# Patient Record
Sex: Female | Born: 1935 | Race: White | Hispanic: No | Marital: Married
Health system: Southern US, Community
[De-identification: ages and names within clinical notes are randomized; demographics above are authoritative.]

## PROBLEM LIST (undated history)

## (undated) DIAGNOSIS — N200 Calculus of kidney: Secondary | ICD-10-CM

## (undated) DIAGNOSIS — I5032 Chronic diastolic (congestive) heart failure: Secondary | ICD-10-CM

## (undated) DIAGNOSIS — T4145XA Adverse effect of unspecified anesthetic, initial encounter: Secondary | ICD-10-CM

## (undated) DIAGNOSIS — I712 Thoracic aortic aneurysm, without rupture, unspecified: Secondary | ICD-10-CM

## (undated) DIAGNOSIS — N390 Urinary tract infection, site not specified: Secondary | ICD-10-CM

## (undated) DIAGNOSIS — I739 Peripheral vascular disease, unspecified: Secondary | ICD-10-CM

## (undated) DIAGNOSIS — F32A Depression, unspecified: Secondary | ICD-10-CM

## (undated) DIAGNOSIS — T8859XA Other complications of anesthesia, initial encounter: Secondary | ICD-10-CM

## (undated) DIAGNOSIS — D649 Anemia, unspecified: Secondary | ICD-10-CM

## (undated) DIAGNOSIS — R112 Nausea with vomiting, unspecified: Secondary | ICD-10-CM

## (undated) DIAGNOSIS — E662 Morbid (severe) obesity with alveolar hypoventilation: Secondary | ICD-10-CM

## (undated) DIAGNOSIS — N1330 Unspecified hydronephrosis: Secondary | ICD-10-CM

## (undated) DIAGNOSIS — I219 Acute myocardial infarction, unspecified: Secondary | ICD-10-CM

## (undated) DIAGNOSIS — E038 Other specified hypothyroidism: Secondary | ICD-10-CM

## (undated) DIAGNOSIS — I10 Essential (primary) hypertension: Secondary | ICD-10-CM

## (undated) DIAGNOSIS — Z9889 Other specified postprocedural states: Secondary | ICD-10-CM

## (undated) DIAGNOSIS — G4730 Sleep apnea, unspecified: Secondary | ICD-10-CM

## (undated) DIAGNOSIS — G9340 Encephalopathy, unspecified: Secondary | ICD-10-CM

## (undated) DIAGNOSIS — E039 Hypothyroidism, unspecified: Secondary | ICD-10-CM

## (undated) DIAGNOSIS — F329 Major depressive disorder, single episode, unspecified: Secondary | ICD-10-CM

## (undated) DIAGNOSIS — J449 Chronic obstructive pulmonary disease, unspecified: Secondary | ICD-10-CM

## (undated) DIAGNOSIS — I2510 Atherosclerotic heart disease of native coronary artery without angina pectoris: Secondary | ICD-10-CM

## (undated) HISTORY — DX: Calculus of kidney: N20.0

## (undated) HISTORY — PX: CORONARY STENT PLACEMENT: SHX1402

## (undated) HISTORY — DX: Other specified hypothyroidism: E03.8

## (undated) HISTORY — DX: Hypothyroidism, unspecified: E03.9

## (undated) HISTORY — PX: TONSILLECTOMY: SUR1361

## (undated) HISTORY — DX: Encephalopathy, unspecified: G93.40

## (undated) HISTORY — DX: Thoracic aortic aneurysm, without rupture, unspecified: I71.20

## (undated) HISTORY — DX: Morbid (severe) obesity with alveolar hypoventilation: E66.2

## (undated) HISTORY — DX: Chronic obstructive pulmonary disease, unspecified: J44.9

## (undated) HISTORY — PX: APPENDECTOMY: SHX54

## (undated) HISTORY — DX: Anemia, unspecified: D64.9

## (undated) HISTORY — PX: REPLACEMENT TOTAL KNEE BILATERAL: SUR1225

## (undated) HISTORY — DX: Thoracic aortic aneurysm, without rupture: I71.2

---

## 1999-03-27 ENCOUNTER — Other Ambulatory Visit: Admission: RE | Admit: 1999-03-27 | Discharge: 1999-03-27 | Payer: Self-pay | Admitting: Cardiology

## 2000-12-25 ENCOUNTER — Encounter: Payer: Self-pay | Admitting: Specialist

## 2000-12-31 ENCOUNTER — Inpatient Hospital Stay (HOSPITAL_COMMUNITY): Admission: RE | Admit: 2000-12-31 | Discharge: 2001-01-08 | Payer: Self-pay | Admitting: Specialist

## 2001-01-03 ENCOUNTER — Encounter: Payer: Self-pay | Admitting: Specialist

## 2001-01-08 ENCOUNTER — Inpatient Hospital Stay (HOSPITAL_COMMUNITY)
Admission: RE | Admit: 2001-01-08 | Discharge: 2001-01-12 | Payer: Self-pay | Admitting: Physical Medicine & Rehabilitation

## 2001-12-04 ENCOUNTER — Inpatient Hospital Stay (HOSPITAL_COMMUNITY): Admission: EM | Admit: 2001-12-04 | Discharge: 2001-12-09 | Payer: Self-pay | Admitting: Emergency Medicine

## 2001-12-29 ENCOUNTER — Encounter (HOSPITAL_COMMUNITY): Admission: RE | Admit: 2001-12-29 | Discharge: 2002-03-29 | Payer: Self-pay | Admitting: Cardiology

## 2002-04-14 ENCOUNTER — Encounter (HOSPITAL_COMMUNITY): Admission: RE | Admit: 2002-04-14 | Discharge: 2002-07-13 | Payer: Self-pay | Admitting: Cardiology

## 2002-04-23 ENCOUNTER — Inpatient Hospital Stay (HOSPITAL_COMMUNITY): Admission: EM | Admit: 2002-04-23 | Discharge: 2002-04-24 | Payer: Self-pay | Admitting: Emergency Medicine

## 2002-04-23 ENCOUNTER — Encounter: Payer: Self-pay | Admitting: Emergency Medicine

## 2002-06-02 ENCOUNTER — Emergency Department (HOSPITAL_COMMUNITY): Admission: EM | Admit: 2002-06-02 | Discharge: 2002-06-02 | Payer: Self-pay | Admitting: Emergency Medicine

## 2002-08-08 ENCOUNTER — Encounter (HOSPITAL_COMMUNITY): Admission: RE | Admit: 2002-08-08 | Discharge: 2002-11-06 | Payer: Self-pay | Admitting: Cardiology

## 2002-11-08 ENCOUNTER — Encounter (HOSPITAL_COMMUNITY): Admission: RE | Admit: 2002-11-08 | Discharge: 2003-02-06 | Payer: Self-pay | Admitting: Cardiology

## 2003-02-08 ENCOUNTER — Encounter (HOSPITAL_COMMUNITY): Admission: RE | Admit: 2003-02-08 | Discharge: 2003-05-09 | Payer: Self-pay | Admitting: Cardiology

## 2003-05-11 ENCOUNTER — Encounter (HOSPITAL_COMMUNITY): Admission: RE | Admit: 2003-05-11 | Discharge: 2003-08-09 | Payer: Self-pay | Admitting: Cardiology

## 2003-08-10 ENCOUNTER — Encounter (HOSPITAL_COMMUNITY): Admission: RE | Admit: 2003-08-10 | Discharge: 2003-11-08 | Payer: Self-pay | Admitting: Cardiology

## 2003-09-10 ENCOUNTER — Inpatient Hospital Stay (HOSPITAL_COMMUNITY): Admission: RE | Admit: 2003-09-10 | Discharge: 2003-09-15 | Payer: Self-pay | Admitting: Specialist

## 2004-03-16 ENCOUNTER — Encounter (HOSPITAL_COMMUNITY): Admission: RE | Admit: 2004-03-16 | Discharge: 2004-06-14 | Payer: Self-pay | Admitting: Cardiology

## 2004-07-11 ENCOUNTER — Encounter (HOSPITAL_COMMUNITY): Admission: RE | Admit: 2004-07-11 | Discharge: 2004-10-09 | Payer: Self-pay | Admitting: Cardiology

## 2004-10-10 ENCOUNTER — Encounter (HOSPITAL_COMMUNITY): Admission: RE | Admit: 2004-10-10 | Discharge: 2005-01-02 | Payer: Self-pay | Admitting: Cardiology

## 2008-01-07 ENCOUNTER — Encounter: Admission: RE | Admit: 2008-01-07 | Discharge: 2008-01-07 | Payer: Self-pay | Admitting: Otolaryngology

## 2008-01-21 ENCOUNTER — Ambulatory Visit (HOSPITAL_COMMUNITY): Admission: RE | Admit: 2008-01-21 | Discharge: 2008-01-22 | Payer: Self-pay | Admitting: Otolaryngology

## 2010-01-10 ENCOUNTER — Encounter (HOSPITAL_BASED_OUTPATIENT_CLINIC_OR_DEPARTMENT_OTHER): Admission: RE | Admit: 2010-01-10 | Discharge: 2010-02-14 | Payer: Self-pay | Admitting: General Surgery

## 2010-04-30 ENCOUNTER — Encounter: Payer: Self-pay | Admitting: Internal Medicine

## 2010-08-22 NOTE — Op Note (Signed)
NAME:  Laura [name], SANCHO NO.:  [id number]   MEDICAL RECORD NO.:  [id number]          PATIENT TYPE:  AMB   LOCATION:  SDS                          FACILITY:  [facility]   PHYSICIAN:  Antony Contras, MD     DATE OF BIRTH:  01-23-1930   DATE OF PROCEDURE:  01/21/2008  DATE OF DISCHARGE:                               OPERATIVE REPORT   PREOPERATIVE DIAGNOSES:  1. Zenker diverticulum.  2. Dysphagia.   POSTOPERATIVE DIAGNOSES:  1. Zenker diverticulum.  2. Dysphagia.   PROCEDURE:  Endoscopic Zenker diverticulotomy.   SURGEON:  Excell Seltzer. Jenne Pane, MD   ANESTHESIA:  General endotracheal anesthesia.   COMPLICATIONS:  None.   INDICATIONS:  The patient is a 77 year old white female who has a  several-month history of a gurgling sound in her throat and dysphagia  with regurgitation.  A barium swallow demonstrated a Zenker diverticulum  and she presents to the operating room for endoscopic management.   FINDINGS:  The diverticulum was well-defined endoscopically with food  debris within it.  The common wall was divided and stapled.   DESCRIPTION OF PROCEDURE:  The patient was identified in the holding  room and informed consent having been obtained including discussion of  risks, benefits, alternatives, the patient brought to the operating  suite, and put on the operative table in supine position.  Anesthesia  was induced, and the patient was intubated by anesthesia team without  difficulty.  The patient was given intravenous antibiotics during the  case.  The eyes were taped closed and the bed was turned 90 degrees from  anesthesia.  A tooth guard was placed.  The Weerda bivalved laryngoscope  was then inserted into the mouth and used to expose the upper esophagus.  Food debris and saliva were encountered and were suctioned.  It was  difficult to identify the natural esophagus, so the Weerda scope  was  removed and exchanged for a cervical esophagus scope.  With this, the  pouch was easily seen, but again the  lumen of the esophagus was  difficult to identify.  Thus, it was exchanged for a 7 esophagoscope,  which easily demonstrated the natural lumen of the esophagus as well as  the pouch.  This helped with localization.  The Weerda laryngoscope was  then reinserted and used to isolate the common wall between the pouch  and the esophagus and the scope was then opened and placed in suspension  on the Mayo stand using a Lewy arm.  In order to aid in retraction, a 2-  0 silk suture was then placed through the common wall on either side  using an endoscopic alligator forceps and was then used as stay sutures  out through the scope.  The endoscopic stapler was then placed in  through the scope and over the common wall and was then discharged  dividing the common wall with line of staples.  An additional staple  cartridge was used to extend the incision inferiorly.  After this, the  region was suctioned, photographs were made before and after  the division.  The stay sutures  were then removed and the Weerda  laryngoscope taken out of suspension and carefully removed from  patient's mouth.  The tooth guard was then removed and the patient was  turned back to anesthesia for wake up.  She was extubated, and moved to  the recovery room in stable condition.      Antony Contras, MD  Electronically Signed     DDB/MEDQ  D:  01/21/2008  T:  01/21/2008  Job:  [id number]

## 2010-08-25 NOTE — H&P (Signed)
NAME:  Laura [name], Laura [name]                    ACCOUNT NO.:  [id number]   MEDICAL RECORD NO.:  [id number]                   PATIENT TYPE:  EMS   LOCATION:  MAJO                                 FACILITY:  [facility]   PHYSICIAN:  Darden Palmer., M.D.         DATE OF BIRTH:  1929/09/11   DATE OF ADMISSION:  04/23/2002  DATE OF DISCHARGE:                                HISTORY & PHYSICAL   REASON FOR ADMISSION:  1. Diaphoresis, chest discomfort.  2. Rule out myocardial infarction.   HISTORY:  The patient is a 77 year old female who has a previous history of  coronary artery disease.  She had an acute inferior myocardial infarction on  December 04, 2001 when she had an episode of prolonged chest discomfort with  radiation to the arm.  After arrival to the ER, she went into ventricular  fibrillation.  She had acute angioplasty of the right coronary artery with a  3.5-mm stent and required placement of a 5.0 stent in the distal vessel for  a thrombus.  She has done well since then and has been in the maintenance  rehab program but has not been able to lose any weight.  Last week, she had  an episode of dizziness when she stood up rapidly where she slumped to the  floor but did not have chest pain and did not have actual syncope.  She had  eaten some cheese this morning and was getting ready to go to the  maintenance rehab program when she had the onset of nausea and some mild  diaphoresis.  She did not have any of the previous chest discomfort or upper  tingling in her arms that she had with the previous MI, but became  frightened, took a nitroglycerin and had some relief of her symptoms.  EMS  was called, and she was noted to be somewhat tachycardic when they arrived.  She had some mild palpitations, was brought to the ER, and currently feels  well.  She had some ST depression noted on EKG and is admitted to rule out  MI.   PAST MEDICAL HISTORY:  Medical:  She has longstanding  hypertension.  She has  a prior history of hyperlipidemia and obesity.  She had an episode of  pneumonia as an outpatient.   Previous surgery:  Appendectomy, tonsillectomy, and left ankle fixation in  1996.  Knee surgery in September of 2002.   ALLERGIES:  ACE INHIBITORS cause cough.  She is intolerant to CODEINE.   CURRENT MEDICATIONS:  1. Aspirin 81 mg daily.  2. Plavix 75 mg daily.  3. Metoprolol 50 mg b.i.d.  4. Diovan 160 mg daily.  5. Lipitor daily.  6. Occasionally takes Zoloft 25 mg daily.   FAMILY HISTORY:  Father died of coronary disease at age 70.  Mother died at  age 2 with diabetes.   SOCIAL HISTORY:  Married with two adult children.  She volunteers at [employer].  Denies alcohol or tobacco abuse and is happily married.   REVIEW OF SYSTEMS:  She has been obese for several years.  She has had some  mild insomnia and some mild depression previously.  She has been unable to  lose any significant amounts of weight since she was previous.  She denies  any normal gastrointestinal problems, no GU problems.  Does have some mild  arthritis.  Other than noted above, the remainder of the review of systems  is unremarkable.   PHYSICAL EXAMINATION:  GENERAL:  She is a pleasant, obese female and is  currently in no acute distress.  VITAL SIGNS:  Her blood pressure is currently 140/80, her pulse is 80.  SKIN:  Her skin is warm and dry without mass or lesions.  ENT:  EOMI, PERRLA, sclerae clear, fundi unremarkable, pharynx negative.  NECK:  No masses, JVD, thyromegaly or bruits.  She is very obese.  LUNGS:  Clear to A&P.  CARDIAC EXAMINATION:  Normal S1, S2, no S3 or S4 or murmur.  ABDOMEN:  Soft, nontender, no masses, hepatosplenomegaly or aneurysm.  EXTREMITIES:  Distal pulses are 2+.  There is no edema.   A 12-lead ECG shows diffuse ST depression.  CBC showed a hemoglobin of 13.5,  hematocrit 40.1, white count of 8,200.  Stat did not show any significant   abnormalities.   IMPRESSION:  1. Diaphoresis and mild nausea, rule out ischemic equivalent, relieved with     nitroglycerin, rule out significant arrhythmias.  2. Coronary artery disease with previous inferior myocardial infarction in     August of 2003.  3. Hypertension.  4. Obesity.  5. Hyperlipidemia.   RECOMMENDATIONS:  1. Begin Lovenox.  2. Increase beta blocker to 100 b.i.d.  3. Rule out MI with serial cardiac enzymes.  4. Check follow-up EKG.                                               Darden Palmer., M.D.    WST/MEDQ  D:  04/23/2002  T:  04/23/2002  Job:  [id number]   cc:   Loraine Leriche A. Waynard Edwards, M.D.  [address]  [place]  [place] [address]  Fax: [phone]

## 2010-08-25 NOTE — Consult Note (Signed)
NAME:  Laura [name], Laura [name]                    ACCOUNT NO.:  [id number]   MEDICAL RECORD NO.:  [id number]                   PATIENT TYPE:  INP   LOCATION:  2001                                 FACILITY:  [facility]   PHYSICIAN:  Serena Colonel, M.D.                   DATE OF BIRTH:  [birth year]   DATE OF CONSULTATION:  12/04/2001  DATE OF DISCHARGE:                                   CONSULTATION   REASON FOR CONSULTATION:  Tongue swelling and hematoma.   HISTORY:  This is a 78 year old who was admitted earlier in the day  following an acute myocardial infarction, who has undergone successful  intervention and is currently on strong intravenous platelet inhibitors.  She was noted sometime after the procedure to have bruising and swelling of  her tongue and some ecchymosis of the submental area.  She does not recall  having bitten or fallen or hurt herself, but she was likely unconscious at  the time.  She is having no difficulty breathing or swallowing.   PHYSICAL EXAMINATION:  GENERAL:  She is a pleasant lady who appears to be in  no distress.  She is awake and alert, able to answer all questions with  clear speech.  She is having no difficulty breathing and describes no  difficulty swallowing.  HEENT:  Examination reveals some ecchymosis of the left submental area  without any swelling or firmness to suggest an expanding hematoma.  In the  oral cavity, there is what appears to be a bite wound of the dorsal aspect  of the tongue, mainly on the right side, with some denuded epithelium.  There is minimal swelling but significant ecchymosis.  The floor of the  mouth has mild ecchymosis but no signs of expanding hematoma such as  firmness or tenderness.  The oropharynx has normal appearance without any  evidence of swelling or obstruction.  The remainder of the examination is  unremarkable.   IMPRESSION:  Tongue wound/hematoma with small hematoma of the submental  skin.  I am not sure if  this is blood that settled from the tongue injury or  if she may have suffered a small trauma to the jaw area.  In either case,  there is no evidence of expanding hematoma and there is no bleeding.  The  denuded epithelium will eventually slough off of the dorsum of the tongue  and will eventually heal.  She is really not suffering any discomfort.   RECOMMENDATION:  I recommend to Dr. Georga Hacking that they go ahead and  resume the platelet-inhibiting medication.  Contact me if there is any  evidence of expansion or any difficulty breathing or swallowing.  Otherwise,  I suspect this will completely resolve on its own.  Serena Colonel, M.D.    Dorene Grebe  D:  12/05/2001  T:  12/08/2001  Job:  [id number]

## 2010-08-25 NOTE — Discharge Summary (Signed)
[facility]  Patient:    Laura [name], Laura [name] Visit Number: [id number] MRN: [id number]          Service Type: SUR Location: 4W 0482 01 Attending Physician:  Erasmo Leventhal Dictated by:   Ralene Bathe, P.A. Admit Date:  12/31/2000 Disc. Date: 01/08/01                             Discharge Summary  ADMISSION DIAGNOSES:  1. End-stage osteoarthritis, left knee.  2. Hyperlipidemia.  3. Obesity.  4. History of pneumonia.  5. Mild elevated blood pressures but no current medications.  DISCHARGE DIAGNOSES:  1. End-stage osteoarthritis, left knee.  2. Hyperlipidemia.  3. Obesity.  4. History of pneumonia.  5. Mild elevated blood pressures but no current medications.  6. Status post left total knee arthroplasty.  7. Postoperative nausea and vomiting.  8. Postoperative Achilles and ankle tendinitis.  OPERATION:  Left total knee arthroplasty -- surgeon is Dr. Benny Lennert and assistant, Dorie Rank, P.A. -- under general anesthesia.  BRIEF HISTORY:  The patient is a 77 year old female well-known to our practice.  She has end-stage left knee osteoarthritis and has failed conservative measures including arthroscopic washout and anti-inflammatories. At this time, she wishes to proceed with left total knee arthroplasty as indicated.  Risks and benefits were discussed in detail and she wishes to proceed.  HOSPITAL COURSE:  Patient was admitted and underwent the above-named procedure and tolerated this well.  All appropriate IV antibiotics and analgesics were utilized.  Postoperatively, she was put on DVT and PE prophylaxis with Coumadin.  Postoperatively, she had a significant amount of nausea and vomiting that lasted 48 to 72 hours and bowel regimen and precautions were utilized.  She progressed slowly and was able to be weaned to p.o.s by postoperative day 3 at full liquids and postoperative day 4 on solids.  She did have some mild confusion  related to narcotics.  She had also some significant bilateral ankle pain where she had previous left ankle fracture as well as some right ankle discomfort that were worked up and found to be probable tendinitis and soft tissue in nature.  She was begun on Bextra with good result and placed in an ASO to the left ankle for support.  All-in-all, she had a low postoperative course due to these complications, however, did extremely well.  It was felt that she would require further inpatient stay to increase her therapies and to stabilize medical conditions prior to discharge to home.  A rehab consult was ordered and she was felt to be an appropriate candidate.  On January 16, 2001, she was afebrile, her vital signs were stable and neurovascularly, she was intact.  Her knee incision was clean and dry without any drainage.  Her GI status had improved and she was tolerating pills, p.o.s and had positive bowel movements without difficulty.  She was voiding without difficulty.  All-in-all, she was stable for transfer to rehab unit to continue therapies.  LABORATORY DATA:  Urine culture:  No growth.  Pro times followed by pharmacy, on Coumadin.  Preoperative hemoglobin 14, postoperatively at 10.2 and 9.2. Chemistries within normal limits postoperatively on three lab draws. Urinalysis on admission with moderate leukocyte esterase, postoperatively trace, and another postoperative was a normal urine with a normal culture.  X-ray shows no acute bony abnormalities in the left ankle, minimal degenerative changes noted, plate and screw fixation also noted.  Preoperative  chest film showed cardiomegaly and tricompartmental arthritis of the operative knee.  EKG:  She had borderline EKG, normal sinus rhythm.  CONDITION ON DISCHARGE:  Stable and improved.  DISCHARGE PLAN:  Patient is being discharged to [facility] where they will continue to monitor her medications and her therapies.  She  is weightbearing as tolerated with left total knee replacement protocol.  CURRENT MEDICATIONS:  1. Colace 100 mg b.i.d.  2. Trinsicon one t.i.d.  3. Atenolol 50 mg q.d.  4. Pepcid 20 mg b.i.d.  5. Laxative and enema of choice.  6. Phenergan and Reglan p.r.n.  7. Coumadin per pharmacy.  8. Robaxin 500 mg q.8h. p.r.n.  9. Bextra 20 mg q.d. 10. Darvocet-N 100 one to two every four to six p.r.n. pain.  FOLLOWUP:  We will continue to follow her there p.r.n.  Please call for any problems. Dictated by:   Ralene Bathe, P.A. Attending Physician:  Erasmo Leventhal DD:  01/08/01 TD:  01/08/01 Job: [id number]

## 2010-08-25 NOTE — H&P (Signed)
NAME:  Laura [name], Laura [name]                    ACCOUNT NO.:  [id number]   MEDICAL RECORD NO.:  [id number]                   PATIENT TYPE:  EMS   LOCATION:  MAJO                                 FACILITY:  [facility]   PHYSICIAN:  W. Ashley Royalty., M.D.         DATE OF BIRTH:  [birth year]   DATE OF ADMISSION:  12/04/2001  DATE OF DISCHARGE:                                HISTORY & PHYSICAL   HISTORY OF PRESENT ILLNESS:  This 77 year old female is being evaluated for  an acute myocardial infarction. She has a prior history of hypertension and  possible hyperlipidemia and obesity.  She has previously been in fair health  except for surgery and hypertension and presented to Dr. Laurey Morale office  this morning with a 45 minute episode of prolonged chest discomfort with  radiation to the arm.  The symptoms were unrelieved in his office and she  was transferred to the [facility] Emergency Room by EMS.  After arrival  here, she went into ventricular fibrillation and required cardioversion.  She is in sinus rhythm now, is able to give her name and is complaining of  generalized pain after the cardioversion.  Most of the history was obtained  from the family except for that above because of the acute nature of the  situation.   PAST MEDICAL HISTORY:  This is remarkable for hypertension, possible  hyperlipidemia, and no known history of ulcers or diabetes.   PAST SURGICAL HISTORY:  She had left knee replacement, bilateral ankle  surgery, and appendectomy.   ALLERGIES:  CODEINE.   CURRENT MEDICATIONS:  These are unknown at this time.  Family does not know  them and we do not have records of current medications.   FAMILY HISTORY:  This is not able to be obtained at this time.   SOCIAL HISTORY:  She is a housewife, a nonsmoker, does not use alcohol to  excess, attends [employer] [address].  She is married and has at least two  daughters.   REVIEW OF SYSTEMS:  This is unobtainable at this  time.   PHYSICAL EXAMINATION:  GENERAL APPEARANCE:  She is an obese woman who is  mildly sedated following cardioversion but can give her name, complaining of  chest pain.  VITAL SIGNS:  Blood pressure is currently 120/80, pulse is currently 70 to  80 and regular.  SKIN:  Warm and dry.  There is a previous skin burn from the cardioversion  pad that the nurses had placed over the abdomen.  HEENT:  This is grossly normal.  Pharynx is negative.  NECK:  Supple without masses.  LUNGS:  The lungs are relatively clear.  CARDIOVASCULAR:  Normal S1 and S2, no S3.  ABDOMEN:  Soft and nontender, no hepatosplenomegaly grossly.  EXTREMITIES:  Peripheral pulses are 2+.   A 12-lead ECG shows an inferolateral ST elevation, ST depression in V2.    IMPRESSION:  1. Acute inferior myocardial infarction, complicated with early ventricular  fibrillation in the emergency room.  2. Hypertension.  3. Hyperlipidemia, possibly under treatment.  4. Obesity.  5. Osteoarthritis.   RECOMMENDATIONS:  The patient will be taken acutely to the cardiac  catheterization laboratory for possible catheterization angioplasty and  stenting.  The procedure was discussed fully with the patient's family  including risks of stroke, death, or recurrent arrhythmia and they are  willing to proceed.                                                Darden Palmer., M.D.    WST/MEDQ  D:  12/04/2001  T:  12/04/2001  Job:  [id number]   cc:   Loraine Leriche A. Perini, M.D.

## 2010-08-25 NOTE — Discharge Summary (Signed)
NAME:  Laura [name], Laura [name]                    ACCOUNT NO.:  [id number]   MEDICAL RECORD NO.:  [id number]                   PATIENT TYPE:  INP   LOCATION:  0467                                 FACILITY:  [facility]   PHYSICIAN:  Erasmo Leventhal, M.D.         DATE OF BIRTH:  1929-06-20   DATE OF ADMISSION:  09/10/2003  DATE OF DISCHARGE:  09/15/2003                                 DISCHARGE SUMMARY   ADMITTING DIAGNOSIS:  End-stage osteoarthritis, right knee.   DISCHARGE DIAGNOSIS:  End-stage osteoarthritis, right knee.   OPERATION:  Total knee arthroplasty, right knee.   BRIEF HISTORY:  This is a 77 year old lady with a history of osteoarthritis,  bilateral knees, with previous total knee arthroplasty of her left knee.  She has done very well from that, and now requests total knee arthroplasty  of the right knee due to failure of conservative measures to alleviate her  discomfort.  The surgery, risks, benefits, and aftercare were discussed in  detail with the patient, questions were invited and answered.  She had  medical clearance by Dr. Donnie Aho, her cardiologist, and Dr. Chilton Si, her  medical doctor, prior to surgery.  She is on Plavix and aspirin, and will  stop that 7 days prior to the surgery by their recommendation.   LABORATORY VALUES:  Admission CBC within normal limits.  Admission PT and  PTT showed the PT slightly low at 11.3, otherwise within normal limits.  Her  PT and INR was 21.2 with an INR of 24 by discharge.  Admission CMET showed  the glucose high at 125; otherwise normal.  She was mildly hyponatremic  during her postoperative period, but was corrected by discharge.  She ran  high sugars through her hospitalization.   COURSE IN THE HOSPITAL:  The patient tolerated the operative procedure well.  On the first postoperative day, vital signs were stable, she was afebrile,  hemoglobin and hematocrit were acceptable.  Dressing was dry.  Hemovac was  removed without  difficulty, and she was started on a CPM machine.  On the  second postoperative day, she continued to do well, vital signs were stable,  she was afebrile.  Hemoglobin and hematocrit were acceptable.  She was  slightly hyponatremic at 133.  Free water was restricted.  She was  discontinued on her PCA.  Foley was discontinued, and she was started on 81  mg aspirin until her Coumadin was therapeutic.  On the third postoperative  day, vital signs were stable.  She had a cough, and did have a history of  asthmatic bronchitis.  Otherwise, her vital signs were stable, she was  afebrile.  Dressing was changed.  The wound looked excellent.  The calves  were negative.  CBG's were coming down, and she was doing well.  Chest x-ray  was obtained due to her cough, and showed no active lung disease.  The  fourth postoperative day, the patient was feeling better but still had a  cough.  Vital signs were stable.  She was afebrile.  O2 saturations were 96.  Lungs had scattered rhonchi.  Bowel sounds were active.  Heart sounds were  normal.  Calves were negative.  Dressing was changed, and the wound was  benign.  Chest x-ray showed no active disease.  We increased her incentive  spirometry, decreased her narcotics, and asked the medical service to follow  her chest.  On the fifth postoperative day, she was feeling better with  still some slight congestion with vital signs stable, afebrile.  PT and INR  in the therapeutic range, and lungs sounding better with the wound benign.  The patient was discharged for follow up in the office.   CONDITION ON DISCHARGE:  Improved.   DISCHARGE MEDICATIONS:  1. Vicodin to use sparingly.  2. Robaxin.  3. Trinsicon.  4. Coumadin per pharmacy protocol.   DISCHARGE INSTRUCTIONS:  She is to do her home CPM, home physical therapy.   FOLLOW UP:  1. She is to see up back in 10 days.  2. Dr. Waynard Edwards in 10 days to 2 weeks.   She is to call if any problems or questions  arise.     Jaquelyn Bitter. Chabon, P.A.                   Erasmo Leventhal, M.D.    SJC/MEDQ  D:  09/22/2003  T:  09/22/2003  Job:  [id number]

## 2010-08-25 NOTE — Consult Note (Signed)
NAME:  Laura [name], Laura [name]                    ACCOUNT NO.:  [id number]   MEDICAL RECORD NO.:  [id number]                   PATIENT TYPE:  EMS   LOCATION:  MAJO                                 FACILITY:  [facility]   PHYSICIAN:  W. Ashley Royalty., M.D.         DATE OF BIRTH:  April 19, 1929   DATE OF CONSULTATION:  06/02/2002  DATE OF DISCHARGE:                                   CONSULTATION   EMERGENCY ROOM VISIT   REASON FOR CONSULTATION:  This 77 year old female is seen in the emergency  room following an episode of atrial fibrillation.  She had an acute inferior  MI December 04, 2001 with prolonged episode of chest discomfort with radiation  to the arm.  She had ventricular fibrillation in the ER and had acute  angioplasty with a 3.5 mm stent and required placement of a 5.0 stent in the  distal vessel for thrombus.  She has done well since that time but developed  nausea and diaphoresis while going to the maintenance rehab program and she  was brought to the emergency room and admitted to rule out an MI.  MI was  ruled out and she did well following that.  She was at church today  preparing a meal and noted that the room was very hot.  It happened to be  after a funeral that all this happened.  She then became nauseous, somewhat  diaphoretic, and took two nitroglycerin.  She felt like she might have to  vomit and EMS was called.  She had no chest pain at this time.  She was  brought to the emergency room and was found to be in rapid atrial  fibrillation and converted spontaneously back to normal sinus rhythm.  She  has a prior history of a recent upper respiratory infection and has been  treated with Advair as well as cough drops over the weekend.  She has not  been using any over-the-counter decongestants.   PAST MEDICAL HISTORY:  1. Longstanding hypertension.  2. Prior history of hyperlipidemia and obesity.  3. History of pneumonia as an outpatient.   PREVIOUS SURGERY:  1.  Appendectomy.  2. Tonsillectomy.  3. Ankle fixation.  4. Knee surgery in September 2002.   ALLERGIES:  ACE INHIBITOR causes cough.   CURRENT MEDICATIONS:  1. Aspirin daily.  2. Plavix 75 daily.  3. Metoprolol 100 in the morning, 50 in the evening.  4. Diovan 150 daily.  5. Lipitor daily.   FAMILY AND SOCIAL HISTORY:  Same as previous records.   PHYSICAL EXAMINATION:  GENERAL:  Pleasant obese female in no acute distress.  VITAL SIGNS:  Blood pressure is currently 130/80, pulse is currently 90.  SKIN:  Warm and dry.  HEENT:  EOMI, PERRLA, C&S clear.  Fundi unremarkable.  Pharynx negative.  NECK:  Supple without masses, JVD, thyromegaly, or bruits.  Significantly  obese.  LUNGS:  Clear to A&P.  CARDIAC:  Normal S1 and S2.  No S3 or murmur.  ABDOMEN:  Soft, nontender.  No masses, hepatosplenomegaly, or aneurysm.  Pulses 2+, no edema.   LABORATORY DATA:  A 12-lead ECG shows nonspecific S and T wave  abnormalities, sinus rhythm.   IMPRESSION:  1. Coronary artery disease with previous inferior myocardial infarction in     August 2003.  2. Atrial fibrillation - resolved.  3. Hypertension.  4. Obesity.   RECOMMENDATIONS:  Increase Lopressor to 100 mg twice daily.  She will likely  need to have an outpatient echocardiogram.  I will check a TSH on her.  She  may need to be anticoagulated with Coumadin.                                               Darden Palmer., M.D.    WST/MEDQ  D:  06/02/2002  T:  06/02/2002  Job:  [id number]   cc:   Loraine Leriche A. Waynard Edwards, M.D.  [address]  [place]  [place] [address]  Fax: [phone]

## 2010-08-25 NOTE — Discharge Summary (Signed)
NAME:  Laura [name], Laura [name]                    ACCOUNT NO.:  [id number]   MEDICAL RECORD NO.:  [id number]                   PATIENT TYPE:  INP   LOCATION:  2035                                 FACILITY:  [facility]   PHYSICIAN:  Darden Palmer., M.D.         DATE OF BIRTH:  06-07-1929   DATE OF ADMISSION:  04/23/2002  DATE OF DISCHARGE:  04/24/2002                                 DISCHARGE SUMMARY   FINAL DIAGNOSES:  1. Diaphoresis and atypical chest pain, myocardial infarction ruled out.  2. Coronary artery disease with previous stenting of the right coronary     artery.  3. History of hypertension.  4. Obesity.   HISTORY OF PRESENT ILLNESS:  This 77 year old female had an inferior  myocardial infarction December 04, 2001.  She had done well since that time  following acute angioplasty.  The week prior to admission she had an episode  of dizziness when she stood up rapidly where she slipped to the floor but  did not have chest pain or actual syncope.  She had eaten cheese the morning  of admission and was getting ready to go to the maintenance rehab program  when she had the onset of nausea and mild diaphoresis.  She did not have any  of her previous chest discomfort but became frightened, took the  nitroglycerin and had relief of her symptoms.  EMS was called and she was  somewhat tachycardic.  She was brought to the emergency room and felt well.  She was admitted to rule out an MI.  Please see the previously dictated  history and physical for the remainder of the details.   HOSPITAL COURSE:  Lab data on admission showed a hemoglobin of 13.5 and  hematocrit 40.1.  PT and PTT were normal.  Glucose 123, BUN 32, creatinine  0.8, calcium 10.6.  Hemoglobin the next morning was 13.1.  Her troponins  were all completely normal.  CPK was 110 with an MB of 6.5 and MB remained  slightly elevated.  Cholesterol panel showed a cholesterol of 200,  triglycerides 140, HDL 45, LDL 127.  She  was placed on Lovenox and had MI  ruled out. I thought her symptoms were somewhat atypical and it was thought  in light of her symptoms that we would discharge her and follow up as an  outpatient.  Her metoprolol was increased to 100 mg daily.   CONDITION ON DISCHARGE:  She was discharged in improved condition.   DISCHARGE MEDICATIONS:  1. Metoprolol 100 mg in the morning and 50 mg at night.  2. Plavix 75 mg daily.  3. Aspirin 81 mg daily.  4. Lipitor 10 mg daily.  5. Diovan 160 mg daily.    DISCHARGE INSTRUCTIONS:  She is to walk and call if there are further  problems.   FOLLOW UP:  We will see her in follow-up in one week.  Darden Palmer., M.D.    WST/MEDQ  D:  05/13/2002  T:  05/13/2002  Job:  [id number]   cc:   Loraine Leriche A. Waynard Edwards, M.D.  [address]  [place]  [place] [address]  Fax: [phone]

## 2010-08-25 NOTE — Cardiovascular Report (Signed)
NAME:  Laura [name], Laura [name]                    ACCOUNT NO.:  [id number]   MEDICAL RECORD NO.:  [id number]                   PATIENT TYPE:  INP   LOCATION:  1826                                 FACILITY:  [facility]   PHYSICIAN:  W. Ashley Royalty., M.D.         DATE OF BIRTH:  [birth year]   DATE OF PROCEDURE:  12/04/2001  DATE OF DISCHARGE:                              CARDIAC CATHETERIZATION   HISTORY OF PRESENT ILLNESS:  This 77 year old female has a history of  hypertension, hyperlipidemia and presented with an acute inferior MI with  the onset of chest pain approximately 10:30-11 a.m. She was seen at Dr.  Laurey Morale office and was given aspirin and taken to the [facility] Emergency Room.  She had a ventricular fibrillatory arrest in the emergency room and was  successfully resuscitated.  She was brought to the catheterization lab and  was stable.   DESCRIPTION OF PROCEDURE:  The patient was prepped and draped in the usual  manner. After Xylocaine anesthesia, a 7 French sheath was placed in the  right femoral artery percutaneously. A 6 French sheath was placed in the  right femoral vein. A 5 French bipolar pacing catheter was then placed in  the right ventricular apex. Angiograms were made using 6 French catheters  and a 30 cc ventriculogram was performed. She had minimal disease involving  the left coronary system and was found to have a total occlusion of the  right coronary artery with collaterals from the left coronary system.  Arrangements were then made to do angioplasty.  She had previously been  given heparin in the emergency room. ACT was 308. Integrilin was begun with  double bolus and infusion technique. Angioplasty equipment used is a 7  [place] JR4 guiding catheter, HTF-J guide wire which crossed the lesion  easily. The lesion was pre-dilated with a 2.5 x 15 mm CrossSail balloon. A  3.5 x 23 mm Zeta stent was then deployed in the mid right coronary artery  and dilated to 16  atmospheres. This resulted in complete expansion of the  lesion and a good angiographic result. Following this, however, was an area  of linear lucency and opacity, which looked like a dissection. I then  performed intravascular ultrasound which showed there to be no intimal  disruption, but rather a linear thrombosis that extended from the inferior  portion of the stent down into the distal vessel. After review of the IVUS  and after consideration, it was elected to place a second stent to try to  trap the thrombosis and reduce the chance of distal embolization. A 5 mm x  20 mm Express II Scimed stent was placed across the lesion and was inflated  to a nominal pressure, which resulted in obliteration of the angiographic  appearance of thrombosis and an excellent angiographic result. She tolerated  the procedure well without complications and the sheath was sutured in place  and she was returned to the coronary care unit  in stable condition.   HEMODYNAMIC DATA:  Aorta post-contrast 158/95, LV post-contrast 158/21.   ANGIOGRAPHIC DATA:   LEFT VENTRICULOGRAM:  Left ventriculogram performed in the 30-degree RAO  projection. Aortic valve was normal. The mitral valve is normal. The left  ventricle appears normal in size.  There was inferior hypokinesis which was  is severe present. Estimated EF was 50%.  Coronary arteries arise and  distribute normally.   The left main coronary artery appears normal.   Left anterior descending contains only scattered irregularities and extends  to the apex. A large diagonal branch is present which is free of disease.   The circumflex marginal branch has two marginal arteries that contain  scattered irregularities but is free of disease.   The right coronary artery is occluded in its proximal portion and fills by  collaterals from the left coronary system. Post-dilatation of the right  coronary artery reveals the vessel to be widely patent with TIMI grade  3  flow. The proximal stent is well deployed and the distal stent is now well  deployed with 0% residual stenosis. There is some narrowing from a smaller  stent into the larger stents as a result of the stent mismatch.   Intravascular ultrasound:  The distal vessel is relatively free of disease  and appears to be a 5.0 x 5.0 vessel. Then 15 mm distal to the previously  placed stent is an area of mobile thrombosis which is linear and originates  from the distal edge of the previous stent.  The previous stent is well  opposed with internal dimensions of approximately 4.0 x 4.0. This stent  appeared to be well opposed throughout its length.   IMPRESSION:  1. Successful percutaneous transluminal coronary angioplasty and stenting of     the right coronary artery with stenosis going from 100% to 0%.  2. Acute inferior infarction with inferior wall hypokinesis.     A. Minimal disease involving the left coronary system.                                                   Darden Palmer., M.D.    WST/MEDQ  D:  12/04/2001  T:  12/06/2001  Job:  [id number]   cc:   Loraine Leriche A. Perini, M.D.

## 2010-08-25 NOTE — Op Note (Signed)
NAME:  Laura [name], Laura [name]                    ACCOUNT NO.:  [id number]   MEDICAL RECORD NO.:  [id number]                   PATIENT TYPE:  INP   LOCATION:  0467                                 FACILITY:  [facility]   PHYSICIAN:  Erasmo Leventhal, M.D.         DATE OF BIRTH:  06-Nov-1929   DATE OF PROCEDURE:  09/10/2003  DATE OF DISCHARGE:                                 OPERATIVE REPORT   PREOPERATIVE DIAGNOSIS:  Right knee end-stage osteoarthritis with  significant valgus malalignment.   POSTOPERATIVE DIAGNOSIS:  Right knee end-stage osteoarthritis with  significant valgus malalignment.   PROCEDURE:  Right total knee arthroplasty.   SURGEON:  Elana Alm. Thomasena Edis, M.D.   ASSISTANT:  Jodene Nam, PA-C   ANESTHESIA:  General with femoral nerve block.   ESTIMATED BLOOD LOSS:  Less than 50 mL.   DRAINS:  Two mini Hemovac.   COMPLICATIONS:  None.   TOURNIQUET TIME:  1 hour and 30 minutes at 350 mmHg.   OPERATIVE IMPLANTS:  Osteonics components, Scorpion all cemented.  Sizes 7  femur, 7 tibia, 12 mm flex tibial insert, and a 26 mm patella.   OPERATIVE DETAILS:  The patient was counseled in the holding area, and  correct site was identified.  IV was started.  Antibiotics were given.  Chart was reviewed and signed appropriately.  Taken to the OR, placed under  general anesthesia.  Foley catheter placed without utilizing sterile  technique by the OR circulating nurse.  All extremities were well-padded and  bumped.  The right lower extremity was examined, 5-degree flexure  contracture.  She could flex to 130 degrees.  She was in marked valgus  malalignment.  She was elevated, prepped with DuraPrep, and all was draped  in a sterile fashion, exsanguinated with an Esmarch.  Tourniquet was  inflated to 350 mmHg.  A straight midline incision made through the skin and  subcutaneous tissue, small veins electrocoagulated.  Medial and lateral soft  tissue flaps were developed at the  appropriate level.  Medial parapatellar  arthrotomy was performed and a minimal of proximal tibial release was done  medially.  The patella was everted.  The knee was flexed.  She had end-stage  osteoarthritic changes, bone against bone and a significant defect in the  lateral tibial plateau.  Cruciate ligaments were resected.  The osteophytes  were removed, a starting hole made in the distal femur; canal was irrigated.  The effluent was clear.  Intramedullary awl was gently placed.  We chose a  10-degree valgus cut due to the fact she was in so much valgus  preoperatively and then took a 10 mm cut off the distal femur, and the  distal femur was found to be a size 7.  Rotational marks were made, and the  distal femur was cut to fit a size #7.  Medial and lateral menisci were  removed under direct visualization.  Geniculate vessels were coagulated.  The posterior neurovascular structures were tied  off and protected  throughout the entire case.  She was found to be a size 7 tibia.  Starting  hole was made.  Step reamer was utilized.  Intramedullary canal was  irrigated until the effluent was clear.  Intramedullary rod was gently  dropped into the canal carefully.  We chose a 0-degree slope.  We took a 2  mm based upon the defect on the lateral side which gave an excellent  resection of the proximal tibia.  Posteromedial and posterolateral  osteophytes were removed under direct visualization.  Femoral trochlea was  prepared in a standard fashion.  At this point in time, with the size 7  femur, size 7 tibia with a 10 mm flex insert with excellent range of motion;  however, she was tight laterally.  This was expected based at this point in  time the IT band was released partially off its insertion to recess the IT  band.  This helped, and then the popliteus was released off the femur and  laterally to the recess.  The collateral ligament was left intact.  At this  point in time, after removing  all the lateral osteophytes and removing some  of the soft tissue, she was well-balanced in flexion and extension and varus  and valgus stress.  Patellofemoral tracking was also the accomplished and  did not require a lateral release.  Rotation marks were made on the tibia,  and the Delta keel was performed in the standard fashion.  The patella was  found to be a size 26, reamed to the appropriate depth.  Locking holes were  made.  At this point in time, utilizing modern cement technique, all  components were cemented in place, size 7 tibia, size 7 femur, 26 patella.  After the cement had cured, we did 10 and 12 mm thick insert with a tibial  insert at this point in time were well-balanced in flexion and extension.  Patellofemoral tracking was anatomic.  Tibial trial was removed; excess  cement was removed, bone wax placed on exposed bony surfaces, and the final  12 mm tibial insert was tapped into position.  Knee was well-balanced, no  complications or problems.   The knee was then irrigated with antibiotic solution on closure.  Two mini  Hemovac drains were placed.  Sequential closure in layers done, arthrotomy  with Vicryl, subcu Vicryl, skin closed with subcu Monocryl suture.  Steri-  Strips were applied.  Drain was then hooked to suction.  Tourniquet was  deflated.  Normal circulation in the foot and ankle at the end of the case.   At this time, the femoral nerve block was then administered.  She will be  awakened from anesthesia.  She was given another gram of Ancef  intravenously, and the tourniquet was deflated.  She is awakened and taken  from the operating room to PACU in stable condition.   In the PACU, she will be carefully checked to make sure there is no  complication in her nerve function or difficulties due to the fact that she  did have the superior valgus deformity.  If there are, these will be addressed and be included by standard fashion.   To decrease surgical time  and help throughout this entire difficult  procedure, Mr. Brett Canales Chabon's assistance was needed.  Erasmo Leventhal, M.D.    RAC/MEDQ  D:  09/10/2003  T:  09/10/2003  Job:  [id number]

## 2010-08-25 NOTE — H&P (Signed)
[facility]  Patient:    Laura [name], Laura [name] Visit Number: [id number] MRN: [id number]          Service Type: Attending:  R. Valma Cava, M.D. Dictated by:   Irena Cords, P.A.-C. Adm. Date:  12/31/00                           History and Physical  DATE OF BIRTH:  06/25/1929. SOCIAL SECURITY NUMBER:  [id number].  CHIEF COMPLAINT:  Left knee pain.  HISTORY OF PRESENT ILLNESS:  Laura [name] is a 77 year old female who presents with complaints of persistent and worsening left knee pain.  She has had daily pain that is now interfering with some of her activities of daily living.  She underwent knee arthroscopy without relief in February.  She has also had a series of Synvisc injections, which have given her no relief as well.  She had tried cortisone injections as well as various anti-inflammatory medications without change.  Because of the persistent and worsening symptoms and failure of conservative measures, she has elected to undergo a total knee replacement.  REVIEW OF SYSTEMS:  She denies diplopia, blurred vision, or headaches.  No rhinorrhea, sore throat, or earache.  No chest pain, shortness of breath, or cough.  No abdominal pain, nausea, vomiting, diarrhea, or constipation.  No melena or bright red blood per rectum.  No dysuria, urinary frequency, or hematuria.  No numbness or tingling in her extremities.  PAST MEDICAL HISTORY:  Significant for elevated blood pressure, hyperlipidemia, obesity, and one episode of pneumonia that was treated as an outpatient in 2000.  Also of note, she becomes easily nauseated after anesthesia.  She denies a history of diabetes, heart, lung, kidney, or ulcer disease.  Denies stroke, seizures, or cancer.  ALLERGIES:  No known drug allergies.  She is intolerant to CODEINE.  MEDICATIONS:  Vicodin, Mobic (which she will be stopping before surgery), and she thinks they started her on HCTZ 12.5 mg a  day.  PAST SURGICAL HISTORY:  Appendectomy, tonsillectomy, and left ankle ORIF in 1996.  FAMILY HISTORY:  Significant in that her father died at age 34 of coronary artery disease and mother died at age 6 of diabetes.  SOCIAL HISTORY:  Patient is married, has two adult children, who are alive and well.  Patient has two steps leading into her home, and she lives on the main floor.  She volunteers at [employer].  She denies tobacco or alcohol use.  PHYSICAL EXAMINATION:  VITAL SIGNS:  Pulse 76, respiratory rate 12, blood pressure 120/80, weight 208 pounds.  GENERAL:  A well-developed, well-nourished female in no acute distress.  She walks with an antalgic gait with the assistance of a cane.  HEENT:  Head is atraumatic, normocephalic.  Pupils are equal, round and reactive to light, and extraocular movements are grossly intact.  Oropharynx is clear without redness, exudate, or lesions.  NECK:  Supple without cervical lymphadenopathy.  No carotid bruits.  CHEST:  Clear to auscultation bilaterally with no wheezes or crackles.  CARDIAC:  Regular rate and rhythm, no murmurs, rubs, or gallops.  ABDOMEN:  Soft, nontender, nondistended, with no masses and no hepatosplenomegaly.  BREASTS, GENITOURINARY:  Not examined, as not pertinent to present illness.  EXTREMITIES:  Left knee demonstrates a varus alignment.  She has a slight flexion contracture.  Flexes the knee to 120 degrees.  Diffusely tender along the medial joint space.  No collateral instability.  Motor and sensory and distal neurovascular exams are grossly intact.  2+ DP and PT pulses.  SKIN:  Intact without rashes or lesions.  LABORATORY DATA:  X-rays demonstrate significant bone-on-bone degenerative changes in the left knee in the medial compartment, most pronounced, as well as tricompartmental changes throughout that left knee.  ASSESSMENT: 1. Left knee osteoarthritis. 2. Elevated blood pressure. 3.  Hyperlipidemia. 4. Obesity.  PLAN:  Plan will be admitted to [facility] to undergo left total knee arthroplasty by Dr. Thomasena Edis on December 31, 2000.  We have received preoperative medical clearance from Dr. Waynard Edwards, patients medical physician, who felt she was medically cleared and stable for the surgery.  He noted that she may need possible control of her blood pressure and therefore has started her on a medication for this.  I have answered all questions for the patient today.  We have obtained new x-rays today for preop planning. Dictated by:   Irena Cords, P.A.-C. Attending:  R. Valma Cava, M.D. DD:  12/12/00 TD:  12/12/00 Job: [id number]

## 2010-08-25 NOTE — H&P (Signed)
NAME:  Laura [name], Laura [name]                    ACCOUNT NO.:  [id number]   MEDICAL RECORD NO.:  [id number]                   PATIENT TYPE:  INP   LOCATION:  NA                                   FACILITY:  [facility]   PHYSICIAN:  Erasmo Leventhal, M.D.         DATE OF BIRTH:  1929/09/29   DATE OF ADMISSION:  DATE OF DISCHARGE:                                HISTORY & PHYSICAL   ANTICIPATED DATE OF ADMISSION:  Preop History and Physical for surgery September 10, 2003.   CHIEF COMPLAINT:  Right knee osteoarthritis.   HISTORY OF PRESENT ILLNESS:  This is a 77 year old lady with a history of  osteoarthritis of the bilateral knees with previous total knee arthroplasty  of her left knee.  She has done very well from that.  Continues to have  significant pain and discomfort in her right knee with a severe valgus  deformity that has not responded to conservative management.  After  discussion of treatment options, risks and benefits of the surgery, she is  now scheduled for total knee replacement on the right knee.  She will have  medical clearance by Dr. Donnie Aho, her cardiologist, and Dr. Waynard Edwards, her  medical doctor prior to surgery.  She is on Plavix and aspirin and will stop  this 7 days prior to the surgery.  Questions are invited and answered.   PAST MEDICAL HISTORY:  Drug ALLERGY to CODEINE which causes nausea.   CURRENT MEDICATIONS:  1. Plavix 75 mg one daily.  2. Crestor 20 mg one daily.  3. Metoprolol 100 mg b.i.d.  4. Diovan 160 mg one daily.  5. Aspirin 81 mg one daily.   PREVIOUS SURGERIES:  1. Total knee arthroplasty, left knee.  2. Appendectomy.  3. Cardiac stent placement.   SERIOUS MEDICAL ILLNESSES:  1. Coronary artery disease.  2. MI.  3. Hypertension.  4. Bronchitis.  5. Asthma.   FAMILY HISTORY:  Negative.   SOCIAL HISTORY:  The patient is married.  She is retired.  She does not  smoke and drinks occasionally.  She lives with her husband.   REVIEW OF SYSTEMS:   CENTRAL NERVOUS SYSTEM:  Negative for a headache,  blurred vision, or dizziness.  PULMONARY:  Positive for history of asthma  and chronic bronchitis.  CARDIOVASCULAR:  Positive for a history of coronary  artery disease with MI.  Negative for angina.  She has had 2 stents placed.  GI:  Negative for ulcers or hepatitis.  GU:  Negative for urinary tract  difficulty.  MUSCULOSKELETAL:  Positive in the HPI.   PHYSICAL EXAMINATION:  GENERAL:  A well-developed, well-nourished obese lady  in no acute distress.  VITAL SIGNS:  BP 110/70, respirations 16, pulse 80 and regular.  HEENT:  Head normocephalic.  Nose patent.  Ears patent.  Pupils equal,  round, reactive to light.  Throat without injection.  NECK:  Supple without adenopathy.  Carotids 2+ without bruit.  CHEST:  Clear  to auscultation.  No rales or rhonchi.  Respirations 16.  HEART:  Regular rate and rhythm at 72 beats per minute without murmur.  ABDOMEN:  Obese and soft.  There are no masses or organomegaly.  Active  bowel sounds are noted.  NEUROLOGICAL:  The patient alert and oriented to time, place, and person.  Cranial nerves II-XII grossly intact.  EXTREMITIES:  Shows the right knee with about a 15 degree valgus deformity.  She has 0 to 120 degree range of motion.  Dorsalis pedis, posterior tibialis  pulses are 2+.   X-rays show end-stage osteoarthritis with a valgus knee on the right knee.   IMPRESSION:  End-stage osteoarthritis right knee.   PLAN:  Total knee arthroplasty of the right knee done under general  anesthesia so that we may evaluate her peroneal nerve postop.     Jaquelyn Bitter. Chabon, P.A.                   Erasmo Leventhal, M.D.    SJC/MEDQ  D:  08/30/2003  T:  08/30/2003  Job:  [id number]

## 2010-08-25 NOTE — Op Note (Signed)
[facility]  Patient:    Laura [name], Laura [name] Visit Number: [id number] MRN: [id number]          Service Type: [facility] Location: [id number] Attending Physician:  Faith Rogue T Dictated by:   R. Valma Cava, M.D. Proc. Date: 12/31/00 Admit Date:  01/08/2001 Discharge Date: 01/12/2001                             Operative Report  PREOPERATIVE DIAGNOSES:  Left knee end-stage osteoarthritis with associated osteonecrosis.  POSTOPERATIVE DIAGNOSES:  Left knee end-stage osteoarthritis with associated osteonecrosis.  OPERATION/PROCEDURE:  Left total knee arthroplasty.  SURGEON:  R. Valma Cava, M.D.  ASSISTANT:  Dorie Rank, P.A.C.  ANESTHESIA:  General, Dr. Shireen Quan.  ESTIMATED BLOOD LOSS:  Less than 50 cc.  DRAINS:  Tube and a Hemovac.  COMPLICATIONS:  None.  TOURNIQUET TIME:  1 hour and 50 minutes at 375 mmHg.  DISPOSITION:  To PACU, stable.  OPERATIVE IMPLANTS:  Osteonics components, all cemented.  Posterior stabilized.  Size 7 femur, size 7 tibia, 26 mm patella, 10 mm polyethylene insert.  OPERATIVE DETAILS:  The patient was evaluated in the holding area.  The chart was reviewed and signed appropriately.  IV had been started and antibiotics were given.  She was taken to the OR, placed in the supine position and general anesthesia.  A Foley catheter was placed utilizing sterile technique by the OR circulating nurse.  The left knee was examined.  She had a 10-degree flexion contracture.  She can flex to 110 degrees.  The leg was prepped and draped in a sterile fashion. Exsanguinated with Esmarch and tourniquet was inflated to 375 mmHg.  A midline incision made through the skin and subcutaneous tissue.  Small veins were electrocoagulated.  Medial and lateral soft tissue flaps were developed at appropriate level.  Medial parapatellar arthrotomy was performed and the medial soft tissue released at the proximal tibia.  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  The cruciate ligaments were resected.  A starting hole was made in the distal femur.  Canal was irrigated, intramedullary rod was gently placed.  We chose a 12 mm valgus cut off the distal femur base due to her flexion contracture. Distal femur was massaged, a size #7, rotation marks were made and distal femur was cut to fit a size #7.  Tibial ends were resected and osteophytes were removed from the proximal tibial.  I felt a border between her natural tibia and the medial osteophytes and made that clear with the rongeur.  The proximal tibia was found to be a size #7.  A starting hole was made, temporary aligned, and the canal was again irrigated.  The intramedullary rod was gently placed to make sure the canal was well decompressed.  A 5-degree posterior slide was chosen, alignment was sent and a 10-mm cut based on the lateral side was cut initially.  This gave a nice cut with good bone on both the medial and lateral sides.  Preoperatively I had available components to do a stemmed tibia and with a wedge on the medial side, if necessary, and it was not necessary.  Posteromedial and  posterolateral femoral osteophytes were under direct visualization, and the femoral trochlea was repaired in the standard fashion.  At this point in time, the trials of the #7 femur, #7 tibia, 10-mm insert we had excellent range of motion,  alignment, and soft tissue balance.  Rotation was made in the proximal tibia and the delta keel was performed in a standard fashion.  Tibia was found to be a size 26.  It was 24 mm in thickness.  Osteophytes were removed and it was reamed for a depth of 10 mm and locking holes were made. At this point in time, utilizing Modern cement technique all components were cemented into place:  Size #7 tibia, size #7 femur with a 26-mm patella. With the 10 mm trial we had excellent range of motion and  soft tissue balance. Patella tracking was lateral.  Lateral edges were formed giving anatomic telephone tracking.  The tibial trial was removed.  Excess cement was removed and then a final component of 10-mm tibial instrument was placed.  During this closure we irrigated antibiotic solution during each layer of the closure.  Two Hemovac drains were placed.  A sequential closure of layers were done.  Synovium Vicryl, arthrotomy Vicryl, subcu Vicryl, skin closed with subcuticular Monopril suture.  Benzoin and Steri-Strips were applied. 20% of 1/2% Marcaine to the joint after surgery.  Sterile compression dressing was applied.  Tourniquet was deflated with normal pulses and circulation of the foot and ankle were in the case.  She was given 1 g of Ancef intravenously Ice pack and knee immobilizer was applied in full extension.  Sponge and needle count were correct.  There were no complications.  She was gently awakened and taken from the operating room to the PACU in stable condition. Dictated by:   R. Valma Cava, M.D. Attending Physician:  Faith Rogue T DD:  12/31/00 TD:  12/31/00 Job: [id number]

## 2010-08-25 NOTE — Discharge Summary (Signed)
[place]  Patient:    Laura [name], POL Visit Number: [id number] MRN: [id number]          Service Type: [facility] Location: [phone] Attending Physician:  Faith Rogue T Dictated by:   Nile Dear, PA Admit Date:  01/08/2001 Discharge Date: 01/12/2001   CC:         Rodrigo Ran, M.D.  Benny Lennert, M.D.   Discharge Summary  DISCHARGE DIAGNOSES:  1. Status post left total knee replacement.  2. Postoperative anemia.  3. Ankle tendonitis.  HISTORY OF PRESENT ILLNESS: [name] is a 77 year old female, with a history of dyslipidemia, OA left knee, with failure of conservative therapy.  She elected to undergo left total knee replacement on January 03, 2001 by Dr. Hayden Rasmussen.  Postoperatively she is weightbearing as tolerated and on Coumadin for DVT prophylaxis.  INR currently is 2.8 today.  She has had problems with nausea and vomiting secondary to ileus as well as confusion post surgery secondary to narcotics.  Also noted to have bilateral ankle pain probably secondary to tendonitis and Bextra was initiated on December 27, 2000.  Currently notes were unavailable at the time of admission; however, by last progress reports the patient had been modest assistance with transfers, minimal assistance with short distances.  PAST MEDICAL HISTORY:  1. Dyslipidemia.  2. Obesity.  3. Appendectomy.  4. Left ankle ORIF.  5. Increased blood pressure.  ALLERGIES: CODEINE.  SOCIAL HISTORY: The patient is married and lives in a two-level home with two steps at entry.  She was independent prior to admission.  Does not use any tobacco or alcohol.  HOSPITAL COURSE: [name] was admitted to rehab on January 08, 2001 for inpatient therapy to consist of PT/OT daily.  Past admission the patient was maintained on Coumadin for DVD prophylaxis.  Her nausea and vomiting had resolved and she was eating solids with a  regular diet without difficulty. She did complain of right ankle pain greater than left ankle and Bextra was used p.r.n. with improvement in her symptomatology.  Admission laboratories showed a hemoglobin of 9.3, hematocrit 27.7, WBC 11.0; platelets 540,000. Sodium 139, potassium 3.5, chloride 99, CO2 21, BUN 11, creatinine 0.8, glucose 108.  She was noted to have positive UA; however, was noted to have no symptomatology.  Urine culture done showed multiple species.  The patient has had no complaints of bowel or bladder during this stay.  Blood pressures have shown reasonable control.  At the time of discharge the patient was modified independent for self-care needs.  She was modified independent for transfers, modified independent for ambulating 150 feet with a rolling walker.  Knee flexion was at 74 degrees.  The patient is to continue with follow-up with PT/OT past discharge.  Home health R.N. was arranged to draw pro time.  The patient is to continue on Coumadin through January 30, 2001.  On January 12, 2001 the patient was discharged home.  DISCHARGE MEDICATIONS:  1. Tenormin 50 mg q.d.  2. Pepcid 20 mg b.i.d.  3. Trinsicon one p.o. b.i.d.  4. Senokot-S two q.h.s.  5. Coumadin 4 mg q.d.  6. Darvocet-N 100 one to two q.4h to q.6h p.r.n. pain.  7. Bextra 20 mg q.d.  DISCHARGE ACTIVITY: Use walker.  WOUND CARE: Keep the area clean and dry, ice to left knee and ankle t.i.d. at 30 minute intervals.  SPECIAL INSTRUCTIONS: She is to continue Coumadin through January 30, 2001 and home  health R.N. to draw pro time check on Monday.  FOLLOW-UP: The patient is to follow up with Dr. Hayden Rasmussen in two to three weeks and follow up with Dr. Riley Kill as needed. Dictated by:   Nile Dear, PA Attending Physician:  Faith Rogue T DD:  02/12/01 TD:  02/14/01 Job: [id number]

## 2010-08-25 NOTE — Discharge Summary (Signed)
NAME:  Laura [name], Laura [name]                    ACCOUNT NO.:  [id number]   MEDICAL RECORD NO.:  [id number]                   PATIENT TYPE:  INP   LOCATION:  2001                                 FACILITY:  [facility]   PHYSICIAN:  Darden Palmer., M.D.         DATE OF BIRTH:  March 12, 1930   DATE OF ADMISSION:  12/04/2001  DATE OF DISCHARGE:  12/09/2001                                 DISCHARGE SUMMARY   FINAL DIAGNOSES:  1. Acute inferior myocardial infarction, initial episode with early     ventricular fibrillation.  2. Hypertension.  3. Dyslipidemia.  4. Obesity.  5. Osteoarthritis.   PROCEDURE:  Cardiac catheterization.   CONSULTATIONS:  Shawnie Dapper, M.D.   HISTORY OF PRESENT ILLNESS:  This 77 year old female has a history of  hypertension and presented with acute chest pain onset at around 10:30 a.m.  She was found to have an acute inferior infarction and was transferred to  [facility] emergency room.  After arrival in the emergency room,  she had a ventricular fibrillatory arrest and was cardioverted.  Then she  was taken to the cardiac catheterization laboratory for intervention.  Please see the previously dictated history and physical for remainder of the  details.   HOSPITAL COURSE:  The patient was admitted directly to the cardiac  catheterization lab.  Acute cardiac catheterization showed an EF of 50% with  inferior wall hypokinesis.  Left main coronary artery was normal, there was  minimal disease in the circumflex and LAD. The right coronary artery was  occluded proximally with collaterals in the left coronary artery. A stent  3.5 x 23 mm stent was deployed at 16 atmospheres in the mid right coronary  artery.  This was a Zeta stent.  Intravascular ultrasound done showed some  thrombus distal to the stent and a 5.0 x 20 mm Express II stent was then  placed around this thrombus with no residual filling defect.  There was good  stent apposition  afterward.  She was transferred back to the coronary unit  and was treated with Integrilin.  She developed a tongue and neck hematoma  where she had bit her tongue following the seizure that she had when she  arrested.  She had a glucose of 186 the next day and a potassium of 3.3.  Chemistry panel showed hemoglobin of 16 and fell to 11.1.  PT and PTT were  normal on admission.  Potassium was 3.3 initially and rose to 4.1.  Two  glucose were 186 and 146, BUN was 28 and fell to 14.  CPK rose to 3028 with  426 MB.  EKG revealed an acute inferior infarction. She was moved to the  floor, had no recurrence of chest pain, and was ambulated.  She was begun on  beta blockers and ACE inhibitors.  She was ready for discharge and was  discharged on December 09, 2001, on aspirin 81 mg daily, Plavix 75 mg daily,  Altace 10 mg daily, Lopressor 50 mg b.i.d., nitroglycerin 1/150 sublingual  as needed, Lipitor 10 mg daily, and Zoloft 25 mg daily. She is to follow up  in one week and is to call if there are problems.                                               Darden Palmer., M.D.   WST/MEDQ  D:  01/27/2002  T:  01/28/2002  Job:  [id number]   cc:   Loraine Leriche A. Perini, M.D.

## 2011-01-09 LAB — BASIC METABOLIC PANEL
Calcium: 8.8
Glucose, Bld: 117 — ABNORMAL HIGH

## 2011-01-09 LAB — CBC
Hemoglobin: 13.2
MCV: 90.7
RBC: 4.43
WBC: 7.1

## 2011-05-19 ENCOUNTER — Encounter (HOSPITAL_BASED_OUTPATIENT_CLINIC_OR_DEPARTMENT_OTHER): Payer: Self-pay | Admitting: *Deleted

## 2011-05-19 ENCOUNTER — Inpatient Hospital Stay (HOSPITAL_BASED_OUTPATIENT_CLINIC_OR_DEPARTMENT_OTHER)
Admission: EM | Admit: 2011-05-19 | Discharge: 2011-05-24 | DRG: 291 | Disposition: A | Discharge status: Home Health | Payer: Medicare Other | Attending: Internal Medicine | Admitting: Internal Medicine

## 2011-05-19 ENCOUNTER — Emergency Department (HOSPITAL_BASED_OUTPATIENT_CLINIC_OR_DEPARTMENT_OTHER): Payer: Medicare Other

## 2011-05-19 ENCOUNTER — Emergency Department (INDEPENDENT_AMBULATORY_CARE_PROVIDER_SITE_OTHER): Payer: Medicare Other

## 2011-05-19 ENCOUNTER — Other Ambulatory Visit: Payer: Self-pay

## 2011-05-19 DIAGNOSIS — E785 Hyperlipidemia, unspecified: Secondary | ICD-10-CM

## 2011-05-19 DIAGNOSIS — J441 Chronic obstructive pulmonary disease with (acute) exacerbation: Secondary | ICD-10-CM

## 2011-05-19 DIAGNOSIS — I5031 Acute diastolic (congestive) heart failure: Principal | ICD-10-CM | POA: Diagnosis present

## 2011-05-19 DIAGNOSIS — R5381 Other malaise: Secondary | ICD-10-CM

## 2011-05-19 DIAGNOSIS — G4733 Obstructive sleep apnea (adult) (pediatric): Secondary | ICD-10-CM | POA: Diagnosis present

## 2011-05-19 DIAGNOSIS — R0902 Hypoxemia: Secondary | ICD-10-CM

## 2011-05-19 DIAGNOSIS — M810 Age-related osteoporosis without current pathological fracture: Secondary | ICD-10-CM | POA: Diagnosis present

## 2011-05-19 DIAGNOSIS — J449 Chronic obstructive pulmonary disease, unspecified: Secondary | ICD-10-CM | POA: Diagnosis present

## 2011-05-19 DIAGNOSIS — I509 Heart failure, unspecified: Secondary | ICD-10-CM

## 2011-05-19 DIAGNOSIS — I119 Hypertensive heart disease without heart failure: Secondary | ICD-10-CM | POA: Diagnosis present

## 2011-05-19 DIAGNOSIS — I2510 Atherosclerotic heart disease of native coronary artery without angina pectoris: Secondary | ICD-10-CM | POA: Diagnosis present

## 2011-05-19 DIAGNOSIS — N390 Urinary tract infection, site not specified: Secondary | ICD-10-CM | POA: Diagnosis present

## 2011-05-19 DIAGNOSIS — I252 Old myocardial infarction: Secondary | ICD-10-CM

## 2011-05-19 DIAGNOSIS — R0602 Shortness of breath: Secondary | ICD-10-CM

## 2011-05-19 DIAGNOSIS — J4489 Other specified chronic obstructive pulmonary disease: Secondary | ICD-10-CM | POA: Diagnosis present

## 2011-05-19 DIAGNOSIS — R0689 Other abnormalities of breathing: Secondary | ICD-10-CM | POA: Diagnosis present

## 2011-05-19 DIAGNOSIS — J9600 Acute respiratory failure, unspecified whether with hypoxia or hypercapnia: Secondary | ICD-10-CM | POA: Diagnosis present

## 2011-05-19 DIAGNOSIS — I10 Essential (primary) hypertension: Secondary | ICD-10-CM | POA: Diagnosis present

## 2011-05-19 DIAGNOSIS — Z9861 Coronary angioplasty status: Secondary | ICD-10-CM

## 2011-05-19 HISTORY — DX: Acute myocardial infarction, unspecified: I21.9

## 2011-05-19 HISTORY — DX: Essential (primary) hypertension: I10

## 2011-05-19 HISTORY — DX: Atherosclerotic heart disease of native coronary artery without angina pectoris: I25.10

## 2011-05-19 LAB — BASIC METABOLIC PANEL
BUN: 26 mg/dL — ABNORMAL HIGH (ref 6–23)
CO2: 28 mEq/L (ref 19–32)
Calcium: 9.4 mg/dL (ref 8.4–10.5)
Chloride: 95 mEq/L — ABNORMAL LOW (ref 96–112)
GFR calc Af Amer: >90 mL/min (ref >90)
GFR calc non Af Amer: 80 mL/min — ABNORMAL LOW (ref >90)

## 2011-05-19 LAB — CBC
MCHC: 32.3 g/dL (ref 30.0–36.0)
MCV: 82.1 fL (ref 78.0–100.0)
Platelets: 160 10*3/uL (ref 150–400)
RBC: 5.43 MIL/uL — ABNORMAL HIGH (ref 3.87–5.11)
WBC: 9 10*3/uL (ref 4.0–10.5)

## 2011-05-19 LAB — PRO B NATRIURETIC PEPTIDE: Pro B Natriuretic peptide (BNP): 13423 pg/mL — ABNORMAL HIGH (ref 0–450)

## 2011-05-19 LAB — CARDIAC PANEL(CRET KIN+CKTOT+MB+TROPI)
CK, MB: 8.8 ng/mL (ref 0.3–4.0)
Relative Index: INVALID (ref 0.0–2.5)
Total CK: 97 U/L (ref 7–177)

## 2011-05-19 MED ORDER — NITROGLYCERIN 0.4 MG SL SUBL
0.4000 mg | SUBLINGUAL_TABLET | Freq: Once | SUBLINGUAL | Status: AC
  Administered 2011-05-19: 0.4 mg via SUBLINGUAL
  Filled 2011-05-19: qty 25

## 2011-05-19 MED ORDER — ALBUTEROL SULFATE (5 MG/ML) 0.5% IN NEBU
5.0000 mg | INHALATION_SOLUTION | Freq: Once | RESPIRATORY_TRACT | Status: AC
  Administered 2011-05-19: 5 mg via RESPIRATORY_TRACT

## 2011-05-19 MED ORDER — POTASSIUM CHLORIDE CRYS ER 20 MEQ PO TBCR
20.0000 meq | EXTENDED_RELEASE_TABLET | Freq: Two times a day (BID) | ORAL | Status: AC
  Administered 2011-05-20 (×2): 20 meq via ORAL
  Filled 2011-05-19 (×2): qty 1

## 2011-05-19 MED ORDER — ACETAMINOPHEN 325 MG PO TABS
650.0000 mg | ORAL_TABLET | ORAL | Status: DC | PRN
  Administered 2011-05-22 – 2011-05-23 (×3): 650 mg via ORAL
  Filled 2011-05-19 (×4): qty 2

## 2011-05-19 MED ORDER — ALBUTEROL SULFATE (5 MG/ML) 0.5% IN NEBU
INHALATION_SOLUTION | RESPIRATORY_TRACT | Status: AC
  Administered 2011-05-19: 5 mg via RESPIRATORY_TRACT
  Filled 2011-05-19: qty 1

## 2011-05-19 MED ORDER — SODIUM CHLORIDE 0.9 % IV SOLN
Freq: Once | INTRAVENOUS | Status: AC
  Administered 2011-05-19: 19:00:00 via INTRAVENOUS

## 2011-05-19 MED ORDER — HYDRALAZINE HCL 20 MG/ML IJ SOLN
10.0000 mg | Freq: Once | INTRAMUSCULAR | Status: AC
  Administered 2011-05-19: 10 mg via INTRAVENOUS
  Filled 2011-05-19: qty 1

## 2011-05-19 MED ORDER — ENOXAPARIN SODIUM 40 MG/0.4ML ~~LOC~~ SOLN
40.0000 mg | SUBCUTANEOUS | Status: DC
  Administered 2011-05-20 – 2011-05-24 (×5): 40 mg via SUBCUTANEOUS
  Filled 2011-05-19 (×6): qty 0.4

## 2011-05-19 MED ORDER — SODIUM CHLORIDE 0.9 % IV SOLN
250.0000 mL | INTRAVENOUS | Status: DC | PRN
  Administered 2011-05-20: 250 mL via INTRAVENOUS

## 2011-05-19 MED ORDER — ONDANSETRON HCL 4 MG/2ML IJ SOLN: 4.0000 mg | Freq: Four times a day (QID) | INTRAMUSCULAR | Status: DC | PRN

## 2011-05-19 MED ORDER — SODIUM CHLORIDE 0.9 % IJ SOLN
3.0000 mL | INTRAMUSCULAR | Status: DC | PRN
  Administered 2011-05-20: 3 mL via INTRAVENOUS

## 2011-05-19 MED ORDER — SODIUM CHLORIDE 0.9 % IJ SOLN
3.0000 mL | Freq: Two times a day (BID) | INTRAMUSCULAR | Status: DC
  Administered 2011-05-20 – 2011-05-23 (×7): 3 mL via INTRAVENOUS

## 2011-05-19 MED ORDER — IPRATROPIUM BROMIDE 0.02 % IN SOLN
0.5000 mg | Freq: Once | RESPIRATORY_TRACT | Status: AC
  Administered 2011-05-19: 0.5 mg via RESPIRATORY_TRACT

## 2011-05-19 MED ORDER — METHYLPREDNISOLONE SODIUM SUCC 125 MG IJ SOLR
125.0000 mg | Freq: Once | INTRAMUSCULAR | Status: AC
  Administered 2011-05-19: 125 mg via INTRAVENOUS
  Filled 2011-05-19: qty 2

## 2011-05-19 MED ORDER — IPRATROPIUM BROMIDE 0.02 % IN SOLN
RESPIRATORY_TRACT | Status: AC
  Administered 2011-05-19: 0.5 mg via RESPIRATORY_TRACT
  Filled 2011-05-19: qty 2.5

## 2011-05-19 MED ORDER — FUROSEMIDE 10 MG/ML IJ SOLN
60.0000 mg | Freq: Once | INTRAMUSCULAR | Status: AC
  Administered 2011-05-19: 60 mg via INTRAVENOUS
  Filled 2011-05-19: qty 6

## 2011-05-19 MED ORDER — FUROSEMIDE 10 MG/ML IJ SOLN
40.0000 mg | Freq: Two times a day (BID) | INTRAMUSCULAR | Status: DC
  Administered 2011-05-20 (×3): 40 mg via INTRAVENOUS
  Filled 2011-05-19 (×6): qty 4

## 2011-05-19 NOTE — ED Notes (Signed)
Xray at bedside for portable chest 

## 2011-05-19 NOTE — ED Notes (Signed)
Pt reports sob since Tuesday- saw pcp on Tuesday was given breathing tx in the office and albuterol inhaler- pt reports she has used up the inhaler and came to ED per family

## 2011-05-19 NOTE — ED Provider Notes (Signed)
History     CSN: [id number]  Arrival date & time 05/19/11  1716   First MD Initiated Contact with Patient 05/19/11 1726      Chief Complaint  Patient presents with  . Shortness of Breath   HPI History provided by the patient and her family.  Laura [name] says she has been having shortness of breat since Tuesday.  She was seen at her PCP's office that day and given a breathing treatment with albuterol and sent home with prednisone and albuterol and hydrocodone.  She has not improved.  Over the past few days, her shortness of breat has gotten worse.  She says she also has a cough, but has not been able to cough anything up.  She denies fevers, chills, chest pain.    Past Medical History  Diagnosis Date  . Coronary artery disease   . Myocardial infarction   . Hypertension   . Arthritis   . Asthma   . Chronic bronchitis     Past Surgical History  Procedure Date  . Coronary stent placement   . Appendectomy   . Tonsillectomy   . Knee surgery     Family History: Non-contributory  History  Substance Use Topics  . Smoking status: Never Smoker   . Smokeless tobacco: Never Used  . Alcohol Use: No    Review of Systems  Constitutional: Negative for fever.  HENT: Negative for rhinorrhea.   Eyes: Negative for visual disturbance.  Respiratory: Positive for cough, shortness of breath and wheezing.   Cardiovascular: Negative for chest pain and palpitations.  Gastrointestinal: Negative for nausea and abdominal pain.  Genitourinary: Negative for dysuria.  Musculoskeletal: Positive for arthralgias.  Skin: Negative for rash.  Neurological: Negative for dizziness.    Allergies  Codeine  Home Medications  No current outpatient prescriptions on file.  BP 205/91  Pulse 64  Temp(Src) 97.8 F (36.6 C) (Oral)  Resp 30  SpO2 98%  Physical Exam  Constitutional: She is oriented to person, place, and time. She appears well-developed and well-nourished. She appears distressed.  HENT:   Head: Normocephalic and atraumatic.  Eyes: EOM are normal. Pupils are equal, round, and reactive to light.  Neck: Normal range of motion. Neck supple. No JVD present.  Cardiovascular: Normal rate, regular rhythm and normal heart sounds.   Pulmonary/Chest: Tachypnea noted. She has wheezes. She has no rhonchi.  Abdominal: Soft. Bowel sounds are normal. There is no tenderness.  Musculoskeletal:       Patient has hyperpigmentation and scaling of lower extremities.  There is trace pitting edema.   Neurological: She is alert and oriented to person, place, and time.    ED Course  Procedures (including critical care time)  Labs Reviewed  CBC - Abnormal; Notable for the following:    RBC 5.43 (*)    All other components within normal limits  PRO B NATRIURETIC PEPTIDE - Abnormal; Notable for the following:    Pro B Natriuretic peptide (BNP) 13423.0 (*)    All other components within normal limits  BASIC METABOLIC PANEL - Abnormal; Notable for the following:    Sodium 134 (*)    Chloride 95 (*)    Glucose, Bld 136 (*)    BUN 26 (*)    GFR calc non Af Amer 80 (*)    All other components within normal limits  CARDIAC PANEL(CRET KIN+CKTOT+MB+TROPI) - Abnormal; Notable for the following:    CK, MB 8.8 (*)    All other components within normal  limits   No results found.   Hypoxia secondary to bronchitis vs. CHF   MDM  Pt with wheezing, some improvement with albuterol and solumedrol Pt is hypertensive, some improvement with Hydralazine.  Will give Nitro and Lasix in setting of elevated BNP.  Will admit to [facility]         Ardyth Gal, MD 05/19/11 2149

## 2011-05-19 NOTE — ED Notes (Signed)
ckmb 8.8 reported to EDP [facility]

## 2011-05-19 NOTE — ED Notes (Signed)
Charge RN made aware of patients condition. RT at bedside to assess pt

## 2011-05-20 ENCOUNTER — Inpatient Hospital Stay (HOSPITAL_COMMUNITY): Payer: Medicare Other

## 2011-05-20 ENCOUNTER — Other Ambulatory Visit: Payer: Self-pay

## 2011-05-20 DIAGNOSIS — G4733 Obstructive sleep apnea (adult) (pediatric): Secondary | ICD-10-CM

## 2011-05-20 DIAGNOSIS — R0689 Other abnormalities of breathing: Secondary | ICD-10-CM | POA: Diagnosis present

## 2011-05-20 DIAGNOSIS — I119 Hypertensive heart disease without heart failure: Secondary | ICD-10-CM | POA: Diagnosis present

## 2011-05-20 DIAGNOSIS — R0602 Shortness of breath: Secondary | ICD-10-CM | POA: Diagnosis present

## 2011-05-20 DIAGNOSIS — I2510 Atherosclerotic heart disease of native coronary artery without angina pectoris: Secondary | ICD-10-CM

## 2011-05-20 DIAGNOSIS — R0902 Hypoxemia: Secondary | ICD-10-CM | POA: Diagnosis present

## 2011-05-20 DIAGNOSIS — E785 Hyperlipidemia, unspecified: Secondary | ICD-10-CM | POA: Clinically undetermined

## 2011-05-20 LAB — CARDIAC PANEL(CRET KIN+CKTOT+MB+TROPI)
CK, MB: 8.2 ng/mL (ref 0.3–4.0)
CK, MB: 5.1 ng/mL — ABNORMAL HIGH (ref 0.3–4.0)
Relative Index: INVALID (ref 0.0–2.5)
Total CK: 80 U/L (ref 7–177)
Total CK: 57 U/L (ref 7–177)
Troponin I: <0.3 ng/mL (ref <0.30)
Troponin I: <0.3 ng/mL (ref <0.30)

## 2011-05-20 LAB — BLOOD GAS, ARTERIAL
Acid-Base Excess: 6.8 mmol/L — ABNORMAL HIGH (ref 0.0–2.0)
Acid-Base Excess: 7.2 mmol/L — ABNORMAL HIGH (ref 0.0–2.0)
Delivery systems: POSITIVE
Drawn by: 28340
O2 Content: 3 L/min
Patient temperature: 98.6
TCO2: 33.8 mmol/L (ref 0–100)
TCO2: 35.1 mmol/L (ref 0–100)
pCO2 arterial: 57.4 mmHg (ref 35.0–45.0)
pCO2 arterial: 65.9 mmHg (ref 35.0–45.0)
pH, Arterial: 7.322 — ABNORMAL LOW (ref 7.350–7.400)

## 2011-05-20 LAB — BASIC METABOLIC PANEL
CO2: 30 mEq/L (ref 19–32)
Chloride: 93 mEq/L — ABNORMAL LOW (ref 96–112)
Sodium: 138 mEq/L (ref 135–145)

## 2011-05-20 LAB — CBC
HCT: 47.5 % — ABNORMAL HIGH (ref 36.0–46.0)
MCH: 26.8 pg (ref 26.0–34.0)
MCHC: 31.8 g/dL (ref 30.0–36.0)
MCV: 84.2 fL (ref 78.0–100.0)
RDW: 14.9 % (ref 11.5–15.5)
WBC: 9.2 10*3/uL (ref 4.0–10.5)

## 2011-05-20 LAB — CREATININE, SERUM: GFR calc Af Amer: >90 mL/min (ref >90)

## 2011-05-20 LAB — MRSA PCR SCREENING: MRSA by PCR: NEGATIVE

## 2011-05-20 MED ORDER — VITAMIN D (ERGOCALCIFEROL) 1.25 MG (50000 UNIT) PO CAPS
50000.0000 [IU] | ORAL_CAPSULE | ORAL | Status: DC
  Administered 2011-05-21: 50000 [IU] via ORAL
  Filled 2011-05-20: qty 1

## 2011-05-20 MED ORDER — NITROGLYCERIN IN D5W 200-5 MCG/ML-% IV SOLN
5.0000 ug/min | INTRAVENOUS | Status: DC
  Administered 2011-05-20: 5 ug/min via INTRAVENOUS
  Filled 2011-05-20 (×2): qty 250

## 2011-05-20 MED ORDER — RALOXIFENE HCL 60 MG PO TABS
60.0000 mg | ORAL_TABLET | Freq: Every day | ORAL | Status: DC
  Administered 2011-05-20 – 2011-05-24 (×5): 60 mg via ORAL
  Filled 2011-05-20 (×5): qty 1

## 2011-05-20 MED ORDER — CEFUROXIME AXETIL 500 MG PO TABS
500.0000 mg | ORAL_TABLET | Freq: Two times a day (BID) | ORAL | Status: DC
  Administered 2011-05-20 (×2): 500 mg via ORAL
  Filled 2011-05-20 (×5): qty 1

## 2011-05-20 MED ORDER — GUAIFENESIN-DM 100-10 MG/5ML PO SYRP
15.0000 mL | ORAL_SOLUTION | ORAL | Status: DC | PRN
  Administered 2011-05-20 – 2011-05-23 (×7): 15 mL via ORAL
  Filled 2011-05-20: qty 15
  Filled 2011-05-20: qty 5
  Filled 2011-05-20 (×6): qty 15

## 2011-05-20 MED ORDER — PREDNISONE 10 MG PO TABS
10.0000 mg | ORAL_TABLET | Freq: Two times a day (BID) | ORAL | Status: DC
  Administered 2011-05-20 (×2): 10 mg via ORAL
  Filled 2011-05-20 (×5): qty 1

## 2011-05-20 MED ORDER — EZETIMIBE 10 MG PO TABS
10.0000 mg | ORAL_TABLET | Freq: Every day | ORAL | Status: DC
  Administered 2011-05-20 – 2011-05-24 (×5): 10 mg via ORAL
  Filled 2011-05-20 (×5): qty 1

## 2011-05-20 MED ORDER — IPRATROPIUM BROMIDE 0.02 % IN SOLN
0.5000 mg | RESPIRATORY_TRACT | Status: DC
  Administered 2011-05-20 – 2011-05-21 (×7): 0.5 mg via RESPIRATORY_TRACT
  Filled 2011-05-20 (×8): qty 2.5

## 2011-05-20 MED ORDER — ASPIRIN EC 81 MG PO TBEC
162.0000 mg | DELAYED_RELEASE_TABLET | Freq: Every day | ORAL | Status: DC
  Administered 2011-05-20 – 2011-05-24 (×5): 162 mg via ORAL
  Filled 2011-05-20 (×5): qty 2

## 2011-05-20 MED ORDER — METOPROLOL TARTRATE 25 MG PO TABS
25.0000 mg | ORAL_TABLET | Freq: Two times a day (BID) | ORAL | Status: DC
  Administered 2011-05-20 – 2011-05-24 (×10): 25 mg via ORAL
  Filled 2011-05-20 (×10): qty 1

## 2011-05-20 MED ORDER — ALBUTEROL SULFATE (5 MG/ML) 0.5% IN NEBU
2.5000 mg | INHALATION_SOLUTION | RESPIRATORY_TRACT | Status: DC
  Administered 2011-05-20 – 2011-05-21 (×7): 2.5 mg via RESPIRATORY_TRACT
  Filled 2011-05-20 (×8): qty 0.5

## 2011-05-20 MED ORDER — NITROGLYCERIN 0.2 MG/HR TD PT24
0.2000 mg | MEDICATED_PATCH | Freq: Every day | TRANSDERMAL | Status: DC
  Administered 2011-05-20 – 2011-05-24 (×6): 0.2 mg via TRANSDERMAL
  Filled 2011-05-20 (×6): qty 1

## 2011-05-20 NOTE — H&P (Addendum)
DATE OF ADMISSION:  05/20/2011  PCP:  Marveen Reeks    Chief Complaint:  Worsening SOB  HPI: Laura [name] is an 78 y.o. female who was taken to the [facility] ED due to worsening SOB and increased lethargy.  Patient has had worsening SOB over the past week along with Dyspnea on exertion.  She was placed on treatments by her PCP of Omnicef anitibiotic therapy and a prednisone taper along with inhalers for asthmatic bronchitis but her symptoms continued to worsen.  She reports not being able to lay flat or sleep because she can not breathe.  She denies having Chest pain.  She does report having a cough, abut she can not cough out any sputum.  She denies having fevers or chills.  She reports having orthopnea for months.  She also reports in the past being sent for a sleep study to evaluate for sleep apnea, but she was unable to sleep so the study was not able to be performed.    In the ED her laboratory studies revealed a Pro-BNP of 13,423, her chest X-rays, however  was inconclusive due to her positioning.  She reports having CAD and had an MI with stent placement 10 years ago, but she denies having a diagnosis of CHF and also denies having Sleep apnea.  Her husband and daughter are at bedside an report that she is hypersomnulent, and falls asleep often in the daytime, and snores.    Past Medical History  Diagnosis Date  . Coronary artery disease   . Myocardial infarction   . Hypertension   . Arthritis   . Asthma   . Chronic bronchitis     Past Surgical History  Procedure Date  . Coronary stent placement   . Appendectomy   . Tonsillectomy   . Knee surgery     Medications:  HOME MEDS: Prior to Admission medications   Medication Sig Start Date End Date Taking? Authorizing Provider  albuterol (PROVENTIL HFA;VENTOLIN HFA) 108 (90 BASE) MCG/ACT inhaler Inhale 2 puffs into the lungs every 4 (four) hours as needed. For cough or wheezing   Yes Historical Provider, MD  aspirin 81 MG  tablet Take 160 mg by mouth daily.   Yes Historical Provider, MD  cefdinir (OMNICEF) 300 MG capsule Take 300 mg by mouth 2 (two) times daily.   Yes Historical Provider, MD  chlorpheniramine-HYDROcodone (TUSSIONEX) 10-8 MG/5ML LQCR Take 5 mLs by mouth every 12 (twelve) hours as needed. For cough   Yes Historical Provider, MD  ezetimibe (ZETIA) 10 MG tablet Take 10 mg by mouth daily.   Yes Historical Provider, MD  METOPROLOL TARTRATE PO Take 1 tablet by mouth 2 (two) times daily.   Yes Historical Provider, MD  predniSONE (DELTASONE) 10 MG tablet Take 10 mg by mouth 2 (two) times daily.   Yes Historical Provider, MD  raloxifene (EVISTA) 60 MG tablet Take 60 mg by mouth daily.   Yes Historical Provider, MD  Simvastatin (ZOCOR PO) Take 1 tablet by mouth daily.   Yes Historical Provider, MD  Vitamin D, Ergocalciferol, (DRISDOL) 50000 UNITS CAPS Take 50,000 Units by mouth every 7 (seven) days.   Yes Historical Provider, MD    Allergies:  Allergies  Allergen Reactions  . Codeine Nausea And Vomiting  . Hydrocodone Other (See Comments)    delusions    Social History:   reports that she has never smoked. She has never used smokeless tobacco. She reports that she does not drink alcohol or  use illicit drugs.  Family History: No family history on file.  Review of Systems:  The patient denies anorexia, fever, weight loss, vision loss, decreased hearing, hoarseness, chest pain, syncope,  peripheral edema, balance deficits, hemoptysis, abdominal pain, melena, hematochezia, severe indigestion/heartburn, hematuria, incontinence, genital sores, muscle weakness, suspicious skin lesions, transient blindness, difficulty walking, depression, unusual weight change, abnormal bleeding, enlarged lymph nodes, angioedema, and breast masses.   Physical Exam:  GEN:  Somnulent tachypneic  Elderly 78 year old Caucasian female examined and in mild distress Filed Vitals:   05/19/11 2135 05/19/11 2218 05/19/11 2312  05/19/11 2350  BP: 138/66 125/89 161/82 133/89  Pulse:  71 74   Temp:   97.6 F (36.4 C)   TempSrc:   Oral   Resp: 30  22   Height:   5\' 5"  (1.651 m)   Weight:   94.6 kg (208 lb 8.9 oz)   SpO2: 92% 92% 94%    Blood pressure 133/89, pulse 74, temperature 97.6 F (36.4 C), temperature source Oral, resp. rate 22, height 5\' 5"  (1.651 m), weight 94.6 kg (208 lb 8.9 oz), SpO2 94.00%. PSYCH: She is somnulent but arousable and mildly confused but oriented x3; does not appear anxious does not appear depressed; affect is normal HEENT: Normocephalic and Atraumatic, Mucous membranes pink; PERRLA; EOM intact; Fundi:  Benign;  No scleral icterus, Nares: Patent, Oropharynx: Clear, Neck:  FROM, no cervical lymphadenopathy nor thyromegaly or carotid bruit; no JVD; Breasts:: Not examined CHEST WALL: No tenderness CHEST: Normal respiration, clear to auscultation bilaterally HEART: Regular rate and rhythm; no murmurs rubs or gallops BACK: No kyphosis or scoliosis; no CVA tenderness ABDOMEN: Positive Bowel Sounds, Obese, soft non-tender; no masses, no organomegaly, no pannus; no intertriginous candida. Rectal Exam: Not done EXTREMITIES: +Hyperpigmentation of BLEs (consistent with Venous Stasis Dermatitis)  Otherwise no cyanosis, clubbing or edema; no ulcerations. Genitalia: not examined PULSES: 2+ and symmetric SKIN: Normal hydration no rash or ulceration CNS: Cranial nerves 2-12 grossly intact no focal neurologic deficit   Labs & Imaging Results for orders placed during the hospital encounter of 05/19/11 (from the past 48 hour(s))  CBC     Status: Abnormal   Collection Time   05/19/11  6:36 PM      Component Value Range Comment   WBC 9.0  4.0 - 10.5 (K/uL)    RBC 5.43 (*) 3.87 - 5.11 (MIL/uL)    Hemoglobin 14.4  12.0 - 15.0 (g/dL)    HCT 16.1  09.6 - 04.5 (%)    MCV 82.1  78.0 - 100.0 (fL)    MCH 26.5  26.0 - 34.0 (pg)    MCHC 32.3  30.0 - 36.0 (g/dL)    RDW 40.9  81.1 - 91.4 (%)    Platelets 160   150 - 400 (K/uL)   PRO B NATRIURETIC PEPTIDE     Status: Abnormal   Collection Time   05/19/11  6:36 PM      Component Value Range Comment   Pro B Natriuretic peptide (BNP) 13423.0 (*) 0 - 450 (pg/mL)   BASIC METABOLIC PANEL     Status: Abnormal   Collection Time   05/19/11  6:36 PM      Component Value Range Comment   Sodium 134 (*) 135 - 145 (mEq/L)    Potassium 4.3  3.5 - 5.1 (mEq/L)    Chloride 95 (*) 96 - 112 (mEq/L)    CO2 28  19 - 32 (mEq/L)    Glucose, Bld  136 (*) 70 - 99 (mg/dL)    BUN 26 (*) 6 - 23 (mg/dL)    Creatinine, Ser 4.09  0.50 - 1.10 (mg/dL)    Calcium 9.4  8.4 - 10.5 (mg/dL)    GFR calc non Af Amer 80 (*) >90 (mL/min)    GFR calc Af Amer >90  >90 (mL/min)   CARDIAC PANEL(CRET KIN+CKTOT+MB+TROPI)     Status: Abnormal   Collection Time   05/19/11  6:36 PM      Component Value Range Comment   Total CK 97  7 - 177 (U/L)    CK, MB 8.8 (*) 0.3 - 4.0 (ng/mL)    Troponin I <0.30  <0.30 (ng/mL)    Relative Index RELATIVE INDEX IS INVALID  0.0 - 2.5    BLOOD GAS, ARTERIAL     Status: Abnormal   Collection Time   05/20/11 12:01 AM      Component Value Range Comment   O2 Content 3.0      Delivery systems NASAL CANNULA      pH, Arterial 7.322 (*) 7.350 - 7.400     pCO2 arterial 65.9 (*) 35.0 - 45.0 (mmHg)    pO2, Arterial 69.2 (*) 80.0 - 100.0 (mmHg)    Bicarbonate 33.1 (*) 20.0 - 24.0 (mEq/L)    TCO2 35.1  0 - 100 (mmol/L)    Acid-Base Excess 7.2 (*) 0.0 - 2.0 (mmol/L)    O2 Saturation 92.0      Patient temperature 98.6      Collection site RIGHT RADIAL      Drawn by [id number]      Sample type ARTERIAL DRAW      Allens test (pass/fail) PASS  PASS     Dg Chest Portable 1 View  05/19/2011  *RADIOLOGY REPORT*  Clinical Data: Short of breath  PORTABLE CHEST - 1 VIEW  Comparison: 01/16/2008  Findings: Suboptimal study due to rotation and underpenetration. The patient is large in size.  Cardiac enlargement without heart failure.  No definite pneumonia.  IMPRESSION: Limited  study.  Followup or repeat study is suggested.  No definite acute abnormality.  Original Report Authenticated By: Camelia Phenes, M.D.   EKG: not done  ABG:  7.32/ 65  /69 /33/ 92 %  Assessment: Present on Admission:  .Congestive heart failure (CHF) .Hypertension .Hypoxemia .SOB (shortness of breath) .CAD (coronary artery disease) .Hypercarbia Hyperlipidemia   Plan:          Admit to Stepdown BED  BiPAP IV Nitroglycerin drip CHF Protocol with IV Lasix BID x 2 days Nebs, O2 Cardiac Enzymes Reconcile Home Meds DVT prophylaxis Consulted PCCM Consider Outpatient evaluation for Obstructive Sleep Apnea EKG ordered and a repeat Chest X-ray in AM Other plans as per orders.    CODE STATUS:      FULL CODE       Critical care time: 60 minutes.     Linnae Rasool C 05/20/2011, 12:29 AM

## 2011-05-20 NOTE — ED Provider Notes (Signed)
I saw and evaluated the patient, reviewed the resident's note and I agree with the findings and plan.   .Face to face Exam:  General:  Awake HEENT:  Atraumatic Resp:  tachypneic Abd:  Nondistended Neuro:No focal weakness Lymph: No adenopathy   CRITICAL CARE Performed by: Nelva Nay L   Total critical care time: 30  Critical care time was exclusive of separately billable procedures and treating other patients.  Critical care was necessary to treat or prevent imminent or life-threatening deterioration.  Critical care was time spent personally by me on the following activities: development of treatment plan with patient and/or surrogate as well as nursing, discussions with consultants, evaluation of patient's response to treatment, examination of patient, obtaining history from patient or surrogate, ordering and performing treatments and interventions, ordering and review of laboratory studies, ordering and review of radiographic studies, pulse oximetry and re-evaluation of patient's condition.   Nelia Shi, MD 05/20/11 1031

## 2011-05-20 NOTE — Progress Notes (Signed)
[facility] [place] TEAM 8  Subjective: 78 y.o. female who was taken to the [facility] ED due to worsening SOB and increased lethargy.  Patient has had worsening SOB over the past week along with dyspnea on exertion. She was placed on Omnicef therapy and a prednisone taper along with inhalers for asthmatic bronchitis but her symptoms continued to worsen.  Objective: Weight change:   Intake/Output Summary (Last 24 hours) at 05/20/11 1113 Last data filed at 05/20/11 0930  Gross per 24 hour  Intake  192.5 ml  Output   2150 ml  Net -1957.5 ml   Blood pressure 140/68, pulse 64, temperature 97.6 F (36.4 C), temperature source Oral, resp. rate 20, height 5\' 5"  (1.651 m), weight 94.8 kg (208 lb 15.9 oz), SpO2 93.00%.  Physical Exam: F/U exam completed  Lab Results:  Basename 05/20/11 0520 05/20/11 0015 05/19/11 1836  NA 138 -- 134*  K 4.1 -- 4.3  CL 93* -- 95*  CO2 30 -- 28  GLUCOSE 142* -- 136*  BUN 27* -- 26*  CREATININE 0.77 0.69 0.70  CALCIUM 9.6 -- 9.4  MG -- -- --  PHOS -- -- --    Basename 05/20/11 0015 05/19/11 1836  WBC 9.2 9.0  NEUTROABS -- --  HGB 15.1* 14.4  HCT 47.5* 44.6  MCV 84.2 82.1  PLT 140* 160    Basename 05/20/11 0840 05/20/11 0013 05/19/11 1836  CKTOTAL 57 80 97  CKMB 5.9* 8.2* 8.8*  CKMBINDEX -- -- --  TROPONINI <0.30 <0.30 <0.30    Micro Results: Recent Results (from the past 240 hour(s))  MRSA PCR SCREENING     Status: Normal   Collection Time   05/19/11 11:19 PM      Component Value Range Status Comment   MRSA by PCR NEGATIVE  NEGATIVE  Final     Studies/Results: All recent x-ray/radiology reports have been reviewed in detail.   Medications: I have reviewed the patient's complete medication list.  Assessment/Plan:  Hypercapnic respiratory failure  Possible newly diagnosed CHF Markedly elevated pro-BNP at presentation  CAD s/p PTCA and stent  RML soft tissue density on CXR  HTN  Lonia Blood,  MD [facility] Office  [phone] Pager [phone]  On-Call/Text Page:      [email or web address]      password [facility]

## 2011-05-20 NOTE — Consult Note (Signed)
Name: Laura [name] MRN: [id number] DOB: 03/14/1930  DOS:  05/20/11     CRITICAL CARE MEDICINE ADMISSION / CONSULTATION NOTE  Referring Physician : Dr Lovell Sheehan  Reason For Consult : Shortness of breath.  History Of Present Illness :Laura [name] is an 78 y.o. female who was [facility] to the [facility] ED due to worsening SOB and increased lethargy. Patient has had worsening SOB over the past week along with Dyspnea on exertion. She was placed on treatments by her PCP of Omnicef anitibiotic therapy and a prednisone taper along with inhalers for asthmatic bronchitis but her symptoms continued to worsen. She reports not being able to lay flat or sleep because she can not breathe. She denies having Chest pain. She does report having a cough, abut she can not cough out any sputum. She denies having fevers or chills. She reports having orthopnea for months. She also reports in the past being sent for a sleep study to evaluate for sleep apnea, but she was unable to sleep so the study was not able to be performed.  In the ED her laboratory studies revealed a Pro-BNP of 13,423, her chest X-rays, however was inconclusive due to her positioning. She reports having CAD and had an MI with stent placement 10 years ago, but she denies having a diagnosis of CHF and also denies having Sleep apnea. Her husband and daughter are at bedside an report that she is hypersomnulent, and falls asleep often in the daytime, and snores.     Past Medical History  Diagnosis Date  . Coronary artery disease   . Myocardial infarction   . Hypertension   . Arthritis   . Asthma   . Chronic bronchitis     Past Surgical History  Procedure Date  . Coronary stent placement   . Appendectomy   . Tonsillectomy   . Knee surgery     Prior to Admission medications   Medication Sig Start Date End Date Taking? Authorizing Provider  albuterol (PROVENTIL HFA;VENTOLIN HFA) 108 (90 BASE) MCG/ACT inhaler Inhale 2 puffs into  the lungs every 4 (four) hours as needed. For cough or wheezing   Yes Historical Provider, MD  aspirin 81 MG tablet Take 160 mg by mouth daily.   Yes Historical Provider, MD  cefdinir (OMNICEF) 300 MG capsule Take 300 mg by mouth 2 (two) times daily.   Yes Historical Provider, MD  chlorpheniramine-HYDROcodone (TUSSIONEX) 10-8 MG/5ML LQCR Take 5 mLs by mouth every 12 (twelve) hours as needed. For cough   Yes Historical Provider, MD  ezetimibe (ZETIA) 10 MG tablet Take 10 mg by mouth daily.   Yes Historical Provider, MD  METOPROLOL TARTRATE PO Take 1 tablet by mouth 2 (two) times daily.   Yes Historical Provider, MD  predniSONE (DELTASONE) 10 MG tablet Take 10 mg by mouth 2 (two) times daily.   Yes Historical Provider, MD  raloxifene (EVISTA) 60 MG tablet Take 60 mg by mouth daily.   Yes Historical Provider, MD  Simvastatin (ZOCOR PO) Take 1 tablet by mouth daily.   Yes Historical Provider, MD  Vitamin D, Ergocalciferol, (DRISDOL) 50000 UNITS CAPS Take 50,000 Units by mouth every 7 (seven) days.   Yes Historical Provider, MD    Allergies  Allergen Reactions  . Codeine Nausea And Vomiting  . Hydrocodone Other (See Comments)    delusions    No family history on file.  Social History  reports that she has never smoked. She has never used smokeless  tobacco. She reports that she does not drink alcohol or use illicit drugs.  Review Of Systems  11 points review of systems is negative with an exception of listed in HPI.  Physical Examination  BP 133/89  Pulse 74  Temp(Src) 97.6 F (36.4 C) (Oral)  Resp 22  Ht 5\' 5"  (1.651 m)  Wt 94.6 kg (208 lb 8.9 oz)  BMI 34.71 kg/m2  SpO2 94%  Neuro:  AAOX3 HEENT: WNL  Heart:  RRR, no M/R/G Lungs:  Bilateral air entry, MILD wheezing + Abdomen:  Soft, non tender, bowel sounds present Extremities:  No cyanosis, clubbing or edema. Labs  Results for orders placed during the hospital encounter of 05/19/11 (from the past 24 hour(s))  CBC     Status:  Abnormal   Collection Time   05/19/11  6:36 PM      Component Value Range   WBC 9.0  4.0 - 10.5 (K/uL)   RBC 5.43 (*) 3.87 - 5.11 (MIL/uL)   Hemoglobin 14.4  12.0 - 15.0 (g/dL)   HCT 16.1  09.6 - 04.5 (%)   MCV 82.1  78.0 - 100.0 (fL)   MCH 26.5  26.0 - 34.0 (pg)   MCHC 32.3  30.0 - 36.0 (g/dL)   RDW 40.9  81.1 - 91.4 (%)   Platelets 160  150 - 400 (K/uL)  PRO B NATRIURETIC PEPTIDE     Status: Abnormal   Collection Time   05/19/11  6:36 PM      Component Value Range   Pro B Natriuretic peptide (BNP) 13423.0 (*) 0 - 450 (pg/mL)  BASIC METABOLIC PANEL     Status: Abnormal   Collection Time   05/19/11  6:36 PM      Component Value Range   Sodium 134 (*) 135 - 145 (mEq/L)   Potassium 4.3  3.5 - 5.1 (mEq/L)   Chloride 95 (*) 96 - 112 (mEq/L)   CO2 28  19 - 32 (mEq/L)   Glucose, Bld 136 (*) 70 - 99 (mg/dL)   BUN 26 (*) 6 - 23 (mg/dL)   Creatinine, Ser 7.82  0.50 - 1.10 (mg/dL)   Calcium 9.4  8.4 - 95.6 (mg/dL)   GFR calc non Af Amer 80 (*) >90 (mL/min)   GFR calc Af Amer >90  >90 (mL/min)  CARDIAC PANEL(CRET KIN+CKTOT+MB+TROPI)     Status: Abnormal   Collection Time   05/19/11  6:36 PM      Component Value Range   Total CK 97  7 - 177 (U/L)   CK, MB 8.8 (*) 0.3 - 4.0 (ng/mL)   Troponin I <0.30  <0.30 (ng/mL)   Relative Index RELATIVE INDEX IS INVALID  0.0 - 2.5   BLOOD GAS, ARTERIAL     Status: Abnormal   Collection Time   05/20/11 12:01 AM      Component Value Range   O2 Content 3.0     Delivery systems NASAL CANNULA     pH, Arterial 7.322 (*) 7.350 - 7.400    pCO2 arterial 65.9 (*) 35.0 - 45.0 (mmHg)   pO2, Arterial 69.2 (*) 80.0 - 100.0 (mmHg)   Bicarbonate 33.1 (*) 20.0 - 24.0 (mEq/L)   TCO2 35.1  0 - 100 (mmol/L)   Acid-Base Excess 7.2 (*) 0.0 - 2.0 (mmol/L)   O2 Saturation 92.0     Patient temperature 98.6     Collection site RIGHT RADIAL     Drawn by [id number]     Sample type ARTERIAL  DRAW     Allens test (pass/fail) PASS  PASS   CARDIAC PANEL(CRET KIN+CKTOT+MB+TROPI)      Status: Abnormal   Collection Time   05/20/11 12:13 AM      Component Value Range   Total CK 80  7 - 177 (U/L)   CK, MB 8.2 (*) 0.3 - 4.0 (ng/mL)   Troponin I <0.30  <0.30 (ng/mL)   Relative Index RELATIVE INDEX IS INVALID  0.0 - 2.5   CBC     Status: Abnormal   Collection Time   05/20/11 12:15 AM      Component Value Range   WBC 9.2  4.0 - 10.5 (K/uL)   RBC 5.64 (*) 3.87 - 5.11 (MIL/uL)   Hemoglobin 15.1 (*) 12.0 - 15.0 (g/dL)   HCT 40.9 (*) 81.1 - 46.0 (%)   MCV 84.2  78.0 - 100.0 (fL)   MCH 26.8  26.0 - 34.0 (pg)   MCHC 31.8  30.0 - 36.0 (g/dL)   RDW 91.4  78.2 - 95.6 (%)   Platelets 140 (*) 150 - 400 (K/uL)  CREATININE, SERUM     Status: Abnormal   Collection Time   05/20/11 12:15 AM      Component Value Range   Creatinine, Ser 0.69  0.50 - 1.10 (mg/dL)   GFR calc non Af Amer 81 (*) >90 (mL/min)   GFR calc Af Amer >90  >90 (mL/min)    Imaging  Dg Chest Portable 1 View  05/19/2011  *RADIOLOGY REPORT*  Clinical Data: Short of breath  PORTABLE CHEST - 1 VIEW  Comparison: 01/16/2008  Findings: Suboptimal study due to rotation and underpenetration. The patient is large in size.  Cardiac enlargement without heart failure.  No definite pneumonia.  IMPRESSION: Limited study.  Followup or repeat study is suggested.  No definite acute abnormality.  Original Report Authenticated By: Camelia Phenes, M.D.    Assessment Hypercapnic respiratory failure; - Pt came with the complaints of SOB over last several weeks which has got worse in the last 1 week. - History of multiple cardiac condition including CHF. - Currently much stable with saturation of 93% on 3 lit/min, RR 22/min - Given CO2 retention would suggest BIPAP use with oxygen. - Check ABG in 1 hour. - High Pro BNP , all these are probably manifestation of CHF. - Would recommend cycling EKG and cardiac enzymes. - Bronchodilators (albuterol and Atrovent) for mild wheezing (which could be due to CHF).   Catha Brow,  MD 05/20/2011, 1:47 AM

## 2011-05-20 NOTE — Progress Notes (Signed)
Inpatient Progress Note Patient ID: GERRICA CYGAN MRN: [id number] DOB/AGE: 04/23/1929 78 y.o.  Subjective: 78 y/o F adm 05/19/11 w/ incr SOB & lethargy.  She was treated for asthmatic bronchitis by her PCP w/ Inhalers, Omnicef, & Pred but continued to worsen.  She had sl cough but denied sput, fever, etc.  She is obese & appears to have OSA but was unable to do a sleep study in the past.  She reported PND, orthopnea, but denies CP.  She has hx CAD w/ prev stents and has seen DrTilley. In the ER her BNP= 13,432, ABGs w/ CO2 retension, and CXR showed obesity, cardiomegaly, CHF... Note: Daughter says she has had severe SOB/ DOE x  Yrs; she sees DrPerrini (Primary) & DrTilley (Cards)- none of their notes are avail for [place] to review at this time...  2/10> placed on BiPAP & given Lasix w/ improvement;  Looks like combination of AB, OSA, CHF...   Objective: Vital signs in last 24 hours: Filed Vitals:   05/20/11 0332 05/20/11 0400 05/20/11 0500 05/20/11 0950  BP: 100/55 99/47 140/68   Pulse: 64     Temp:      TempSrc:      Resp: 20     Height:      Weight:  94.8 kg (208 lb 15.9 oz)    SpO2: 97%  93% 93%    Intake/Output from previous day:  Intake/Output Summary (Last 24 hours) at 05/20/11 1055 Last data filed at 05/20/11 0809  Gross per 24 hour  Intake   72.5 ml  Output   2150 ml  Net -2077.5 ml    Medications: Scheduled Meds:   . sodium chloride   Intravenous Once  . ipratropium  0.5 mg Nebulization Q4H   And  . albuterol  2.5 mg Nebulization Q4H  . ipratropium  0.5 mg Nebulization Once   And  . albuterol  5 mg Nebulization Once  . albuterol  5 mg Nebulization Once  . aspirin EC  162 mg Oral Daily  . cefUROXime  500 mg Oral BID WC  . enoxaparin  40 mg Subcutaneous Q24H  . ezetimibe  10 mg Oral Daily  . furosemide  40 mg Intravenous BID  . furosemide  60 mg Intravenous Once  . hydrALAZINE  10 mg Intravenous Once  . methylPREDNISolone (SOLU-MEDROL) injection  125 mg  Intravenous Once  . metoprolol tartrate  25 mg Oral BID  . nitroGLYCERIN  0.2 mg Transdermal Daily  . nitroGLYCERIN  0.4 mg Sublingual Once  . potassium chloride  20 mEq Oral BID  . predniSONE  10 mg Oral BID WC  . raloxifene  60 mg Oral Daily  . sodium chloride  3 mL Intravenous Q12H  . Vitamin D (Ergocalciferol)  50,000 Units Oral Q7 days   Continuous Infusions:   . DISCONTD: nitroGLYCERIN 5 mcg/min (05/20/11 0300)   PRN Meds:.sodium chloride, acetaminophen, ondansetron (ZOFRAN) IV, sodium chloride  Physical Exam: Neuro: AAOX3  HEENT: thick features, no exudate Heart: RRR, gr1/6 SEM w/o rubs or gallops detected Lungs:  Exp wheezing bilat w/ rhonchi & basialr rales Abdomen: Obese, soft, non tender, bowel sounds present  Extremities: No cyanosis, clubbing or edema.  Lab Results:  Lab 05/20/11 0520 05/20/11 0015 05/19/11 1836  NA 138 -- 134*  K 4.1 -- 4.3  CL 93* -- 95*  CO2 30 -- 28  BUN 27* -- 26*  CREATININE 0.77 0.69 0.70  GLUCOSE 142* -- 136*  ]  Lab 05/20/11 0015  05/19/11 1836  HGB 15.1* 14.4  HCT 47.5* 44.6  WBC 9.2 9.0  PLT 140* 160    Culture Results: Recent Results (from the past 240 hour(s))  MRSA PCR SCREENING     Status: Normal   Collection Time   05/19/11 11:19 PM      Component Value Range Status Comment   MRSA by PCR NEGATIVE  NEGATIVE  Final     Studies/Results: Dg Chest 2 View  05/20/2011  *RADIOLOGY REPORT*  Clinical Data: Shortness of breath.  Hypoxia.  CHEST - 2 VIEW  Comparison: 05/19/2011  Findings: Technically limited study due to patient rotation and low lung volumes.  There appears to be mild cardiac enlargement with normal pulmonary vascularity.  Increased density along the right mediastinal border could represent atelectasis, consolidation, or soft tissue attenuation.  Calcified and tortuous aorta.  Blunting of the costophrenic angles suggest small effusions.  IMPRESSION: Technically limited study.  Cardiac enlargement with suggestion of  bilateral pleural effusions.  A soft tissue density versus atelectasis / consolidation in the right medial lung.  Original Report Authenticated By: Marlon Pel, M.D.   Dg Chest Portable 1 View  05/19/2011  *RADIOLOGY REPORT*  Clinical Data: Short of breath  PORTABLE CHEST - 1 VIEW  Comparison: 01/16/2008  Findings: Suboptimal study due to rotation and underpenetration. The patient is large in size.  Cardiac enlargement without heart failure.  No definite pneumonia.  IMPRESSION: Limited study.  Followup or repeat study is suggested.  No definite acute abnormality.  Original Report Authenticated By: Camelia Phenes, M.D.    Assessment/Plan: Hypercapnic respiratory failure: - Pt came with the complaints of SOB over last several weeks which has got worse in the last 1 week.  - History of multiple cardiac condition including CHF.  - Currently much stable with saturation of 93% on 3 lit/min, RR 22/min  - Given CO2 retention would suggest BIPAP use with oxygen.  - High Pro BNP , all these are probably manifestation of CHF.  - Would recommend cycling EKG and cardiac enzymes.  - Bronchodilators (albuterol and Atrovent) for mild wheezing (which could be due to CHF).   Asthmatic bronchitis> she is a never smoker; she sees DrPerrini & getting his medical hx will be helpful...  OSA> she was retaining CO2 on adm but normal HCO3 speaks against long term retention or OHS...  CAD> hx mult stents but apparently w/o prev CHF; she sees DrTilley & consulting him for background data will be helpful; needs 2DEcho...  Venous Insuffic & stasis changes> she has hx ulcers requiring UNNA boot therapy in the past...  DJD, s/p bilat TKRs...  Osteoporosis, Vit D deficiency...   Michele Mcalpine, MD 05/20/2011, 10:55 AM

## 2011-05-20 NOTE — Progress Notes (Signed)
Nitroglycerin drip stopped per orders - systolic BP < 100, 99/47; will continue to monitor

## 2011-05-20 NOTE — Progress Notes (Signed)
CRITICAL VALUE ALERT  Critical value received: CK-MB 8.3   Date of notification:  05/20/2011  Time of notification:  0125  Critical value read back:yes  Nurse who received alert:  B. Katrinka Blazing  MD notified (1st page):  M. Lynch  Time of first page:  0150  MD notified (2nd page):  Time of second page:  Responding MD:    Time MD responded:

## 2011-05-20 NOTE — Progress Notes (Signed)
eLink Physician-Brief Progress Note Patient Name: Laura [name] DOB: [birth year] MRN: [id number]  Date of Service  05/20/2011   HPI/Events of Note  32 F admitted with findings suggestive of CHF but also with presentation concerning for OSA - has ABG 7.3/60s/60s/33 with labored breathing pattern and AMS (lethargic).  Is HD stable.  Currently receiving rx for CHF.   eICU Interventions  Plan: PCCM consult Initiate BiPAP Follow-up ABG   Intervention Category Intermediate Interventions: Respiratory distress - evaluation and management  DETERDING,ELIZABETH 05/20/2011, 12:13 AM

## 2011-05-21 LAB — BASIC METABOLIC PANEL
BUN: 48 mg/dL — ABNORMAL HIGH (ref 6–23)
Chloride: 96 mEq/L (ref 96–112)
Creatinine, Ser: 1.17 mg/dL — ABNORMAL HIGH (ref 0.50–1.10)
Glucose, Bld: 102 mg/dL — ABNORMAL HIGH (ref 70–99)
Potassium: 4.8 mEq/L (ref 3.5–5.1)

## 2011-05-21 MED ORDER — FUROSEMIDE 20 MG PO TABS
20.0000 mg | ORAL_TABLET | Freq: Every day | ORAL | Status: DC
  Administered 2011-05-21: 20 mg via ORAL
  Filled 2011-05-21: qty 1

## 2011-05-21 MED ORDER — ALBUTEROL SULFATE (5 MG/ML) 0.5% IN NEBU
2.5000 mg | INHALATION_SOLUTION | Freq: Four times a day (QID) | RESPIRATORY_TRACT | Status: DC
  Administered 2011-05-21 – 2011-05-24 (×12): 2.5 mg via RESPIRATORY_TRACT
  Filled 2011-05-21 (×13): qty 0.5

## 2011-05-21 MED ORDER — IPRATROPIUM BROMIDE 0.02 % IN SOLN
0.5000 mg | Freq: Four times a day (QID) | RESPIRATORY_TRACT | Status: DC
  Administered 2011-05-21 – 2011-05-24 (×12): 0.5 mg via RESPIRATORY_TRACT
  Filled 2011-05-21 (×13): qty 2.5

## 2011-05-21 MED ORDER — FUROSEMIDE 20 MG PO TABS
20.0000 mg | ORAL_TABLET | Freq: Every day | ORAL | Status: DC
  Administered 2011-05-23 – 2011-05-24 (×2): 20 mg via ORAL
  Filled 2011-05-21 (×2): qty 1

## 2011-05-21 NOTE — Progress Notes (Signed)
Pt transferred to 4739 via wheelchair from 3312.  Pt oriented to room and unit policies, telemetry applied and pt w/o complaints. Pt has f/c with no order for placement.  Will notify MD to have order placed to keep f/c or to d/c.  Will con't to monitor pt and will notify MD of any changes.

## 2011-05-21 NOTE — Progress Notes (Signed)
Cosign for Emily Strickland RN assessment, med admin, care plan/education, I/O, and notes. 

## 2011-05-21 NOTE — Progress Notes (Signed)
[facility] [place] TEAM 8  Interval history: 78 y.o. female who was taken to the [facility] ED due to worsening SOB and increased lethargy.  Patient has had worsening SOB over the past week along with dyspnea on exertion. She was placed on Omnicef therapy and a prednisone taper along with inhalers for asthmatic bronchitis but her symptoms continued to worsen.  Subjective: Complaining of generalized malaise and cough. Wanted to know when she will be medically stable for discharge. Husband at bedside and interested in findings of 2-D echocardiogram completed today. He endorses fatigue and shortness of breath with ambulation today.  Objective: Weight change: -2.4 kg (-5 lb 4.7 oz)  Intake/Output Summary (Last 24 hours) at 05/21/11 1449 Last data filed at 05/21/11 0357  Gross per 24 hour  Intake    220 ml  Output   1900 ml  Net  -1680 ml   Blood pressure 143/69, pulse 77, temperature 98.7 F (37.1 C), temperature source Oral, resp. rate 23, height 5\' 5"  (1.651 m), weight 92.2 kg (203 lb 4.2 oz), SpO2 93.00%.  Physical Exam: General appearance: alert, cooperative and no distress Resp: clear to auscultation bilaterally, on 2-3 L nasal cannula oxygen with saturations of 93% Cardio: regular rate and rhythm, S1, S2 normal, no murmur, click, rub or gallop GI: Abdomen soft and nontender, bowel sounds normoactive, tolerates diet Extremities: extremities normal, atraumatic, no cyanosis or edema but does have lower extremity hemosiderin changes Neurologic: Grossly normal  Lab Results:  Basename 05/21/11 0510 05/20/11 0520 05/20/11 0015 05/19/11 1836  NA 139 138 -- 134*  K 4.8 4.1 -- 4.3  CL 96 93* -- 95*  CO2 33* 30 -- 28  GLUCOSE 102* 142* -- 136*  BUN 48* 27* -- 26*  CREATININE 1.17* 0.77 0.69 --  CALCIUM 9.3 9.6 -- 9.4  MG -- -- -- --  PHOS -- -- -- --    Basename 05/20/11 0015 05/19/11 1836  WBC 9.2 9.0  NEUTROABS -- --  HGB 15.1* 14.4  HCT 47.5* 44.6  MCV  84.2 82.1  PLT 140* 160    Basename 05/20/11 1601 05/20/11 0840 05/20/11 0013  CKTOTAL 65 57 80  CKMB 5.1* 5.9* 8.2*  CKMBINDEX -- -- --  TROPONINI <0.30 <0.30 <0.30    Micro Results: Recent Results (from the past 240 hour(s))  MRSA PCR SCREENING     Status: Normal   Collection Time   05/19/11 11:19 PM      Component Value Range Status Comment   MRSA by PCR NEGATIVE  NEGATIVE  Final     Medications: I have reviewed the patient's current medications.  Assessment/Plan:  Hypoxemic and Hypercapnic respiratory failure *Off BiPAP is still requiring nasal cannula oxygen, not on oxygen prior to admission *No clear evidence of infiltrate or opacity on x-ray that would be suggestive of an infectious process so we'll DC Ceftin *No evidence of asthmatic type exacerbation so will discontinue prednisone *Continue supportive care with pulmonary toileting, mobilization and nebulizers *Repeat chest x-ray in the morning  Possible newly diagnosed CHF *Markedly elevated pro-BNP at presentation *2-D echocardiogram results pending this admission *Currently negative balance 3390 cc  Acute renal insufficiency *Likely patient now euvolemic-dry from recent aggressive diuresis *We'll decrease Lasix from 40 mg IV every 12 hours to 20 mg by mouth daily starting tomorrow first dose  CAD s/p PTCA and stent *Cardiac panel negative x3 this admission and no evidence of ischemia on EKG *Continue beta blocker, aspirin, and nitroglycerin patch  RML soft tissue density on CXR *Chest x-ray reviewed and current films are of poor quality  *will recheck pa/lat CXR in AM - may need to consider CT chest   HTN *Blood pressure controlled so would continue beta blocker  Disposition Transfer to telemetry  Junious Silk ANP [facility] Office  [phone] Pager [phone]  On-Call/Text Page:      [email or web address]      password [email or web address]  I have personally examined this patient and reviewed the entire  database. I have reviewed the above note, made any necessary editorial changes, and agree with its content.  Lonia Blood, MD [facility]

## 2011-05-21 NOTE — Progress Notes (Signed)
*  PRELIMINARY RESULTS* Echocardiogram 2D Echocardiogram has been performed.  Glean Salen [facility] 05/21/2011, 10:11 AM

## 2011-05-22 ENCOUNTER — Inpatient Hospital Stay (HOSPITAL_COMMUNITY): Payer: Medicare Other

## 2011-05-22 DIAGNOSIS — N390 Urinary tract infection, site not specified: Secondary | ICD-10-CM | POA: Diagnosis present

## 2011-05-22 LAB — BASIC METABOLIC PANEL
BUN: 41 mg/dL — ABNORMAL HIGH (ref 6–23)
CO2: 34 mEq/L — ABNORMAL HIGH (ref 19–32)
Calcium: 8.9 mg/dL (ref 8.4–10.5)
Chloride: 99 mEq/L (ref 96–112)
Creatinine, Ser: 0.95 mg/dL (ref 0.50–1.10)
Glucose, Bld: 98 mg/dL (ref 70–99)

## 2011-05-22 LAB — CBC
HCT: 42.4 % (ref 36.0–46.0)
MCH: 28.8 pg (ref 26.0–34.0)
MCHC: 31.6 g/dL (ref 30.0–36.0)
MCV: 91 fL (ref 78.0–100.0)
RDW: 17.2 % — ABNORMAL HIGH (ref 11.5–15.5)

## 2011-05-22 LAB — URINALYSIS, ROUTINE W REFLEX MICROSCOPIC
Bilirubin Urine: NEGATIVE
Ketones, ur: NEGATIVE mg/dL
Protein, ur: NEGATIVE mg/dL
Specific Gravity, Urine: 1.014 (ref 1.005–1.030)
Urobilinogen, UA: 0.2 mg/dL (ref 0.0–1.0)

## 2011-05-22 LAB — URINE MICROSCOPIC-ADD ON

## 2011-05-22 MED ORDER — CIPROFLOXACIN HCL 250 MG PO TABS
250.0000 mg | ORAL_TABLET | Freq: Two times a day (BID) | ORAL | Status: DC
  Administered 2011-05-22 – 2011-05-24 (×4): 250 mg via ORAL
  Filled 2011-05-22 (×6): qty 1

## 2011-05-22 NOTE — Progress Notes (Signed)
Utilization Review Completed.Laura [name] T2/03/2012   

## 2011-05-22 NOTE — Progress Notes (Signed)
Cosign for Emily Strickland RN assessment, med admin, notes, care plan/education, and I/O  

## 2011-05-22 NOTE — Evaluation (Signed)
Physical Therapy Evaluation Patient Details Name: Laura [name] MRN: [id number] DOB: 1929/07/29 Today's Date: 05/22/2011  Problem List:  Patient Active Problem List  Diagnoses  . Congestive heart failure (CHF)  . Hypertension  . Hypoxemia  . SOB (shortness of breath)  . CAD (coronary artery disease)  . Hyperlipidemia  . Hypercarbia    Past Medical History:  Past Medical History  Diagnosis Date  . Coronary artery disease   . Myocardial infarction   . Hypertension   . Arthritis   . Asthma   . Chronic bronchitis    Past Surgical History:  Past Surgical History  Procedure Date  . Coronary stent placement   . Appendectomy   . Tonsillectomy   . Knee surgery     PT Assessment/Plan/Recommendation PT Assessment Clinical Impression Statement: Pt with CHF. Pt reports only using a cane sometimes at home, when walking today she had a very narrow BOS, abnormal gait pattern and was unsteady with 1 person hand held assist. Suggested pt use RW when first going home for safety and patient agrees. PT recommends HHPT for follow up PT services at D/C, pt requests that Al[facility] Chou be her HHPT.  PT Recommendation/Assessment: Patient will need skilled PT in the acute care venue PT Problem List: Decreased strength;Decreased range of motion;Decreased activity tolerance;Decreased balance;Decreased mobility;Decreased coordination;Decreased safety awareness;Decreased knowledge of use of DME;Decreased knowledge of precautions PT Therapy Diagnosis : Difficulty walking;Abnormality of gait;Generalized weakness PT Plan PT Frequency: Min 3X/week PT Treatment/Interventions: DME instruction;Gait training;Stair training;Functional mobility training;Therapeutic activities;Therapeutic exercise;Balance training;Patient/family education PT Recommendation Follow Up Recommendations: Home health PT Equipment Recommended: None recommended by PT (has RW and cane at home) PT Goals  Acute Rehab PT Goals PT  Goal Formulation: With patient/family Time For Goal Achievement: 7 days Pt will go Sit to Stand: Independently PT Goal: Sit to Stand - Progress: Goal set today Pt will go Stand to Sit: Independently PT Goal: Stand to Sit - Progress: Goal set today Pt will Transfer Bed to Chair/Chair to Bed: Independently PT Transfer Goal: Bed to Chair/Chair to Bed - Progress: Goal set today Pt will Ambulate: >150 feet;with least restrictive assistive device (with better gait pattern) PT Goal: Ambulate - Progress: Goal set today Pt will Go Up / Down Stairs: 3-5 stairs;with supervision;with rail(s) PT Goal: Up/Down Stairs - Progress: Goal set today Pt will Perform Home Exercise Program: Independently PT Goal: Perform Home Exercise Program - Progress: Goal set today  PT Evaluation Precautions/Restrictions  Fall Prior Functioning  Home Living Lives With: Spouse Receives Help From: Family Type of Home: Other (Comment) (townhouse) Home Layout: Two level;Able to live on main level with bedroom/bathroom (dont go upstairs) Alternate Level Stairs-Rails: Can reach both Alternate Level Stairs-Number of Steps: 12 Home Access: Stairs to enter Entrance Stairs-Rails: Can reach both Entrance Stairs-Number of Steps: 2 Bathroom Shower/Tub: Health visitor: Standard Bathroom Accessibility: No Home Adaptive Equipment: Straight cane;Walker - four wheeled;Bedside commode/3-in-1 Prior Function Level of Independence: Independent with basic ADLs;Independent with homemaking with ambulation;Independent with transfers;Independent with gait Driving: Yes Vocation: Retired Producer, television/film/video: Awake/alert Overall Cognitive Status: Appears within functional limits for tasks assessed Sensation/Coordination Sensation Light Touch: Appears Intact Stereognosis: Not tested Hot/Cold: Not tested Proprioception: Not tested Coordination Gross Motor Movements are Fluid and Coordinated: Yes Fine  Motor Movements are Fluid and Coordinated: Yes Extremity Assessment RUE Assessment RUE Assessment: Within Functional Limits LUE Assessment LUE Assessment: Within Functional Limits RLE Assessment RLE Assessment: Within Functional Limits LLE Assessment LLE Assessment: Within Functional  Limits (hip has pain while testing from fall in Dec but still [facility]) Mobility (including Balance) Bed Mobility Bed Mobility: No Transfers Transfers: Yes Sit to Stand: 4: Min assist;From chair/3-in-1;With armrests Sit to Stand Details (indicate cue type and reason): a little unsteady once standing but didnt take long to stabilize  Stand to Sit: 5: Supervision Stand to Sit Details: able to controll decent into chair, needing cueing on hand placements  Ambulation/Gait Ambulation/Gait: Yes Ambulation/Gait Assistance: 4: Min assist Ambulation/Gait Assistance Details (indicate cue type and reason): a few LOB while walking, suggested using a RW when first D/C home for safety Ambulation Distance (Feet): 100 Feet Assistive device: 1 person hand held assist Gait Pattern: Decreased stride length;Decreased step length - right;Decreased step length - left;Decreased hip/knee flexion - left;Right circumduction;Step-to pattern Stairs: No Wheelchair Mobility Wheelchair Mobility: No  Posture/Postural Control Posture/Postural Control: No significant limitations Balance Balance Assessed: No End of Session PT - End of Session Equipment Utilized During Treatment: Gait belt Activity Tolerance: Patient tolerated treatment well Patient left: with call bell in reach;in chair;with family/visitor present Nurse Communication: Mobility status for transfers General Behavior During Session: [facility] for tasks performed Cognition: [facility] for tasks performed  Elvera Bicker 05/22/2011, 12:54 PM

## 2011-05-22 NOTE — Progress Notes (Signed)
Subjective: Patient feeling improved overall.  Still weak but feels better.  Shortness of breath improved.  Objective: Vital signs in last 24 hours: Filed Vitals:   05/22/11 0447 05/22/11 0848 05/22/11 1005 05/22/11 1446  BP: 116/70  115/71 115/62  Pulse: 75  82 73  Temp: 97.9 F (36.6 C)  97.6 F (36.4 C) 98.1 F (36.7 C)  TempSrc:   Oral Oral  Resp: 20  18 20   Height: 5\' 5"  (1.651 m)     Weight:      SpO2: 95% 91% 93% 95%   Weight change: 2.2 kg (4 lb 13.6 oz)  Intake/Output Summary (Last 24 hours) at 05/22/11 1556 Last data filed at 05/22/11 1318  Gross per 24 hour  Intake   1078 ml  Output   2775 ml  Net  -1697 ml    Physical Exam: General: Awake, Oriented, No acute distress. HEENT: EOMI. Neck: Supple CV: S1 and S2 Lungs: Scattered crackles at bases bilaterally. Abdomen: Soft, Nontender, Nondistended, +bowel sounds. Ext: Good pulses. Trace edema.  Lab Results:  [facility] 05/22/11 0535 05/21/11 0510  NA 141 139  K 3.9 4.8  CL 99 96  CO2 34* 33*  GLUCOSE 98 102*  BUN 41* 48*  CREATININE 0.95 1.17*  CALCIUM 8.9 9.3  MG -- --  PHOS -- --   No results found for this basename: AST:2,ALT:2,ALKPHOS:2,BILITOT:2,PROT:2,ALBUMIN:2 in the last 72 hours No results found for this basename: LIPASE:2,AMYLASE:2 in the last 72 hours  Basename 05/22/11 0535 05/20/11 0015  WBC 6.8 9.2  NEUTROABS -- --  HGB 13.4 15.1*  HCT 42.4 47.5*  MCV 91.0 84.2  PLT 157 140*    Basename 05/20/11 1601 05/20/11 0840 05/20/11 0013  CKTOTAL 65 57 80  CKMB 5.1* 5.9* 8.2*  CKMBINDEX -- -- --  TROPONINI <0.30 <0.30 <0.30   No components found with this basename: POCBNP:3 No results found for this basename: DDIMER:2 in the last 72 hours No results found for this basename: HGBA1C:2 in the last 72 hours No results found for this basename: CHOL:2,HDL:2,LDLCALC:2,TRIG:2,CHOLHDL:2,LDLDIRECT:2 in the last 72 hours  Basename 05/20/11 0015  TSH 2.086  T4TOTAL --  T3FREE --  THYROIDAB  --   No results found for this basename: VITAMINB12:2,FOLATE:2,FERRITIN:2,TIBC:2,IRON:2,RETICCTPCT:2 in the last 72 hours  Micro Results: Recent Results (from the past 240 hour(s))  MRSA PCR SCREENING     Status: Normal   Collection Time   05/19/11 11:19 PM      Component Value Range Status Comment   MRSA by PCR NEGATIVE  NEGATIVE  Final     Studies/Results: Dg Chest 2 View  05/22/2011  *RADIOLOGY REPORT*  Clinical Data: Cough, shortness of breath.  CHEST - 2 VIEW  Comparison: 05/20/2011.  Findings: Cardiomegaly.  Left lower lobe opacity, likely compressive atelectasis.  Right lung is clear.  Probable small effusions.  No acute bony abnormality.  IMPRESSION: Cardiolmegaly, left base atelectasis, and small bilateral effusions.  Original Report Authenticated By: Cyndie Chime, M.D.    Medications: I have reviewed the patient's current medications. Scheduled Meds:   . ipratropium  0.5 mg Nebulization QID   And  . albuterol  2.5 mg Nebulization QID  . aspirin EC  162 mg Oral Daily  . enoxaparin  40 mg Subcutaneous Q24H  . ezetimibe  10 mg Oral Daily  . furosemide  20 mg Oral Daily  . metoprolol tartrate  25 mg Oral BID  . nitroGLYCERIN  0.2 mg Transdermal Daily  . raloxifene  60  mg Oral Daily  . sodium chloride  3 mL Intravenous Q12H  . Vitamin D (Ergocalciferol)  50,000 Units Oral Q7 days  . DISCONTD: furosemide  20 mg Oral Daily   Continuous Infusions:  PRN Meds:.sodium chloride, acetaminophen, guaiFENesin-dextromethorphan, ondansetron (ZOFRAN) IV, sodium chloride  Assessment/Plan: Hypoxemic and Hypercapnic respiratory failure  *Off BiPAP is still requiring nasal cannula oxygen, not on oxygen prior to admission, improved. *Patient had a repeat chest x-ray on 05/22/2011 which showed again no clear evidence of infiltrate or opacity on x-ray that would be suggestive of an infectious process. *No evidence of asthmatic type exacerbation so prednisone was discontinued. *Continue  supportive care with pulmonary toileting, mobilization and nebulizers   Possible newly diagnosed CHF  *Patient had a 2-D echocardiogram on 05/21/2011 which showed ejection fraction was 50-55%. *Suspect patient may have diastolic heart failure.  Continue furosemide 20 mg daily.  Was on furosemide 40 mg IV twice daily. *Currently negative balance -5.3L since admission.   Acute renal insufficiency  *Likely patient now euvolemic-dry from recent aggressive diuresis  *Renal function stable.  Urinary tract infection *Foley catheter discontinued. *Start the patient on ciprofloxacin 250 mg by mouth twice daily.  Follow urine culture.  CAD s/p PTCA and stent  *Cardiac panel negative x3 this admission and no evidence of ischemia on EKG  *Continue beta blocker, aspirin, and nitroglycerin patch   RML soft tissue density on CXR  *Chest x-ray reviewed and current films are of poor quality  *Repeat chest x-ray negative, may need to consider CT chest as outpatient.  HTN  *Blood pressure controlled so would continue beta blocker and nitroglycerin patch.  Generalized weakness *Patient evaluated by physical therapy recommending home health physical therapy, however the patient has very limited help at home per daughter Gaylyn Lambert, [phone], so the patient may benefit from going to skilled nursing facility.  Disposition  *Home with home health PT versus skilled nursing facility.  Pending, please contact patient's daughter Gaylyn Lambert, [phone] and updated the daughter.   LOS: 3 days  Armen Waring A, MD 05/22/2011, 3:56 PM

## 2011-05-22 NOTE — Evaluation (Signed)
Laura [name],PT Acute Rehabilitation [phone] (pager)  

## 2011-05-23 LAB — BASIC METABOLIC PANEL
BUN: 28 mg/dL — ABNORMAL HIGH (ref 6–23)
CO2: 36 mEq/L — ABNORMAL HIGH (ref 19–32)
Chloride: 98 mEq/L (ref 96–112)
Creatinine, Ser: 0.89 mg/dL (ref 0.50–1.10)
GFR calc Af Amer: 70 mL/min — ABNORMAL LOW (ref >90)

## 2011-05-23 LAB — URINE CULTURE

## 2011-05-23 MED ORDER — GUAIFENESIN ER 600 MG PO TB12
600.0000 mg | ORAL_TABLET | Freq: Two times a day (BID) | ORAL | Status: DC
  Administered 2011-05-23 – 2011-05-24 (×2): 600 mg via ORAL
  Filled 2011-05-23 (×3): qty 1

## 2011-05-23 NOTE — Progress Notes (Signed)
Have collaborated with this note and agree.  05/23/2011  Glenwood Bing, PT [phone] (pager)

## 2011-05-23 NOTE — Progress Notes (Signed)
Pt O2 sats 98% on 2L at rest. Pt O2 sats 84% on RA while ambulating. Pt placed 2L O2 via [place] and O2 sats 96% while ambulating. Will continue to monitor.

## 2011-05-23 NOTE — Progress Notes (Signed)
Cosign for Laura Strickland RN assessment, med admin, care plan/pathway, notes, and I/O 

## 2011-05-23 NOTE — Progress Notes (Signed)
SPOKE WITH PT AND HER HUSBAND ABOUT DC/ HH NEEDS.  THEY STATED THAT OT HAD JUST LEFT AND RECOMMENDED CARDIAC REHAB, HOWEVER PT RECOMMENDED HH PT.  THE PT STATED THAT SHE HAS HAD HH PT IN THE PAST AND WOULD PREFER [facility] WITH [facility].  PT ON 2 LNC AND MAY NEED HOME 02.  I HAVE ASKED THE AIDE TO INFORM THE NURSE TO DOCUMENT SATS TO DETERMINE NEED FOR HOME 02.  MESSAGE LEFT FOR OT TO CLARIFY DC NEED OF CARDIAC REHAB VS HH PT.  WILL F/U AFTER RETURN OF CALL. Willa Rough 05/23/2011 [phone] OR [phone]

## 2011-05-23 NOTE — Progress Notes (Signed)
Subjective: Patient feeling a lot better and stronger overall.  Shortness of breath improved but with O2 sat desaturation with exertion (down to 86%).  Objective: Vital signs in last 24 hours: Filed Vitals:   05/23/11 1002 05/23/11 1100 05/23/11 1335 05/23/11 1608  BP:  104/62 126/57   Pulse: 69 70 77   Temp:   98.5 F (36.9 C)   TempSrc:   Oral   Resp:  18 18   Height:      Weight:      SpO2: 92%  98% 93%   Weight change: 1 kg (2 lb 3.3 oz)  Intake/Output Summary (Last 24 hours) at 05/23/11 1826 Last data filed at 05/23/11 1817  Gross per 24 hour  Intake    880 ml  Output   1801 ml  Net   -921 ml    Physical Exam: General: Awake, Oriented, No acute distress. HEENT: EOMI. Neck: Supple CV: S1 and S2 Lungs: CTA bilaterally; no rales or crackles Abdomen: Soft, Nontender, Nondistended, +bowel sounds. Ext: Good pulses. Trace edema.  Lab Results:  [facility] 05/23/11 0633 05/22/11 0535  NA 142 141  K 4.5 3.9  CL 98 99  CO2 36* 34*  GLUCOSE 101* 98  BUN 28* 41*  CREATININE 0.89 0.95  CALCIUM 9.5 8.9  MG -- --  PHOS -- --    Basename 05/22/11 0535  WBC 6.8  NEUTROABS --  HGB 13.4  HCT 42.4  MCV 91.0  PLT 157    Micro Results: Recent Results (from the past 240 hour(s))  MRSA PCR SCREENING     Status: Normal   Collection Time   05/19/11 11:19 PM      Component Value Range Status Comment   MRSA by PCR NEGATIVE  NEGATIVE  Final   URINE CULTURE     Status: Normal   Collection Time   05/22/11 12:53 PM      Component Value Range Status Comment   Specimen Description URINE, CLEAN CATCH   Final    Special Requests NONE   Final    Culture  Setup Time [id number]   Final    Colony Count 60,000 COLONIES/ML   Final    Culture     Final    Value: Multiple bacterial morphotypes present, none predominant. Suggest appropriate recollection if clinically indicated.   Report Status 05/23/2011 FINAL   Final     Studies/Results: Dg Chest 2 View  05/22/2011  *RADIOLOGY  REPORT*  Clinical Data: Cough, shortness of breath.  CHEST - 2 VIEW  Comparison: 05/20/2011.  Findings: Cardiomegaly.  Left lower lobe opacity, likely compressive atelectasis.  Right lung is clear.  Probable small effusions.  No acute bony abnormality.  IMPRESSION: Cardiolmegaly, left base atelectasis, and small bilateral effusions.  Original Report Authenticated By: Cyndie Chime, M.D.    Medications: I have reviewed the patient's current medications. Scheduled Meds:    . ipratropium  0.5 mg Nebulization QID   And  . albuterol  2.5 mg Nebulization QID  . aspirin EC  162 mg Oral Daily  . ciprofloxacin  250 mg Oral BID  . enoxaparin  40 mg Subcutaneous Q24H  . ezetimibe  10 mg Oral Daily  . furosemide  20 mg Oral Daily  . guaiFENesin  600 mg Oral BID  . metoprolol tartrate  25 mg Oral BID  . nitroGLYCERIN  0.2 mg Transdermal Daily  . raloxifene  60 mg Oral Daily  . sodium chloride  3 mL Intravenous Q12H  .  Vitamin D (Ergocalciferol)  50,000 Units Oral Q7 days   Continuous Infusions:  PRN Meds:.sodium chloride, acetaminophen, ondansetron (ZOFRAN) IV, sodium chloride, DISCONTD: guaiFENesin-dextromethorphan  Assessment/Plan: Hypoxemic and Hypercapnic respiratory failure  Improved and now on [place] oxygen. Will check patient needs on ambulation and a rest with oxygen and also at RA; if needed will arrange for home oxygen.   Possible newly diagnosed CHF  Continue Laxis 20 mg daily.   Acute renal insufficiency  Renal function stable and pretty much back to baseline.  Urinary tract infection Urine cx pending; continue PO antibiotics; no fever and no dysuria.  CAD s/p PTCA and stent  Continue current home regimen; no signs of ACS.  RML soft tissue density on CXR  Repeat chest x-ray negative, may need to consider CT chest as outpatient if needed.  HTN  Blood pressure controlled so would continue beta blocker and nitroglycerin patch.  Generalized weakness Will arrange for HHPT and  also for cardiac rehab.  Disposition Home tomorrow most likely; with HHPT and HHOT.    LOS: 4 days  Chanon Loney, MD 05/23/2011, 6:26 PM

## 2011-05-23 NOTE — Evaluation (Signed)
Occupational Therapy Evaluation Patient Details Name: Laura [name] MRN: [id number] DOB: [birth year] Today's Date: 05/23/2011  Problem List:  Patient Active Problem List  Diagnoses  . Congestive heart failure (CHF)  . Hypertension  . Hypoxemia  . SOB (shortness of breath)  . CAD (coronary artery disease)  . Hyperlipidemia  . Hypercarbia  . UTI (urinary tract infection)    Past Medical History:  Past Medical History  Diagnosis Date  . Coronary artery disease   . Myocardial infarction   . Hypertension   . Arthritis   . Asthma   . Chronic bronchitis    Past Surgical History:  Past Surgical History  Procedure Date  . Coronary stent placement   . Appendectomy   . Tonsillectomy   . Knee surgery     OT Assessment/Plan/Recommendation OT Assessment Clinical Impression Statement: Pt s/p admission for SOB and hypoxia with deficits in areas of activity tolerance and safety on feet affecting independence with all adls.  Pt would benefit from cont OT to increase I with adls to mod I level so pt can d/c home with husband. OT Recommendation/Assessment: Patient will need skilled OT in the acute care venue OT Problem List: Decreased activity tolerance;Decreased safety awareness Barriers to Discharge: None OT Therapy Diagnosis : Generalized weakness OT Plan OT Frequency: Min 2X/week OT Treatment/Interventions: Self-care/ADL training;Energy conservation;Therapeutic activities OT Recommendation Follow Up Recommendations: No OT follow up;Other (comment) (Pt may benefit from cardiac rehab at d/c) Equipment Recommended: None recommended by OT Individuals Consulted Consulted and Agree with Results and Recommendations: Family member/caregiver Family Member Consulted: husband and daughter OT Goals Acute Rehab OT Goals OT Goal Formulation: With patient/family Time For Goal Achievement: 7 days ADL Goals Pt Will Perform Grooming: with modified independence;Standing at sink ADL Goal:  Grooming - Progress: Goal set today Pt Will Perform Lower Body Dressing: with modified independence;Sit to stand from chair;Other (comment) (gathering own clothes with walker) ADL Goal: Lower Body Dressing - Progress: Goal set today Pt Will Perform Tub/Shower Transfer: with modified independence;Grab bars;Ambulation ADL Goal: Tub/Shower Transfer - Progress: Goal set today Additional ADL Goal #1: Pt will shower standing using grab rails with mod I. ADL Goal: Additional Goal #1 - Progress: Goal set today Additional ADL Goal #2: Pt will complete all aspects of toileting with 3:1 over commode with mod I. ADL Goal: Additional Goal #2 - Progress: Goal set today Miscellaneous OT Goals Miscellaneous OT Goal #1: Pt will state 4 things she can do differently at home to conserve energy after d/c. OT Goal: Miscellaneous Goal #1 - Progress: Goal set today  OT Evaluation Precautions/Restrictions  Precautions Required Braces or Orthoses: No Restrictions Weight Bearing Restrictions: No Prior Functioning Home Living Lives With: Spouse Receives Help From: Family Type of Home: Other (Comment) (Town home) Home Layout: Two level;Able to live on main level with bedroom/bathroom Alternate Level Stairs-Rails: Can reach both Alternate Level Stairs-Number of Steps: 12 Home Access: Stairs to enter Entrance Stairs-Rails: Can reach both Entrance Stairs-Number of Steps: 2 Bathroom Shower/Tub: Health visitor: Standard Home Adaptive Equipment: Straight cane;Walker - four wheeled;Bedside commode/3-in-1 Prior Function Level of Independence: Independent with basic ADLs;Independent with homemaking with ambulation;Independent with transfers;Independent with gait Able to Take Stairs?: Yes Driving: Yes Vocation: Retired Leisure: Hobbies-yes (Comment) Comments: Church ADL ADL Eating/Feeding: Performed;Independent Where Assessed - Eating/Feeding: Chair Grooming: Performed;Teeth care;Brushing  hair;Supervision/safety Grooming Details (indicate cue type and reason): Pt needed to sit in the middle secondary to fatigue. Where Assessed - Grooming: Standing at sink;Sitting at  sink Upper Body Bathing: Simulated;Supervision/safety Where Assessed - Upper Body Bathing: Sitting, chair Lower Body Bathing: Simulated;Supervision/safety Lower Body Bathing Details (indicate cue type and reason): cues to bring legs up toward her instead of leaning foward to help breathing. Where Assessed - Lower Body Bathing: Sit to stand from chair Upper Body Dressing: Simulated;Supervision/safety Where Assessed - Upper Body Dressing: Sitting, chair Lower Body Dressing: Performed;Supervision/safety Lower Body Dressing Details (indicate cue type and reason): cues to cross legs for socks and shoes and S to stand. Where Assessed - Lower Body Dressing: Sit to stand from chair Toilet Transfer: Performed;Supervision/safety Toilet Transfer Details (indicate cue type and reason): cues to use 3:1 over commode at home to conserve energy getting up and down. Toilet Transfer Method: Proofreader: Comfort height toilet;Grab bars Toileting - Clothing Manipulation: Performed;Supervision/safety Where Assessed - Glass blower/designer Manipulation: Standing Toileting - Hygiene: Performed;Modified independent Where Assessed - Toileting Hygiene: Sit on 3-in-1 or toilet Tub/Shower Transfer: Performed;Supervision/safety Tub/Shower Transfer Details (indicate cue type and reason): cues provided to use grab bars.  Spoke to family about installing grab bar in home shower. Tub/Shower Transfer Method: Science writer: Grab bars Equipment Used: Rolling walker Ambulation Related to ADLs: Pt safe with use of rolling walker.  Fatigues easily. ADL Comments: Pt does well with adls.  Pt requires several rest breaks for all adls. Spoke with pt and family at length about energy conservation techniques  she can use at home. Vision/Perception    Cognition Cognition Arousal/Alertness: Awake/alert Overall Cognitive Status: Appears within functional limits for tasks assessed Sensation/Coordination Sensation Light Touch: Appears Intact Coordination Gross Motor Movements are Fluid and Coordinated: Yes Fine Motor Movements are Fluid and Coordinated: Yes Extremity Assessment RUE Assessment RUE Assessment: Within Functional Limits LUE Assessment LUE Assessment: Within Functional Limits Mobility  Transfers Transfers: Yes Sit to Stand: 5: Supervision;Without upper extremity assist;From chair/3-in-1;With armrests Sit to Stand Details (indicate cue type and reason): cues to get balance before walking. Stand to Sit: 5: Supervision;To chair/3-in-1;Without upper extremity assist;With armrests Stand to Sit Details: cues to reach back for chair.  Pt with uncontolled decent.  Daughter states this is a habit for this pt. Exercises   End of Session OT - End of Session Activity Tolerance: Patient limited by fatigue Patient left: in chair;with call bell in reach;with family/visitor present Nurse Communication: Mobility status for transfers General Behavior During Session: [facility] for tasks performed Cognition: [facility] for tasks performed   Hope Budds [phone] 05/23/2011, 10:15 AM

## 2011-05-23 NOTE — Progress Notes (Signed)
Physical Therapy Treatment Patient Details Name: Laura [name] MRN: [id number] DOB: 05-01-1929 Today's Date: 05/23/2011  PT Assessment/Plan  PT - Assessment/Plan Comments on Treatment Session: Pt with history of CHF. Had pt walk with RW today and she was much steadier. Pt walked without supplemental O2 and stats dropped to 86 but were back up in the 90s with one minute of rest. Left pt on nasal cannula once back in room. Pt is now supervision ambulating with RW. Adding to mobility team, please see shadow chart for those notes.  PT Plan: Discharge plan remains appropriate;Frequency remains appropriate PT Frequency: Min 3X/week Follow Up Recommendations: Home health PT Equipment Recommended: None recommended by PT PT Goals  Acute Rehab PT Goals PT Goal: Sit to Stand - Progress: Progressing toward goal PT Goal: Stand to Sit - Progress: Progressing toward goal PT Goal: Ambulate - Progress: Progressing toward goal PT Goal: Perform Home Exercise Program - Progress: Progressing toward goal  PT Treatment Precautions/Restrictions  Precautions Precautions: Fall Required Braces or Orthoses: No Restrictions Weight Bearing Restrictions: No Mobility (including Balance) Bed Mobility Bed Mobility: Yes Supine to Sit: 6: Modified independent (Device/Increase time) Sitting - Scoot to Edge of Bed: 6: Modified independent (Device/Increase time) Sit to Supine: 6: Modified independent (Device/Increase time) Transfers Transfers: Yes Sit to Stand: 5: Supervision;From bed Stand to Sit: 5: Supervision;To bed Stand to Sit Details: cueing for hand placement Ambulation/Gait Ambulation/Gait: Yes Ambulation/Gait Assistance: 5: Supervision Ambulation/Gait Assistance Details (indicate cue type and reason): much steadier gait with RW and able to walk further Ambulation Distance (Feet): 150 Feet Assistive device: Rolling walker Gait Pattern: Decreased stride length;Decreased step length - right;Decreased  step length - left Stairs: No Wheelchair Mobility Wheelchair Mobility: No  Posture/Postural Control Posture/Postural Control: No significant limitations Balance Balance Assessed: No End of Session PT - End of Session Equipment Utilized During Treatment: Gait belt Activity Tolerance: Patient tolerated treatment well Patient left: in bed;with call bell in reach;with family/visitor present General Behavior During Session: [facility] for tasks performed Cognition: [facility] for tasks performed  Elvera Bicker 05/23/2011, 3:37 PM

## 2011-05-24 LAB — BASIC METABOLIC PANEL
CO2: 37 mEq/L — ABNORMAL HIGH (ref 19–32)
Calcium: 9.4 mg/dL (ref 8.4–10.5)
Creatinine, Ser: 0.77 mg/dL (ref 0.50–1.10)
Glucose, Bld: 103 mg/dL — ABNORMAL HIGH (ref 70–99)

## 2011-05-24 MED ORDER — FUROSEMIDE 20 MG PO TABS: 20.0000 mg | ORAL_TABLET | Freq: Every day | ORAL | Status: DC

## 2011-05-24 MED ORDER — GUAIFENESIN ER 600 MG PO TB12: 600.0000 mg | ORAL_TABLET | Freq: Two times a day (BID) | ORAL | Status: AC

## 2011-05-24 MED ORDER — METOPROLOL TARTRATE 25 MG PO TABS: 25.0000 mg | ORAL_TABLET | Freq: Two times a day (BID) | ORAL | Status: DC

## 2011-05-24 MED ORDER — CIPROFLOXACIN HCL 250 MG PO TABS: 250.0000 mg | ORAL_TABLET | Freq: Two times a day (BID) | ORAL | Status: AC

## 2011-05-24 MED ORDER — "INTERDRY AG TEXTILE 10""X12' EX MISC": CUTANEOUS | Status: DC

## 2011-05-24 MED ORDER — ISOSORBIDE MONONITRATE ER 30 MG PO TB24: 30.0000 mg | ORAL_TABLET | Freq: Every day | ORAL | Status: DC

## 2011-05-24 NOTE — Progress Notes (Signed)
Pt d/c per w/c accompanied by nurse tech with all belongings and husband. Pt has home 02 and was instructed by [facility] on proper handling and operation T [facility]

## 2011-05-24 NOTE — Discharge Summary (Addendum)
Physician Discharge Summary  Patient ID: Laura [name] MRN: [id number] DOB/AGE: 08/25/1929 78 y.o.  Admit date: 05/19/2011 Discharge date: 05/24/2011  Primary Care Physician:  No primary provider on file.   Discharge Diagnoses:   .Congestive heart failure (CHF) .Hypertension .Acute resp failure with Hypoxemia and hypercapnia .SOB (shortness of breath) .CAD (coronary artery disease) .Hypercarbia .UTI (urinary tract infection)  Medication List  As of 05/24/2011 10:29 AM   STOP taking these medications         chlorpheniramine-HYDROcodone 10-8 MG/5ML Lqcr      metoprolol 100 MG tablet      predniSONE 10 MG tablet         TAKE these medications         albuterol 108 (90 BASE) MCG/ACT inhaler   Commonly known as: PROVENTIL HFA;VENTOLIN HFA   Inhale 2 puffs into the lungs every 4 (four) hours as needed. For cough or wheezing      aspirin 81 MG tablet   Take 160 mg by mouth daily.      cefdinir 300 MG capsule   Commonly known as: OMNICEF   Take 300 mg by mouth 2 (two) times daily.      ciprofloxacin 250 MG tablet   Commonly known as: CIPRO   Take 1 tablet (250 mg total) by mouth 2 (two) times daily.      ezetimibe 10 MG tablet   Commonly known as: ZETIA   Take 10 mg by mouth daily.      furosemide 20 MG tablet   Commonly known as: LASIX   Take 1 tablet (20 mg total) by mouth daily.      guaiFENesin 600 MG 12 hr tablet   Commonly known as: MUCINEX   Take 1 tablet (600 mg total) by mouth 2 (two) times daily.      [employer] 40"J81' Misc   Apply to skin folds to help with healing; leave in place for 72 hours and then exchange; use until sking is healed. Keep area clean and dry.      isosorbide mononitrate 30 MG 24 hr tablet   Commonly known as: IMDUR   Take 1 tablet (30 mg total) by mouth daily.      metoprolol tartrate 25 MG tablet   Commonly known as: LOPRESSOR   Take 1 tablet (25 mg total) by mouth 2 (two) times daily.      raloxifene 60 MG  tablet   Commonly known as: EVISTA   Take 60 mg by mouth daily.      rosuvastatin 20 MG tablet   Commonly known as: CRESTOR   Take 20 mg by mouth daily.      Vitamin D (Ergocalciferol) 50000 UNITS Caps   Commonly known as: DRISDOL   Take 50,000 Units by mouth every 7 (seven) days.             Disposition and Follow-up:  Patient has been discharged home in stable and improved condition; at this point no complaining of any chest pain and with significant improvement in her breathing especially while wearing oxygen. Due to the saturation at rest and also on exertion on room air patient has been discharge with DME oxygen to be used at all time 2 L. She will follow with her PCP in 2 weeks and also with Dr. Tresa Endo in one week for further evaluation and treatment of her congestive heart failure he and to start cardiac rehabilitation. Patient has been instructed to follow a low-sodium diet (  less than 2 g on daily basis). Patient will benefit of weight loss and this was discussed with the patient to follow a low calorie diet. He will be also important for the patient to have a sleep study done as an outpatient  Consults:  None   Significant Diagnostic Studies:  Dg Chest 2 View  05/20/2011  *RADIOLOGY REPORT*  Clinical Data: Shortness of breath.  Hypoxia.  CHEST - 2 VIEW  Comparison: 05/19/2011  Findings: Technically limited study due to patient rotation and low lung volumes.  There appears to be mild cardiac enlargement with normal pulmonary vascularity.  Increased density along the right mediastinal border could represent atelectasis, consolidation, or soft tissue attenuation.  Calcified and tortuous aorta.  Blunting of the costophrenic angles suggest small effusions.  IMPRESSION: Technically limited study.  Cardiac enlargement with suggestion of bilateral pleural effusions.  A soft tissue density versus atelectasis / consolidation in the right medial lung.  Original Report Authenticated By:  Marlon Pel, M.D.   Dg Chest Portable 1 View  05/19/2011  *RADIOLOGY REPORT*  Clinical Data: Short of breath  PORTABLE CHEST - 1 VIEW  Comparison: 01/16/2008  Findings: Suboptimal study due to rotation and underpenetration. The patient is large in size.  Cardiac enlargement without heart failure.  No definite pneumonia.  IMPRESSION: Limited study.  Followup or repeat study is suggested.  No definite acute abnormality.  Original Report Authenticated By: Camelia Phenes, M.D.    Brief H and P: 78 y.o. female who was [facility] to [facility] ED due to worsening SOB and increased lethargy. Patient has had worsening SOB over the past week along with Dyspnea on exertion. She was placed on treatments by her PCP of Omnicef anitibiotic therapy and a prednisone taper along with inhalers for asthmatic bronchitis but her symptoms continued to worsen. She reports not being able to lay flat or sleep because she can not breathe. She denies having Chest pain. She does report having a cough, abut she can not cough out any sputum. She denies having fevers or chills. She reports having orthopnea for months. She also reports in the past being sent for a sleep study to evaluate for sleep apnea, but she was unable to sleep so the study was not able to be performed.  In the ED her laboratory studies revealed a Pro-BNP of 13,423, her chest X-rays, however was inconclusive due to her positioning. She reports having CAD and had an MI with stent placement 10 years ago, but she denies having a diagnosis of CHF and also denies having Sleep apnea. Patient endorses also increase leg swelling lately.   Hospital Course:  1-Congestive heart failure (CHF): Diastolic in origin according to 2-D echo study performed at this admission. At this point patient has been instructed to follow a low-sodium/heart healthy diet. She is going to start using Lasix 20 mg by mouth daily and will follow with Dr. telemetry for further  evaluation, treatment and cardiac rehabilitation. Due to oxygen desaturation she will continue using oxygen through nasal cannula 2 L at home at rest and also on exertion. This is something that can be reevaluated after the next 2-3 weeks as an outpatient and discontinue if she doesn't require anymore. Patient was also started on imdur for better control of her blood pressure and to help with her CHF.  2-Hypertension: Stable and better control; will continue treatment with Lasix Lopressor and Imdur. Patient has been advised to follow a low-sodium diet.  3-acute  resp failure with Hypoxemia and hypercapnia: Acute component and distress resolved by the time of discharge and stable with [place] oxygen. She demonstrated desaturation on RA at rest and exertion; she will discharge with oxygen 2L at all time 24/7  4-CAD (coronary artery disease): continue current therapy and heart healthy diet; no elevation of her cardiac enzymes or telemetry abnormalities seen.  5-Hyperlipidemia: continue statins  6-UTI (urinary tract infection): acute tx to be completed with 2 more days of ciprofloxacin and then she will be back on cfdenir for prophylaxis.  7-Deconditioning: HHPT and HHOT has been arranged; she will also follow with cardiology for cardiac rehab.   Time spent on Discharge: 40 minutes  Signed: Emiel Kielty 05/24/2011, 10:29 AM

## 2011-05-24 NOTE — Progress Notes (Signed)
PT DC'D TO HOME WITH O2 FROM [facility]. Laura [name] [phone] OR [phone] 05/24/2011

## 2011-05-24 NOTE — Clinical Documentation Improvement (Signed)
GENERIC DOCUMENTATION CLARIFICATION QUERY  THIS DOCUMENT IS NOT A PERMANENT PART OF THE MEDICAL RECORD  TO RESPOND TO THE THIS QUERY, FOLLOW THE INSTRUCTIONS BELOW:  1. If needed, update documentation for the patient's encounter via the notes activity.  2. Access this query again and click edit on the In [employer].  3. After updating, or not, click F2 to complete all highlighted (required) fields concerning your review. Select "additional documentation in the medical record" OR "no additional documentation provided".  4. Click Sign note button.  5. The deficiency will fall out of your In Basket *Please let us know if you are not able to complete this workflow by phone or e-mail (listed below).  Please update your documentation within the medical record to reflect your response to this query.                                                                                        05/24/11   Dear Dr. Lestine Box [facility] Associates,  In a better effort to capture your patient's severity of illness, reflect appropriate length of stay and utilization of resources, a review of the patient medical record has revealed the following indicators.    Based on your clinical judgment, please clarify and document in a progress note and/or discharge summary the clinical condition associated with the following supporting information:  In responding to this query please exercise your independent judgment.  The fact that a query is asked, does not imply that any particular answer is desired or expected.  On 2/10 and 2/11 covering md documented "Hypercapnic resp failure"  was placed on bipap in ed ..but there is no mention of it in the discharge summary.  Please clarify if this diagnosis was appropriate or resolved during course of hospital stay. Thank you   Possible Clinical Conditions?  Hypercapnic Resp failure resolved  Hypoxic and Hypercapnic Resp failure resolved  Acute respiratory failure resolved    _______Other Condition__________________  _______Cannot Clinically Determine   Supporting Information:  Risk Factors: h/o MI, bronchitis, new dx of CHF  Signs & Symptoms: presented with worsening SOB, increased lethargy  Diagnostics: ABG ABG: 7.32/ 65 /69 /33/ 92 % ( 2/10)   Treatment: bipap then nasal cannula, nebs  You may use possible, probable, or suspect with inpatient documentation. possible, probable, suspected diagnoses MUST be documented at the time of discharge  Reviewed: Acute resp failure with hypoxemia and hypercapnia is appropriate for this patient and has been included to discharge summary.   Thank You,  Leonette Most Addison  Clinical Documentation Specialist RN, BSN:  Pager [phone] Health Information Management [place]

## 2011-05-24 NOTE — Discharge Instructions (Signed)
Heart Failure Heart failure (HF) is a condition in which the heart has trouble pumping blood. This means your heart does not pump blood efficiently for your body to work well. In some cases of HF, fluid may back up into your lungs or you may have swelling (edema) in your lower legs. HF is a long-term (chronic) condition. It is important for you to take good care of yourself and follow your caregiver's treatment plan. CAUSES   Health conditions:   High blood pressure (hypertension) causes the heart muscle to work harder than normal. When pressure in the blood vessels is high, the heart needs to pump (contract) with more force in order to circulate blood throughout the body. High blood pressure eventually causes the heart to become stiff and weak.   Coronary artery disease (CAD) is the buildup of cholesterol and fat (plaques) in the arteries of the heart. The blockage in the arteries deprives the heart muscle of oxygen and blood. This can cause chest pain and may lead to a heart attack. High blood pressure can also contribute to CAD.   Heart attack (myocardial infarction) occurs when 1 or more arteries in the heart become blocked. The loss of oxygen damages the muscle tissue of the heart. When this happens, part of the heart muscle dies. The injured tissue does not contract as well and weakens the heart's ability to pump blood.   Abnormal heart valves can cause HF when the heart valves do not open and close properly. This makes the heart muscle pump harder to keep the blood flowing.   Heart muscle disease (cardiomyopathy or myocarditis) is damage to the heart muscle from a variety of causes. These can include drug or alcohol abuse, infections, or unknown reasons. These can increase the risk of HF.   Lung disease makes the heart work harder because the lungs do not work properly. This can cause a strain on the heart leading it to fail.   Diabetes increases the risk of HF. High blood sugar contributes  to high fat (lipid) levels in the blood. Diabetes can also cause slow damage to tiny blood vessels that carry important nutrients to the heart muscle. When the heart does not get enough oxygen and food, it can cause the heart to become weak and stiff. This leads to a heart that does not contract efficiently.   Other diseases can contribute to HF. These include abnormal heart rhythms, thyroid problems, and low blood counts (anemia).   Unhealthy lifestyle habits:   Obesity.   Smoking.   Eating foods high in fat and cholesterol.   Eating or drinking beverages high in salt.   Drug or alcohol abuse.   Lack of exercise.  SYMPTOMS  HF symptoms may vary and can be hard to detect. Symptoms may include:  Shortness of breath with activity, such as climbing stairs.   Persistent cough.   Swelling of the feet, ankles, legs, or abdomen.   Unexplained weight gain.   Difficulty breathing when lying flat.   Waking from sleep because of the need to sit up and get more air.   Rapid heartbeat.   Fatigue and loss of energy.   Feeling lightheaded or close to fainting.  DIAGNOSIS  A diagnosis of HF is based on your history, symptoms, physical examination, and diagnostic tests. Diagnostic tests for HF may include:  EKG.   Chest X-ray.   Blood tests.   Exercise stress test.   Blood oxygen test (arterial blood gas).   Evaluation   by a heart doctor (cardiologist).   Ultrasound evaluation of the heart (echocardiogram).   Heart artery test to look for blockages (angiogram).   Radioactive imaging to look at the heart (radionuclide test).  TREATMENT  Treatment is aimed at managing the symptoms of HF. Medicines, lifestyle changes, or surgical intervention may be necessary to treat HF.  Medicines to help treat HF may include:   Angiotensin-converting enzyme (ACE) inhibitors. These block the effects of a blood protein called angiotensin-converting enzyme. ACE inhibitors relax (dilate) the  blood vessels and help lower blood pressure. This decreases the workload of the heart, slows the progression of HF, and improves symptoms.   Angiotensin receptor blockers (ARBs). These medications work similar to ACE inhibitors. ARBs may be an alternative for people who cannot tolerate an ACE inhibitor.   Aldosterone antagonists. This medication helps get rid of extra fluid from your body. This lowers the volume of blood the heart has to pump.   Water pills (diuretics). Diuretics cause the kidneys to remove salt and water from the blood. The extra fluid is removed by urination. By removing extra fluid from the body, diuretics help lower the workload of the heart and help prevent fluid buildup in the lungs so breathing is easier.   Beta blockers. These prevent the heart from beating too fast and improve heart muscle strength. Beta blockers help maintain a normal heart rate, control blood pressure, and improve HF symptoms.   Digitalis. This increases the force of the heartbeat and may be helpful to people with HF or heart rhythm problems.   Healthy lifestyle changes include:   Stopping smoking.   Eating a healthy diet. Avoid foods high in fat. Avoid foods fried in oil or made with fat. A dietician can help with healthy food choices.   Limiting how much salt you eat.   Limiting alcohol intake to no more than 1 drink per day for women and 2 drinks per day for men. Drinking more than that is harmful to your heart. If your heart has already been damaged by alcohol or you have severe HF, drinking alcohol should be stopped completely.   Exercising as directed by your caregiver.   Surgical treatment for HF may include:   Procedures to open blocked arteries, repair damaged heart valves, or remove damaged heart muscle tissue.   A pacemaker to help heart muscle function and to control certain abnormal heart rhythms.   A defibrillator to possibly prevent sudden cardiac death.  HOME CARE  INSTRUCTIONS   Activity level. Your caregiver can help you determine what type of exercise program may be helpful. It is important to maintain your strength. Pace your physical activity to avoid shortness of breath or chest pain. Rest for 1 hour before and after meals. A cardiac rehabilitation program may be helpful to some people with HF.   Diet. Eat a heart healthy diet. Food choices should be low in saturated fat and cholesterol. Talk to a dietician to learn about heart healthy foods.   Salt intake. When you have HF, you need to limit the amount of salt you eat. Eat less than 1500 milligrams (mg) of salt per day or as recommended by your caregiver.   Weight monitoring. Weigh yourself every day. You should weigh yourself in the morning after you urinate and before you eat breakfast. Wear the same amount of clothing each time you weigh yourself. Record your weight daily. Bring your recorded weights to your clinic visits. Tell your caregiver right away if   you have gained 3 lb/1.4 kg in 1 day, or 5 lb/2.3 kg in a week or whatever amount you were told to report.   Blood pressure monitoring. This should be done as directed by your caregiver. A home blood pressure cuff can be purchased at a drugstore. Record your blood pressure numbers and bring them to your clinic visits. Tell your caregiver if you become dizzy or lightheaded upon standing up.   Smoking. If you are currently a smoker, it is time to quit. Nicotine makes your heart work harder by causing your blood vessels to constrict. Do not use nicotine gum or patches before talking to your caregiver.   Follow up. Be sure to schedule a follow-up visit with your caregiver. Keep all your appointments.  SEEK MEDICAL CARE IF:   Your weight increases by 3 lb/1.4 kg in 1 day or 5 lb/2.3 kg in a week.   You notice increasing shortness of breath that is unusual for you. This may happen during rest, sleep, or with activity.   You cough more than normal,  especially with physical activity.   You notice more swelling in your hands, feet, ankles, or belly (abdomen).   You are unable to sleep because it is hard to breathe.   You cough up bloody mucus (sputum).   You begin to feel "jumping" or "fluttering" sensations (palpitations) in your chest.  SEEK IMMEDIATE MEDICAL CARE IF:   You have severe chest pain or pressure which may include symptoms such as:   Pain or pressure in the arms, neck, jaw, or back.   Feeling sweaty.   Feeling sick to your stomach (nauseous).   Feeling short of breath while at rest.   Having a fast or irregular heartbeat.   You experience stroke symptoms. These symptoms include:   Facial weakness or numbness.   Weakness or numbness in an arm, leg, or on one side of your body.   Blurred vision.   Difficulty talking or thinking.   Dizziness or fainting.   Severe headache.  THESE ARE MEDICAL EMERGENCIES. Do not wait to see if the symptoms go away. Call your local emergency services (911 in [place] DO NOT drive yourself to the hospital. IMPORTANT  Make a list of every medicine, vitamin, or herbal supplement you are taking. Keep the list with you at all times. Show it to your caregiver at every visit. Keep the list up-to-date.   Ask your caregiver or pharmacist to write an explanation of each medicine you are taking. This should include:   Why you are taking it.   The possible side effects.   The best time of day to take it.   Foods to take with it or what foods to avoid.   When to stop taking it.  MAKE SURE YOU:   Understand these instructions.   Will watch your condition.   Will get help right away if you are not doing well or get worse.  Document Released: 03/26/2005 Document Revised: 12/06/2010 Document Reviewed: 07/08/2009 [employer] Patient Information 2012 [employer].Heart Failure Heart failure (HF) is a condition in which the heart has trouble pumping blood. This means your heart  does not pump blood efficiently for your body to work well. In some cases of HF, fluid may back up into your lungs or you may have swelling (edema) in your lower legs. HF is a long-term (chronic) condition. It is important for you to take good care of yourself and follow your caregiver's treatment plan. CAUSES     Health conditions:   High blood pressure (hypertension) causes the heart muscle to work harder than normal. When pressure in the blood vessels is high, the heart needs to pump (contract) with more force in order to circulate blood throughout the body. High blood pressure eventually causes the heart to become stiff and weak.   Coronary artery disease (CAD) is the buildup of cholesterol and fat (plaques) in the arteries of the heart. The blockage in the arteries deprives the heart muscle of oxygen and blood. This can cause chest pain and may lead to a heart attack. High blood pressure can also contribute to CAD.   Heart attack (myocardial infarction) occurs when 1 or more arteries in the heart become blocked. The loss of oxygen damages the muscle tissue of the heart. When this happens, part of the heart muscle dies. The injured tissue does not contract as well and weakens the heart's ability to pump blood.   Abnormal heart valves can cause HF when the heart valves do not open and close properly. This makes the heart muscle pump harder to keep the blood flowing.   Heart muscle disease (cardiomyopathy or myocarditis) is damage to the heart muscle from a variety of causes. These can include drug or alcohol abuse, infections, or unknown reasons. These can increase the risk of HF.   Lung disease makes the heart work harder because the lungs do not work properly. This can cause a strain on the heart leading it to fail.   Diabetes increases the risk of HF. High blood sugar contributes to high fat (lipid) levels in the blood. Diabetes can also cause slow damage to tiny blood vessels that carry  important nutrients to the heart muscle. When the heart does not get enough oxygen and food, it can cause the heart to become weak and stiff. This leads to a heart that does not contract efficiently.   Other diseases can contribute to HF. These include abnormal heart rhythms, thyroid problems, and low blood counts (anemia).   Unhealthy lifestyle habits:   Obesity.   Smoking.   Eating foods high in fat and cholesterol.   Eating or drinking beverages high in salt.   Drug or alcohol abuse.   Lack of exercise.  SYMPTOMS  HF symptoms may vary and can be hard to detect. Symptoms may include:  Shortness of breath with activity, such as climbing stairs.   Persistent cough.   Swelling of the feet, ankles, legs, or abdomen.   Unexplained weight gain.   Difficulty breathing when lying flat.   Waking from sleep because of the need to sit up and get more air.   Rapid heartbeat.   Fatigue and loss of energy.   Feeling lightheaded or close to fainting.  DIAGNOSIS  A diagnosis of HF is based on your history, symptoms, physical examination, and diagnostic tests. Diagnostic tests for HF may include:  EKG.   Chest X-ray.   Blood tests.   Exercise stress test.   Blood oxygen test (arterial blood gas).   Evaluation by a heart doctor (cardiologist).   Ultrasound evaluation of the heart (echocardiogram).   Heart artery test to look for blockages (angiogram).   Radioactive imaging to look at the heart (radionuclide test).  TREATMENT  Treatment is aimed at managing the symptoms of HF. Medicines, lifestyle changes, or surgical intervention may be necessary to treat HF.  Medicines to help treat HF may include:   Angiotensin-converting enzyme (ACE) inhibitors. These block the effects of a blood   protein called angiotensin-converting enzyme. ACE inhibitors relax (dilate) the blood vessels and help lower blood pressure. This decreases the workload of the heart, slows the progression  of HF, and improves symptoms.   Angiotensin receptor blockers (ARBs). These medications work similar to ACE inhibitors. ARBs may be an alternative for people who cannot tolerate an ACE inhibitor.   Aldosterone antagonists. This medication helps get rid of extra fluid from your body. This lowers the volume of blood the heart has to pump.   Water pills (diuretics). Diuretics cause the kidneys to remove salt and water from the blood. The extra fluid is removed by urination. By removing extra fluid from the body, diuretics help lower the workload of the heart and help prevent fluid buildup in the lungs so breathing is easier.   Beta blockers. These prevent the heart from beating too fast and improve heart muscle strength. Beta blockers help maintain a normal heart rate, control blood pressure, and improve HF symptoms.   Digitalis. This increases the force of the heartbeat and may be helpful to people with HF or heart rhythm problems.   Healthy lifestyle changes include:   Stopping smoking.   Eating a healthy diet. Avoid foods high in fat. Avoid foods fried in oil or made with fat. A dietician can help with healthy food choices.   Limiting how much salt you eat.   Limiting alcohol intake to no more than 1 drink per day for women and 2 drinks per day for men. Drinking more than that is harmful to your heart. If your heart has already been damaged by alcohol or you have severe HF, drinking alcohol should be stopped completely.   Exercising as directed by your caregiver.   Surgical treatment for HF may include:   Procedures to open blocked arteries, repair damaged heart valves, or remove damaged heart muscle tissue.   A pacemaker to help heart muscle function and to control certain abnormal heart rhythms.   A defibrillator to possibly prevent sudden cardiac death.  HOME CARE INSTRUCTIONS   Activity level. Your caregiver can help you determine what type of exercise program may be helpful. It  is important to maintain your strength. Pace your physical activity to avoid shortness of breath or chest pain. Rest for 1 hour before and after meals. A cardiac rehabilitation program may be helpful to some people with HF.   Diet. Eat a heart healthy diet. Food choices should be low in saturated fat and cholesterol. Talk to a dietician to learn about heart healthy foods.   Salt intake. When you have HF, you need to limit the amount of salt you eat. Eat less than 1500 milligrams (mg) of salt per day or as recommended by your caregiver.   Weight monitoring. Weigh yourself every day. You should weigh yourself in the morning after you urinate and before you eat breakfast. Wear the same amount of clothing each time you weigh yourself. Record your weight daily. Bring your recorded weights to your clinic visits. Tell your caregiver right away if you have gained 3 lb/1.4 kg in 1 day, or 5 lb/2.3 kg in a week or whatever amount you were told to report.   Blood pressure monitoring. This should be done as directed by your caregiver. A home blood pressure cuff can be purchased at a drugstore. Record your blood pressure numbers and bring them to your clinic visits. Tell your caregiver if you become dizzy or lightheaded upon standing up.   Smoking. If you are   currently a smoker, it is time to quit. Nicotine makes your heart work harder by causing your blood vessels to constrict. Do not use nicotine gum or patches before talking to your caregiver.   Follow up. Be sure to schedule a follow-up visit with your caregiver. Keep all your appointments.  SEEK MEDICAL CARE IF:   Your weight increases by 3 lb/1.4 kg in 1 day or 5 lb/2.3 kg in a week.   You notice increasing shortness of breath that is unusual for you. This may happen during rest, sleep, or with activity.   You cough more than normal, especially with physical activity.   You notice more swelling in your hands, feet, ankles, or belly (abdomen).   You  are unable to sleep because it is hard to breathe.   You cough up bloody mucus (sputum).   You begin to feel "jumping" or "fluttering" sensations (palpitations) in your chest.  SEEK IMMEDIATE MEDICAL CARE IF:   You have severe chest pain or pressure which may include symptoms such as:   Pain or pressure in the arms, neck, jaw, or back.   Feeling sweaty.   Feeling sick to your stomach (nauseous).   Feeling short of breath while at rest.   Having a fast or irregular heartbeat.   You experience stroke symptoms. These symptoms include:   Facial weakness or numbness.   Weakness or numbness in an arm, leg, or on one side of your body.   Blurred vision.   Difficulty talking or thinking.   Dizziness or fainting.   Severe headache.  THESE ARE MEDICAL EMERGENCIES. Do not wait to see if the symptoms go away. Call your local emergency services (911 in [place] DO NOT drive yourself to the hospital. IMPORTANT  Make a list of every medicine, vitamin, or herbal supplement you are taking. Keep the list with you at all times. Show it to your caregiver at every visit. Keep the list up-to-date.   Ask your caregiver or pharmacist to write an explanation of each medicine you are taking. This should include:   Why you are taking it.   The possible side effects.   The best time of day to take it.   Foods to take with it or what foods to avoid.   When to stop taking it.  MAKE SURE YOU:   Understand these instructions.   Will watch your condition.   Will get help right away if you are not doing well or get worse.  Document Released: 03/26/2005 Document Revised: 12/06/2010 Document Reviewed: 07/08/2009 [employer] Patient Information 2012 [employer]. 

## 2011-05-24 NOTE — Progress Notes (Signed)
Discussed pt's case extensively with daughter, trying to help her understand how pt was doing.  Relating that all we as therapists had witnessed showed that she was in fact ready for D/C.  Facilitated discussion between the RN and daughter, but told daughter it was ultimately up to the MD and if she was medically ready.  Was not able to work directly with the pt today.  05/24/2011  [place] Bing, PT [phone] (pager)

## 2011-05-24 NOTE — Progress Notes (Signed)
Nursing: O2 sats on RA while at rest is 84%. Patient did not need to ambulate hallway.

## 2011-05-24 NOTE — Progress Notes (Signed)
D/C NSL and telemetry. Went over HF book and D/C. Pt aware of follow up appts, meds current and which ones to discontinue. Stressed daily weights and weight chart using teachback method. Instructions given to husband and pt. Pt has all belongings. Staff assisting her with dressing prior to discharge Fresh peripad and mesh panties given per pt request.Prescriptions given. Marisa Cyphers RN

## 2011-05-24 NOTE — Progress Notes (Signed)
Occupational Therapy Treatment Patient Details Name: Laura [name] MRN: [id number] DOB: 02/04/1930 Today's Date: 05/24/2011  OT Assessment/Plan OT Assessment/Plan Comments on Treatment Session: Pt. educated on energy conservation with ADLs and techniques to increase safety at D/C home. Pt. moving well and energy conservation is improving. OT Plan: Discharge plan remains appropriate OT Frequency: Min 2X/week Follow Up Recommendations: No OT follow up;Other (comment) Equipment Recommended: None recommended by OT OT Goals Acute Rehab OT Goals OT Goal Formulation: With patient/family Time For Goal Achievement: 7 days ADL Goals Pt Will Perform Grooming: with modified independence;Standing at sink ADL Goal: Grooming - Progress: Progressing toward goals Pt Will Perform Lower Body Dressing: with modified independence;Sit to stand from chair;Other (comment) ADL Goal: Lower Body Dressing - Progress: Progressing toward goals Pt Will Perform Tub/Shower Transfer: with modified independence;Grab bars;Ambulation ADL Goal: Tub/Shower Transfer - Progress: Progressing toward goals Additional ADL Goal #1: Pt will shower standing using grab rails with mod I. ADL Goal: Additional Goal #1 - Progress: Progressing toward goals Additional ADL Goal #2: Pt will complete all aspects of toileting with 3:1 over commode with mod I. ADL Goal: Additional Goal #2 - Progress: Progressing toward goals Miscellaneous OT Goals Miscellaneous OT Goal #1: Pt will state 4 things she can do differently at home to conserve energy after d/c. OT Goal: Miscellaneous Goal #1 - Progress: Met  OT Treatment Precautions/Restrictions  Precautions Precautions: Fall Restrictions Weight Bearing Restrictions: No   ADL ADL Lower Body Bathing: Simulated;Supervision/safety Lower Body Bathing Details (indicate cue type and reason): Pt. educated on use of long handled sponge to conserve energy Where Assessed - Lower Body Bathing: Sit  to stand from chair Upper Body Dressing: Performed;Set up Upper Body Dressing Details (indicate cue type and reason): With donning gown Where Assessed - Upper Body Dressing: Sitting, chair Lower Body Dressing: Performed;Supervision/safety Lower Body Dressing Details (indicate cue type and reason): Educated pt. on use of reacher for donning undergarments and pants to conserve energy Where Assessed - Lower Body Dressing: Sit to stand from chair ADL Comments: pt. educated on breathing techniques during ADLs to conserve energy due to pt. with SOB during activity. Pt. provided with handout on energy conservation techniques regarding IADLs and ADLs to increase safety with ADLs. Mobility  Transfers Sit to Stand: 5: Supervision     End of Session OT - End of Session Activity Tolerance: Patient tolerated treatment well Patient left: in chair;with call bell in reach;with family/visitor present Nurse Communication: Mobility status for transfers General Behavior During Session: [facility] for tasks performed Cognition: [facility] for tasks performed  Elesha Thedford, OTR/L Pager [phone]  05/24/2011, 2:47 PM

## 2012-02-18 ENCOUNTER — Encounter: Payer: Self-pay | Admitting: Vascular Surgery

## 2012-02-19 ENCOUNTER — Ambulatory Visit (INDEPENDENT_AMBULATORY_CARE_PROVIDER_SITE_OTHER): Payer: Medicare Other | Admitting: Vascular Surgery

## 2012-02-19 ENCOUNTER — Encounter: Payer: Self-pay | Admitting: Vascular Surgery

## 2012-02-19 VITALS — BP 147/79 | HR 65 | Resp 20 | Ht 64.0 in | Wt 205.0 lb

## 2012-02-19 DIAGNOSIS — I714 Abdominal aortic aneurysm, without rupture, unspecified: Secondary | ICD-10-CM | POA: Insufficient documentation

## 2012-02-19 DIAGNOSIS — IMO0001 Reserved for inherently not codable concepts without codable children: Secondary | ICD-10-CM

## 2012-02-19 DIAGNOSIS — I712 Thoracic aortic aneurysm, without rupture: Secondary | ICD-10-CM

## 2012-02-19 NOTE — Progress Notes (Signed)
[facility]   Patient name: Laura [name] MRN: [id number] DOB: 12-Feb-1930 Sex: female   Referred by: Laverle Patter  Reason for referral:  Chief Complaint  Patient presents with  . AAA    NEW EVALUATION    HISTORY OF PRESENT ILLNESS: Patient is a 78 year old female who recently underwent a CT scan of her abdomen and pelvis for evaluation of urge incontinence. She had an incidental finding of a descending thoracic aneurysm was 4.3 cm. She is seeing me for further discussion of this. She had no known history of aneurysm and has no history of abdominal aortic aneurysm and no family history of aneurysm. She has no symptoms related to this. He does have significant prior cardiac history also does have a severe shortness of breath with exertion and does have a prior history of bilateral knee replacements.  Past Medical History  Diagnosis Date  . Coronary artery disease   . Myocardial infarction   . Hypertension   . Arthritis   . Asthma   . Chronic bronchitis     Past Surgical History  Procedure Date  . Coronary stent placement   . Appendectomy   . Tonsillectomy   . Knee surgery     History   Social History  . Marital Status: Married    Spouse Name: N/A    Number of Children: N/A  . Years of Education: N/A   Occupational History  . Not on file.   Social History Main Topics  . Smoking status: Never Smoker   . Smokeless tobacco: Never Used  . Alcohol Use: No  . Drug Use: No  . Sexually Active: Not on file   Other Topics Concern  . Not on file   Social History Narrative  . No narrative on file    Family History  Problem Relation Age of Onset  . Adopted: Yes    Allergies as of 02/19/2012 - Review Complete 02/19/2012  Allergen Reaction Noted  . Codeine Nausea And Vomiting 05/19/2011  . Hydrocodone Other (See Comments) 05/19/2011    Current Outpatient Prescriptions on File Prior to Visit  Medication Sig Dispense Refill  . albuterol  (PROVENTIL HFA;VENTOLIN HFA) 108 (90 BASE) MCG/ACT inhaler Inhale 2 puffs into the lungs every 4 (four) hours as needed. For cough or wheezing      . aspirin 81 MG tablet Take 160 mg by mouth daily.      Marland Kitchen ezetimibe (ZETIA) 10 MG tablet Take 10 mg by mouth daily.      . furosemide (LASIX) 20 MG tablet Take 1 tablet (20 mg total) by mouth daily.  30 tablet  1  . guaiFENesin (MUCINEX) 600 MG 12 hr tablet Take 1 tablet (600 mg total) by mouth 2 (two) times daily.  30 tablet  0  . isosorbide mononitrate (IMDUR) 30 MG 24 hr tablet Take 1 tablet (30 mg total) by mouth daily.  30 tablet  1  . metoprolol tartrate (LOPRESSOR) 25 MG tablet Take 1 tablet (25 mg total) by mouth 2 (two) times daily.  60 tablet  1  . raloxifene (EVISTA) 60 MG tablet Take 60 mg by mouth daily.      . rosuvastatin (CRESTOR) 20 MG tablet Take 20 mg by mouth daily.      . Vitamin D, Ergocalciferol, (DRISDOL) 50000 UNITS CAPS Take 50,000 Units by mouth every 7 (seven) days.      . Wound Dressings (INTERDRY AG TEXTILE 10"X12') MISC Apply to skin folds to  help with healing; leave in place for 72 hours and then exchange; use until sking is healed. Keep area clean and dry.      . cefdinir (OMNICEF) 300 MG capsule Take 300 mg by mouth 2 (two) times daily.         REVIEW OF SYSTEMS:  Positives indicated with an "X"  CARDIOVASCULAR:  [ ]  chest pain   [ ]  chest pressure   [ ]  palpitations   [ ]  orthopnea   [x ] dyspnea on exertion   [ ]  claudication   [ ]  rest pain   [ ]  DVT   [ ]  phlebitis PULMONARY:   [ ]  productive cough   [x ] asthma   [ ]  wheezing NEUROLOGIC:   [ ]  weakness  [ ]  paresthesias  [ ]  aphasia  [ ]  amaurosis  [ ]  dizziness HEMATOLOGIC:   [ ]  bleeding problems   [ ]  clotting disorders MUSCULOSKELETAL:  [ ]  joint pain   [ ]  joint swelling GASTROINTESTINAL: [ ]   blood in stool  [ ]   hematemesis GENITOURINARY:  [ ]   dysuria  [ ]   hematuria PSYCHIATRIC:  [ ]  history of major depression INTEGUMENTARY:  [ ]  rashes  [ ]   ulcers CONSTITUTIONAL:  [ ]  fever   [ ]  chills  PHYSICAL EXAMINATION:  General: The patient is a well-nourished female, in no acute distress. Vital signs are BP 147/79  Pulse 65  Resp 20  Ht 5\' 4"  (1.626 m)  Wt 205 lb (92.987 kg)  BMI 35.19 kg/m2 Pulmonary: There is a good air exchange bilaterally without wheezing or rales. Abdomen: Soft and non-tender with normal pitch bowel sounds. No aneurysm palpable Musculoskeletal: There are no major deformities.  There is no significant extremity pain. Neurologic: No focal weakness or paresthesias are detected, Skin: There are no ulcer or rashes noted. Psychiatric: The patient has normal affect. Cardiovascular: There is a regular rate and rhythm without significant murmur appreciated. Carotid arteries without bruits bilaterally Pulse status: 2+ radial 2+ femoral and 2+ dorsalis pedis pulses bilaterally  CT [facility] at [facility]: 4.3 cm distal thoracic aorta.   Impression and Plan:  Asymptomatic 4.3 cm distal thoracic aneurysm. Long discussion with the patient and her family present explaining the significance of this. I explained we would recommend continued surveillance. I explained that her risk of rupture would be extremely small with a size aneurysm. It does appear that she would be a candidate for stent graft repair if she does have enlargement in size with its current location. I recommend that we see her again in 6 months with a CT scan of her chest for evaluation of her upper thoracic aorta and also for followup of her aneurysm. I did discuss symptoms of aneurysmal expansion rupture and she knows to report immediately to the emergency room should this occur    Elizabeth Haff [facility] [place] Office: [phone]

## 2012-02-20 NOTE — Addendum Note (Signed)
Addended by: Sharee Pimple on: 02/20/2012 08:31 AM   Modules accepted: Orders

## 2012-08-18 ENCOUNTER — Encounter: Payer: Self-pay | Admitting: Vascular Surgery

## 2012-08-19 ENCOUNTER — Ambulatory Visit: Payer: Medicare Other | Admitting: Vascular Surgery

## 2012-08-19 ENCOUNTER — Inpatient Hospital Stay: Admission: RE | Admit: 2012-08-19 | Payer: Medicare Other | Source: Ambulatory Visit

## 2012-09-09 ENCOUNTER — Ambulatory Visit
Admission: RE | Admit: 2012-09-09 | Discharge: 2012-09-09 | Disposition: A | Discharge status: Home / Self Care | Payer: Medicare Other | Source: Ambulatory Visit | Attending: Vascular Surgery | Admitting: Vascular Surgery

## 2012-09-09 DIAGNOSIS — IMO0001 Reserved for inherently not codable concepts without codable children: Secondary | ICD-10-CM

## 2012-09-09 MED ORDER — IOHEXOL 300 MG/ML  SOLN
75.0000 mL | Freq: Once | INTRAMUSCULAR | Status: AC | PRN
  Administered 2012-09-09: 75 mL via INTRAVENOUS

## 2012-09-16 ENCOUNTER — Other Ambulatory Visit: Payer: Medicare Other

## 2012-09-22 ENCOUNTER — Encounter: Payer: Self-pay | Admitting: Vascular Surgery

## 2012-09-23 ENCOUNTER — Ambulatory Visit: Payer: Medicare Other | Admitting: Vascular Surgery

## 2012-09-29 ENCOUNTER — Encounter: Payer: Self-pay | Admitting: Vascular Surgery

## 2012-09-30 ENCOUNTER — Encounter: Payer: Self-pay | Admitting: Vascular Surgery

## 2012-09-30 ENCOUNTER — Ambulatory Visit (INDEPENDENT_AMBULATORY_CARE_PROVIDER_SITE_OTHER): Payer: Medicare Other | Admitting: Vascular Surgery

## 2012-09-30 VITALS — BP 150/83 | HR 65 | Resp 18 | Ht 65.0 in | Wt 195.0 lb

## 2012-09-30 DIAGNOSIS — IMO0001 Reserved for inherently not codable concepts without codable children: Secondary | ICD-10-CM

## 2012-09-30 DIAGNOSIS — I712 Thoracic aortic aneurysm, without rupture: Secondary | ICD-10-CM

## 2012-09-30 NOTE — Progress Notes (Signed)
Patient presents today for followup CT scan of incidental finding of 4.3 cm descending thoracic aortic aneurysm. She reports that she has had occult he with pulmonary infection over the course of the spring and has had some improvement recently regarding this. She has no symptoms referable to her aneurysm.  Past Medical History  Diagnosis Date  . Coronary artery disease   . Myocardial infarction   . Hypertension   . Arthritis   . Asthma   . Chronic bronchitis     History  Substance Use Topics  . Smoking status: Never Smoker   . Smokeless tobacco: Never Used  . Alcohol Use: No    Family History  Problem Relation Age of Onset  . Adopted: Yes    Allergies  Allergen Reactions  . Codeine Nausea And Vomiting  . Hydrocodone Other (See Comments)    delusions    Current outpatient prescriptions:albuterol (PROVENTIL HFA;VENTOLIN HFA) 108 (90 BASE) MCG/ACT inhaler, Inhale 2 puffs into the lungs every 4 (four) hours as needed. For cough or wheezing, Disp: , Rfl: ;  aspirin 81 MG tablet, Take 160 mg by mouth daily., Disp: , Rfl: ;  cefdinir (OMNICEF) 300 MG capsule, Take 300 mg by mouth 2 (two) times daily., Disp: , Rfl: ;  COLCHICINE PO, Take by mouth., Disp: , Rfl:  dextromethorphan-guaiFENesin (MUCINEX DM) 30-600 MG per 12 hr tablet, Take 1 tablet by mouth every 12 (twelve) hours., Disp: , Rfl: ;  ezetimibe (ZETIA) 10 MG tablet, Take 10 mg by mouth daily., Disp: , Rfl: ;  Montelukast Sodium (SINGULAIR PO), Take by mouth daily., Disp: , Rfl: ;  raloxifene (EVISTA) 60 MG tablet, Take 60 mg by mouth daily., Disp: , Rfl: ;  rosuvastatin (CRESTOR) 20 MG tablet, Take 20 mg by mouth daily., Disp: , Rfl:  solifenacin (VESICARE) 10 MG tablet, Take by mouth daily., Disp: , Rfl: ;  Vitamin D, Ergocalciferol, (DRISDOL) 50000 UNITS CAPS, Take 50,000 Units by mouth every 7 (seven) days., Disp: , Rfl: ;  furosemide (LASIX) 20 MG tablet, Take 1 tablet (20 mg total) by mouth daily., Disp: 30 tablet, Rfl: 1;   isosorbide mononitrate (IMDUR) 30 MG 24 hr tablet, Take 1 tablet (30 mg total) by mouth daily., Disp: 30 tablet, Rfl: 1 metoprolol tartrate (LOPRESSOR) 25 MG tablet, Take 1 tablet (25 mg total) by mouth 2 (two) times daily., Disp: 60 tablet, Rfl: 1;  Wound Dressings (INTERDRY AG TEXTILE 10"X12') MISC, Apply to skin folds to help with healing; leave in place for 72 hours and then exchange; use until sking is healed. Keep area clean and dry., Disp: , Rfl:   BP 150/83  Pulse 65  Resp 18  Ht 5\' 5"  (1.651 m)  Wt 195 lb (88.451 kg)  BMI 32.45 kg/m2  Body mass index is 32.45 kg/(m^2).       Physical exam well-developed moderately obese female in no acute distress Heart regular rate and rhythm without murmur Chest with equal breath sounds bilaterally no wheezes Pulse status 2+ radial pulses Neurologically she is grossly intact  CT scan today reveals no evidence of change in the diameter of her descending thoracic aorta. Her heart she has no evidence of aneurysmal change. Maximal diameter is remains 4.3 cm  Impression and plan stable descending thoracic aortic aneurysm. I explained that she now has to images of the 6 months apart which showed no change suggesting no increased growth. I have recommended yearly CT scan for continued evaluation of this. I did review symptoms  of leaking aneurysm she does report immediately to the emergency room should this occur. Otherwise we'll see her in one year with repeat CT scan of her chest

## 2012-09-30 NOTE — Addendum Note (Signed)
Addended by: Adria Dill L on: 09/30/2012 04:55 PM   Modules accepted: Orders

## 2013-04-03 ENCOUNTER — Emergency Department (HOSPITAL_COMMUNITY): Payer: Medicare Other

## 2013-04-03 ENCOUNTER — Encounter (HOSPITAL_COMMUNITY): Payer: Self-pay | Admitting: Emergency Medicine

## 2013-04-03 ENCOUNTER — Inpatient Hospital Stay (HOSPITAL_COMMUNITY)
Admission: EM | Admit: 2013-04-03 | Discharge: 2013-04-07 | DRG: 690 | Disposition: A | Discharge status: Skilled Nursing Facility | Payer: Medicare Other | Attending: Internal Medicine | Admitting: Internal Medicine

## 2013-04-03 DIAGNOSIS — Z885 Allergy status to narcotic agent status: Secondary | ICD-10-CM

## 2013-04-03 DIAGNOSIS — R0689 Other abnormalities of breathing: Secondary | ICD-10-CM

## 2013-04-03 DIAGNOSIS — M129 Arthropathy, unspecified: Secondary | ICD-10-CM | POA: Diagnosis present

## 2013-04-03 DIAGNOSIS — R0602 Shortness of breath: Secondary | ICD-10-CM

## 2013-04-03 DIAGNOSIS — N179 Acute kidney failure, unspecified: Secondary | ICD-10-CM

## 2013-04-03 DIAGNOSIS — I10 Essential (primary) hypertension: Secondary | ICD-10-CM

## 2013-04-03 DIAGNOSIS — N390 Urinary tract infection, site not specified: Secondary | ICD-10-CM

## 2013-04-03 DIAGNOSIS — Z9861 Coronary angioplasty status: Secondary | ICD-10-CM

## 2013-04-03 DIAGNOSIS — R059 Cough, unspecified: Secondary | ICD-10-CM | POA: Diagnosis present

## 2013-04-03 DIAGNOSIS — A498 Other bacterial infections of unspecified site: Secondary | ICD-10-CM | POA: Diagnosis present

## 2013-04-03 DIAGNOSIS — N12 Tubulo-interstitial nephritis, not specified as acute or chronic: Principal | ICD-10-CM

## 2013-04-03 DIAGNOSIS — I509 Heart failure, unspecified: Secondary | ICD-10-CM

## 2013-04-03 DIAGNOSIS — Z23 Encounter for immunization: Secondary | ICD-10-CM

## 2013-04-03 DIAGNOSIS — R05 Cough: Secondary | ICD-10-CM | POA: Diagnosis present

## 2013-04-03 DIAGNOSIS — IMO0001 Reserved for inherently not codable concepts without codable children: Secondary | ICD-10-CM

## 2013-04-03 DIAGNOSIS — E785 Hyperlipidemia, unspecified: Secondary | ICD-10-CM

## 2013-04-03 DIAGNOSIS — J45909 Unspecified asthma, uncomplicated: Secondary | ICD-10-CM | POA: Diagnosis present

## 2013-04-03 DIAGNOSIS — J111 Influenza due to unidentified influenza virus with other respiratory manifestations: Secondary | ICD-10-CM

## 2013-04-03 DIAGNOSIS — J42 Unspecified chronic bronchitis: Secondary | ICD-10-CM | POA: Diagnosis present

## 2013-04-03 DIAGNOSIS — I252 Old myocardial infarction: Secondary | ICD-10-CM

## 2013-04-03 DIAGNOSIS — R509 Fever, unspecified: Secondary | ICD-10-CM | POA: Diagnosis present

## 2013-04-03 DIAGNOSIS — R0902 Hypoxemia: Secondary | ICD-10-CM

## 2013-04-03 DIAGNOSIS — I2510 Atherosclerotic heart disease of native coronary artery without angina pectoris: Secondary | ICD-10-CM

## 2013-04-03 DIAGNOSIS — Z9981 Dependence on supplemental oxygen: Secondary | ICD-10-CM

## 2013-04-03 DIAGNOSIS — N1330 Unspecified hydronephrosis: Secondary | ICD-10-CM | POA: Diagnosis present

## 2013-04-03 DIAGNOSIS — N200 Calculus of kidney: Secondary | ICD-10-CM

## 2013-04-03 DIAGNOSIS — R52 Pain, unspecified: Secondary | ICD-10-CM | POA: Diagnosis present

## 2013-04-03 DIAGNOSIS — Z7982 Long term (current) use of aspirin: Secondary | ICD-10-CM

## 2013-04-03 DIAGNOSIS — Z79899 Other long term (current) drug therapy: Secondary | ICD-10-CM

## 2013-04-03 DIAGNOSIS — N201 Calculus of ureter: Secondary | ICD-10-CM | POA: Diagnosis present

## 2013-04-03 LAB — URINALYSIS, ROUTINE W REFLEX MICROSCOPIC
Ketones, ur: NEGATIVE mg/dL
Nitrite: NEGATIVE
Protein, ur: 30 mg/dL — AB
Urobilinogen, UA: 4 mg/dL — ABNORMAL HIGH (ref 0.0–1.0)
pH: 5 (ref 5.0–8.0)

## 2013-04-03 LAB — POCT I-STAT, CHEM 8
HCT: 39 % (ref 36.0–46.0)
Hemoglobin: 13.3 g/dL (ref 12.0–15.0)
Sodium: 134 mEq/L — ABNORMAL LOW (ref 135–145)
TCO2: 25 mmol/L (ref 0–100)

## 2013-04-03 LAB — CBC WITH DIFFERENTIAL/PLATELET
Basophils Relative: 0 % (ref 0–1)
Eosinophils Absolute: 0 10*3/uL (ref 0.0–0.7)
Hemoglobin: 11.5 g/dL — ABNORMAL LOW (ref 12.0–15.0)
Lymphocytes Relative: 2 % — ABNORMAL LOW (ref 12–46)
MCHC: 31.7 g/dL (ref 30.0–36.0)
Neutrophils Relative %: 92 % — ABNORMAL HIGH (ref 43–77)
RBC: 4.48 MIL/uL (ref 3.87–5.11)
WBC Morphology: INCREASED

## 2013-04-03 LAB — BASIC METABOLIC PANEL
Calcium: 9.3 mg/dL (ref 8.4–10.5)
Chloride: 93 mEq/L — ABNORMAL LOW (ref 96–112)
Creatinine, Ser: 2.41 mg/dL — ABNORMAL HIGH (ref 0.50–1.10)
GFR calc Af Amer: 21 mL/min — ABNORMAL LOW (ref >90)
GFR calc non Af Amer: 18 mL/min — ABNORMAL LOW (ref >90)

## 2013-04-03 LAB — URINE MICROSCOPIC-ADD ON

## 2013-04-03 MED ORDER — DEXTROSE 5 % IV SOLN
1.0000 g | Freq: Once | INTRAVENOUS | Status: AC
  Administered 2013-04-03: 1 g via INTRAVENOUS
  Filled 2013-04-03: qty 10

## 2013-04-03 MED ORDER — IOHEXOL 300 MG/ML  SOLN
50.0000 mL | Freq: Once | INTRAMUSCULAR | Status: AC | PRN
  Administered 2013-04-03: 50 mL via ORAL

## 2013-04-03 MED ORDER — POTASSIUM CHLORIDE CRYS ER 20 MEQ PO TBCR
40.0000 meq | EXTENDED_RELEASE_TABLET | Freq: Once | ORAL | Status: AC
  Administered 2013-04-03: 40 meq via ORAL
  Filled 2013-04-03: qty 2

## 2013-04-03 NOTE — ED Notes (Addendum)
Per EMS pt has had increased weakness and generalized body aches since Monday. Pt reports cough with white sputum.

## 2013-04-03 NOTE — ED Notes (Signed)
Bed: MW10 Expected date:  Expected time:  Means of arrival:  Comments: EMS, weakness

## 2013-04-03 NOTE — ED Provider Notes (Signed)
CSN: [id number]     Arrival date & time 04/03/13  1831 History   First MD Initiated Contact with Patient 04/03/13 2036     Chief Complaint  Patient presents with  . Generalized Body Aches   (Consider location/radiation/quality/duration/timing/severity/associated sxs/prior Treatment) HPI Comments: Patient is an 80 yo F PMHx significant for CAD, MI, HTN, Asthma, Bronchitis presenting to the ED for three days of cough, nausea, mylagias, and generalized fatigue. Patient states she called her PCP and was prescribed a anti-tussive medication which helped alleviate her cough, but caused some nausea and emesis. Tylenol has help alleviate the myalgias. No aggravating factors. Patient reports subjective chills, but no documented fever. Patient does endorse decreased appetite. No sick contacts at home.    Past Medical History  Diagnosis Date  . Coronary artery disease   . Myocardial infarction   . Hypertension   . Arthritis   . Asthma   . Chronic bronchitis    Past Surgical History  Procedure Laterality Date  . Coronary stent placement    . Appendectomy    . Tonsillectomy    . Knee surgery     Family History  Problem Relation Age of Onset  . Adopted: Yes   History  Substance Use Topics  . Smoking status: Never Smoker   . Smokeless tobacco: Never Used  . Alcohol Use: No   OB History   Grav Para Term Preterm Abortions TAB SAB Ect Mult Living                 Review of Systems  Constitutional: Positive for chills.  Respiratory: Positive for cough.   Gastrointestinal: Positive for nausea and vomiting. Negative for abdominal pain.  All other systems reviewed and are negative.    Allergies  Codeine and Hydrocodone  Home Medications   Current Outpatient Rx  Name  Route  Sig  Dispense  Refill  . albuterol (PROVENTIL HFA;VENTOLIN HFA) 108 (90 BASE) MCG/ACT inhaler   Inhalation   Inhale 2 puffs into the lungs every 4 (four) hours as needed. For cough or wheezing         .  aspirin 81 MG tablet   Oral   Take 160 mg by mouth daily.         . metoprolol succinate (TOPROL-XL) 100 MG 24 hr tablet   Oral   Take 100 mg by mouth daily. Take with or immediately following a meal.         . allopurinol (ZYLOPRIM) 300 MG tablet               . azithromycin (ZITHROMAX) 500 MG tablet               . BESIVANCE 0.6 % SUSP               . cefdinir (OMNICEF) 300 MG capsule   Oral   Take 300 mg by mouth 2 (two) times daily.         . COLCHICINE PO   Oral   Take by mouth.         . dextromethorphan-guaiFENesin (MUCINEX DM) 30-600 MG per 12 hr tablet   Oral   Take 1 tablet by mouth every 12 (twelve) hours.         . DUREZOL 0.05 % EMUL               . ezetimibe (ZETIA) 10 MG tablet   Oral   Take 10 mg by mouth daily.         Marland Kitchen  EXPIRED: furosemide (LASIX) 20 MG tablet   Oral   Take 1 tablet (20 mg total) by mouth daily.   30 tablet   1   . EXPIRED: isosorbide mononitrate (IMDUR) 30 MG 24 hr tablet   Oral   Take 1 tablet (30 mg total) by mouth daily.   30 tablet   1   . losartan (COZAAR) 50 MG tablet               . EXPIRED: metoprolol tartrate (LOPRESSOR) 25 MG tablet   Oral   Take 1 tablet (25 mg total) by mouth 2 (two) times daily.   60 tablet   1   . Montelukast Sodium (SINGULAIR PO)   Oral   Take by mouth daily.         Marland Kitchen MYRBETRIQ 25 MG TB24 tablet               . ondansetron (ZOFRAN-ODT) 8 MG disintegrating tablet               . predniSONE (DELTASONE) 5 MG tablet               . PROLENSA 0.07 % SOLN               . raloxifene (EVISTA) 60 MG tablet   Oral   Take 60 mg by mouth daily.         . rosuvastatin (CRESTOR) 20 MG tablet   Oral   Take 20 mg by mouth daily.         . sertraline (ZOLOFT) 50 MG tablet               . solifenacin (VESICARE) 10 MG tablet   Oral   Take by mouth daily.         . Vitamin D, Ergocalciferol, (DRISDOL) 50000 UNITS CAPS   Oral   Take  50,000 Units by mouth every 7 (seven) days.         . Wound Dressings (INTERDRY AG TEXTILE 10"X12') MISC      Apply to skin folds to help with healing; leave in place for 72 hours and then exchange; use until sking is healed. Keep area clean and dry.          BP 121/53  Pulse 90  Temp(Src) 98.7 F (37.1 C) (Oral)  Resp 14  Wt 190 lb (86.183 kg)  SpO2 96% Physical Exam  Constitutional: She is oriented to person, place, and time. She appears well-developed and well-nourished. No distress.  HENT:  Head: Normocephalic and atraumatic.  Right Ear: External ear normal.  Left Ear: External ear normal.  Nose: Nose normal.  Mouth/Throat: Oropharynx is clear and moist.  Eyes: Conjunctivae are normal.  Neck: Normal range of motion. Neck supple.  Cardiovascular: Normal rate, regular rhythm, normal heart sounds and intact distal pulses.   Pulmonary/Chest: Effort normal and breath sounds normal. No respiratory distress. She has no rales. She exhibits no tenderness.  Abdominal: Soft. Bowel sounds are normal. She exhibits no distension. There is no tenderness. There is no rebound and no guarding.  Musculoskeletal: Normal range of motion. She exhibits no edema.  Lymphadenopathy:    She has no cervical adenopathy.  Neurological: She is alert and oriented to person, place, and time.  Skin: Skin is warm and dry. She is not diaphoretic.    ED Course  Procedures (including critical care time) Medications  iohexol (OMNIPAQUE) 300 MG/ML solution 50 mL (50 mLs Oral Contrast Given  04/03/13 2145)  potassium chloride SA (K-DUR,KLOR-CON) CR tablet 40 mEq (40 mEq Oral Given 04/03/13 2308)  cefTRIAXone (ROCEPHIN) 1 g in dextrose 5 % 50 mL IVPB (0 g Intravenous Stopped 04/03/13 2338)    Labs Review Labs Reviewed  CBC WITH DIFFERENTIAL - Abnormal; Notable for the following:    WBC 18.4 (*)    Hemoglobin 11.5 (*)    MCH 25.7 (*)    RDW 16.5 (*)    Platelets 129 (*)    Neutrophils Relative % 92 (*)     Lymphocytes Relative 2 (*)    Neutro Abs 16.9 (*)    Lymphs Abs 0.4 (*)    Monocytes Absolute 1.1 (*)    All other components within normal limits  URINALYSIS, ROUTINE W REFLEX MICROSCOPIC - Abnormal; Notable for the following:    Color, Urine AMBER (*)    APPearance TURBID (*)    Hgb urine dipstick MODERATE (*)    Bilirubin Urine MODERATE (*)    Protein, ur 30 (*)    Urobilinogen, UA 4.0 (*)    Leukocytes, UA LARGE (*)    All other components within normal limits  BASIC METABOLIC PANEL - Abnormal; Notable for the following:    Sodium 131 (*)    Chloride 93 (*)    Glucose, Bld 111 (*)    BUN 58 (*)    Creatinine, Ser 2.41 (*)    GFR calc non Af Amer 18 (*)    GFR calc Af Amer 21 (*)    All other components within normal limits  URINE MICROSCOPIC-ADD ON - Abnormal; Notable for the following:    Squamous Epithelial / LPF FEW (*)    Bacteria, UA MANY (*)    Casts GRANULAR CAST (*)    All other components within normal limits  POCT I-STAT, CHEM 8 - Abnormal; Notable for the following:    Sodium 134 (*)    Potassium 3.0 (*)    BUN 54 (*)    Creatinine, Ser 2.70 (*)    Glucose, Bld 107 (*)    All other components within normal limits  URINE CULTURE   Imaging Review Ct Abdomen Pelvis Wo Contrast  04/04/2013   CLINICAL DATA:  Weakness and body aches  EXAM: CT ABDOMEN AND PELVIS WITHOUT CONTRAST  TECHNIQUE: Multidetector CT imaging of the abdomen and pelvis was performed following the standard protocol without intravenous contrast.  COMPARISON:  02/12/2012  FINDINGS: Atelectasis is noted in both lung bases. The heart size is enlarged. No pleural or pericardial. No focal liver abnormality identified. Sludge and/or stones noted within the dependent portion of gallbladder. No biliary dilatation. The pancreas is unremarkable. Normal appearance of the spleen.  The adrenal glands are both normal. Asymmetric right-sided hydronephrosis noted. Stone at the right UPJ measures 0.5 1.3 cm,  image 46/series 5. The left kidney appears within normal limits. The urinary bladder appears normal. The uterus is unremarkable. Cyst in the left ovary measures 3.5 cm.  There is calcified atherosclerotic disease affecting the abdominal aorta and its branches. No aneurysm. There is no upper abdominal or pelvic adenopathy.  The patient has a small hiatal hernia. The small bowel loops have a normal course and caliber no obstruction. Normal appearance of the proximal colon. Multiple distal colonic diverticula are identified without evidence for obstruction.  Review of the visualized osseous structures is significant for mild lumbar spondylosis.  IMPRESSION: 1. Stone at the right UPJ is identified resulting and asymmetric right-sided hydronephrosis. 2. Atherosclerotic disease. 3.  Cyst in the left ovary. This is almost certainly benign, but follow up ultrasound is recommended in 1 year according to the Society of Radiologists in Ultrasound2010 Consensus Conference Statement (D Lenis Noon et al. Management of Asymptomatic Ovarian and Other Adnexal Cysts Imaged at [place]: Society of Radiologists in Ultrasound Consensus Conference Statement 2010. Radiology 256 (Sept 2010): 943-954.).   Electronically Signed   By: Signa Kell M.D.   On: 04/04/2013 00:00   Dg Chest 2 View  04/03/2013   CLINICAL DATA:  Weakness. Body aches. Cough and some shortness of Breath.  EXAM: CHEST  2 VIEW  COMPARISON:  05/22/2011  FINDINGS: Moderate enlargement of the cardiac silhouette. The aorta is uncoiled and tortuous. No mediastinal or hilar masses.  No lung consolidation or edema. No convincing pleural effusion and no pneumothorax. The bony thorax is demineralized but grossly intact.  IMPRESSION: No acute cardiopulmonary disease.   Electronically Signed   By: Amie Portland M.D.   On: 04/03/2013 21:06    EKG Interpretation   None       MDM   1. Pyelonephritis   2. Nephrolithiasis     Afebrile, NAD, non-toxic appearing, AAOx4. Patient  fatigued appearing, but otherwise no concerning physical exam finding. Leukocytosis with left shift noted on CBC along with hematuria and positive leukocytes on ua. CXR negative for acute cardiopulmonary findings. CT abd/pelvis revealed 1.3cm without evidence of obstruction. Plan to admit patient to hospitalist for hydration, and IV abx. Do not feel urology needs to be emergently involved in patient case at this time. The patient appears reasonably stabilized for admission considering the current resources, flow, and capabilities available in the ED at this time, and I doubt any other [facility] requiring further screening and/or treatment in the ED prior to admission.Patient d/w with Dr. Micheline Maze, agrees with plan.        Jeannetta Ellis, PA-C 04/04/13 0128

## 2013-04-04 ENCOUNTER — Encounter (HOSPITAL_COMMUNITY)
Admission: EM | Disposition: A | Discharge status: Skilled Nursing Facility | Payer: Self-pay | Source: Home / Self Care | Attending: Internal Medicine

## 2013-04-04 ENCOUNTER — Encounter (HOSPITAL_COMMUNITY): Payer: Medicare Other | Admitting: Anesthesiology

## 2013-04-04 ENCOUNTER — Inpatient Hospital Stay (HOSPITAL_COMMUNITY): Payer: Medicare Other | Admitting: Anesthesiology

## 2013-04-04 ENCOUNTER — Encounter (HOSPITAL_COMMUNITY): Payer: Self-pay | Admitting: Anesthesiology

## 2013-04-04 DIAGNOSIS — J111 Influenza due to unidentified influenza virus with other respiratory manifestations: Secondary | ICD-10-CM

## 2013-04-04 DIAGNOSIS — N179 Acute kidney failure, unspecified: Secondary | ICD-10-CM | POA: Diagnosis present

## 2013-04-04 DIAGNOSIS — N200 Calculus of kidney: Secondary | ICD-10-CM | POA: Diagnosis present

## 2013-04-04 DIAGNOSIS — N12 Tubulo-interstitial nephritis, not specified as acute or chronic: Principal | ICD-10-CM

## 2013-04-04 DIAGNOSIS — N390 Urinary tract infection, site not specified: Secondary | ICD-10-CM

## 2013-04-04 HISTORY — PX: CYSTOSCOPY W/ URETERAL STENT PLACEMENT: SHX1429

## 2013-04-04 LAB — CBC
MCHC: 31.3 g/dL (ref 30.0–36.0)
Platelets: 116 10*3/uL — ABNORMAL LOW (ref 150–400)
RDW: 16.5 % — ABNORMAL HIGH (ref 11.5–15.5)
WBC: 14.7 10*3/uL — ABNORMAL HIGH (ref 4.0–10.5)

## 2013-04-04 LAB — BASIC METABOLIC PANEL
CO2: 25 mEq/L (ref 19–32)
Chloride: 95 mEq/L — ABNORMAL LOW (ref 96–112)
GFR calc Af Amer: 24 mL/min — ABNORMAL LOW (ref >90)
GFR calc non Af Amer: 20 mL/min — ABNORMAL LOW (ref >90)
Potassium: 3.9 mEq/L (ref 3.5–5.1)
Sodium: 131 mEq/L — ABNORMAL LOW (ref 135–145)

## 2013-04-04 LAB — INFLUENZA PANEL BY PCR (TYPE A & B)
H1N1 flu by pcr: NOT DETECTED
Influenza A By PCR: NEGATIVE
Influenza B By PCR: NEGATIVE

## 2013-04-04 SURGERY — CYSTOSCOPY, WITH RETROGRADE PYELOGRAM AND URETERAL STENT INSERTION
Anesthesia: General | Site: Ureter | Laterality: Right

## 2013-04-04 MED ORDER — IOHEXOL 300 MG/ML  SOLN
INTRAMUSCULAR | Status: DC | PRN
  Administered 2013-04-04: 16 mL via INTRAVENOUS

## 2013-04-04 MED ORDER — SODIUM CHLORIDE 0.9 % IR SOLN
Status: DC | PRN
  Administered 2013-04-04: 1000 mL

## 2013-04-04 MED ORDER — PROPOFOL 10 MG/ML IV BOLUS
INTRAVENOUS | Status: AC
  Filled 2013-04-04: qty 20

## 2013-04-04 MED ORDER — ONDANSETRON HCL 4 MG/2ML IJ SOLN
INTRAMUSCULAR | Status: AC
  Filled 2013-04-04: qty 2

## 2013-04-04 MED ORDER — BIOTENE DRY MOUTH MT LIQD
15.0000 mL | Freq: Two times a day (BID) | OROMUCOSAL | Status: DC
  Administered 2013-04-05 – 2013-04-07 (×5): 15 mL via OROMUCOSAL

## 2013-04-04 MED ORDER — LIDOCAINE HCL 1 % IJ SOLN
INTRAMUSCULAR | Status: DC | PRN
  Administered 2013-04-04: 60 mg via INTRADERMAL

## 2013-04-04 MED ORDER — FENTANYL CITRATE 0.05 MG/ML IJ SOLN
25.0000 ug | INTRAMUSCULAR | Status: DC | PRN
  Administered 2013-04-04 (×2): 50 ug via INTRAVENOUS

## 2013-04-04 MED ORDER — ACETAMINOPHEN 325 MG PO TABS
650.0000 mg | ORAL_TABLET | Freq: Once | ORAL | Status: AC
  Administered 2013-04-04: 650 mg via ORAL
  Filled 2013-04-04: qty 2

## 2013-04-04 MED ORDER — LIDOCAINE HCL (CARDIAC) 20 MG/ML IV SOLN
INTRAVENOUS | Status: AC
  Filled 2013-04-04: qty 5

## 2013-04-04 MED ORDER — FENTANYL CITRATE 0.05 MG/ML IJ SOLN
INTRAMUSCULAR | Status: AC
  Filled 2013-04-04: qty 2

## 2013-04-04 MED ORDER — FENTANYL CITRATE 0.05 MG/ML IJ SOLN
INTRAMUSCULAR | Status: DC | PRN
  Administered 2013-04-04: 50 ug via INTRAVENOUS

## 2013-04-04 MED ORDER — DEXTROSE 5 % IV SOLN
1.0000 g | INTRAVENOUS | Status: DC
  Administered 2013-04-04 – 2013-04-05 (×2): 1 g via INTRAVENOUS
  Filled 2013-04-04 (×3): qty 10

## 2013-04-04 MED ORDER — LACTATED RINGERS IV SOLN
INTRAVENOUS | Status: DC | PRN
  Administered 2013-04-04: 14:00:00 via INTRAVENOUS

## 2013-04-04 MED ORDER — CEFAZOLIN SODIUM-DEXTROSE 2-3 GM-% IV SOLR
INTRAVENOUS | Status: AC
  Filled 2013-04-04: qty 50

## 2013-04-04 MED ORDER — HEPARIN SODIUM (PORCINE) 5000 UNIT/ML IJ SOLN
5000.0000 [IU] | Freq: Three times a day (TID) | INTRAMUSCULAR | Status: DC
  Administered 2013-04-04 – 2013-04-07 (×9): 5000 [IU] via SUBCUTANEOUS
  Filled 2013-04-04 (×14): qty 1

## 2013-04-04 MED ORDER — CEFAZOLIN SODIUM-DEXTROSE 2-3 GM-% IV SOLR
2.0000 g | Freq: Once | INTRAVENOUS | Status: AC
  Administered 2013-04-04: 2 g via INTRAVENOUS

## 2013-04-04 MED ORDER — PROPOFOL 10 MG/ML IV BOLUS
INTRAVENOUS | Status: DC | PRN
  Administered 2013-04-04: 20 mg via INTRAVENOUS
  Administered 2013-04-04: 100 mg via INTRAVENOUS

## 2013-04-04 MED ORDER — ACETAMINOPHEN 325 MG PO TABS
650.0000 mg | ORAL_TABLET | Freq: Four times a day (QID) | ORAL | Status: DC | PRN
  Administered 2013-04-06 – 2013-04-07 (×2): 650 mg via ORAL
  Filled 2013-04-04 (×2): qty 2

## 2013-04-04 MED ORDER — INFLUENZA VAC SPLIT QUAD 0.5 ML IM SUSP
0.5000 mL | Freq: Once | INTRAMUSCULAR | Status: AC
  Administered 2013-04-05: 0.5 mL via INTRAMUSCULAR
  Filled 2013-04-04 (×2): qty 0.5

## 2013-04-04 MED ORDER — SODIUM CHLORIDE 0.9 % IV SOLN
INTRAVENOUS | Status: DC
  Administered 2013-04-04 – 2013-04-05 (×4): via INTRAVENOUS

## 2013-04-04 MED ORDER — PROMETHAZINE HCL 25 MG/ML IJ SOLN: 6.2500 mg | INTRAMUSCULAR | Status: DC | PRN

## 2013-04-04 MED ORDER — MORPHINE SULFATE 2 MG/ML IJ SOLN
1.0000 mg | INTRAMUSCULAR | Status: AC | PRN
  Administered 2013-04-04 – 2013-04-06 (×6): 1 mg via INTRAVENOUS
  Filled 2013-04-04 (×6): qty 1

## 2013-04-04 SURGICAL SUPPLY — 11 items
BAG URINE DRAINAGE (UROLOGICAL SUPPLIES) ×1 IMPLANT
BAG URO CATCHER STRL LF (DRAPE) ×1 IMPLANT
CATH FOLEY 2WAY SLVR  5CC 16FR (CATHETERS) ×1
CATH FOLEY 2WAY SLVR 5CC 16FR (CATHETERS) IMPLANT
CATH INTERMIT  6FR 70CM (CATHETERS) ×1 IMPLANT
CATH URET 5FR 28IN OPEN ENDED (CATHETERS) ×1 IMPLANT
DRAPE CAMERA CLOSED 9X96 (DRAPES) ×1 IMPLANT
GUIDEWIRE STR DUAL SENSOR (WIRE) ×1 IMPLANT
PACK CYSTO (CUSTOM PROCEDURE TRAY) ×1 IMPLANT
STENT CONTOUR 6FRX24X.038 (STENTS) ×1 IMPLANT
SYRINGE 10CC LL (SYRINGE) ×1 IMPLANT

## 2013-04-04 NOTE — Progress Notes (Signed)
Patient has rings on both hands that will not come off. Taped with plastic tape.

## 2013-04-04 NOTE — Transfer of Care (Signed)
Immediate Anesthesia Transfer of Care Note  Patient: Laura [name]  Procedure(s) Performed: Procedure(s): CYSTOSCOPY WITH RETROGRADE PYELOGRAM/URETERAL STENT PLACEMENT (Right)  Patient Location: PACU  Anesthesia Type:General  Level of Consciousness: awake, alert  and oriented  Airway & Oxygen Therapy: Patient Spontanous Breathing and Patient connected to face mask oxygen  Post-op Assessment: Report given to PACU RN and Post -op Vital signs reviewed and stable  Post vital signs: Reviewed and stable  Complications: No apparent anesthesia complications

## 2013-04-04 NOTE — Preoperative (Signed)
Beta Blockers   Reason not to administer Beta Blockers:Will monitor and give beta blocker as needed.

## 2013-04-04 NOTE — ED Provider Notes (Signed)
Medical screening examination/treatment/procedure(s) were conducted as a shared visit with non-physician practitioner(s) and myself.  I personally evaluated the patient during the encounter. Pt presents w/ 3 days cough, n/v, fatigue, myalgias, subjective fever & chills. On PE, pt is non-toxic appearing, in NAD.  Cardiopulm & abdominal exam benign. Pt found to have pyelonephritis w/ small stone at L UPJ.  IVF, IV abx initiated. Pt admitted to triad.    EKG Interpretation    Date/Time:    Ventricular Rate:    PR Interval:    QRS Duration:   QT Interval:    QTC Calculation:   R Axis:     Text Interpretation:                Shanna Cisco, MD 04/04/13 1147

## 2013-04-04 NOTE — Progress Notes (Signed)
pacu nursing -  Two rings taped  One silvertone necklace in plastic bag from operating room, returned to room with patient

## 2013-04-04 NOTE — Consult Note (Signed)
Urology Consult  Requesting provider:  Dr. Philip Aspen  CC: Right nephrolithiasis  HPI: 79 year old female presented to the ER last night with malaise. Her symptoms were similar to the flu. Urine was noted to be concerning for infection do to increase WBCs and positive leukocyte esterase. Urine culture is pending. CT scan of the abdomen and pelvis without contrast revealed a right renal stone. This is located at the ureteropelvic junction. It was 1.2 x 0.6 cm in size. It was associated with right-sided hydronephrosis. The patient denies flank pain or dysuria. She has not had any gross hematuria. She denies history of kidney stones. Nothing makes it better or worse. I reviewed the images and showed images to the patient. I explained that the stone is obstructing and could be the cause of her symptoms. She may also have the flu, or she could have both. She has had a low-grade fever to 100 at home.  Today we discussed the management of obstructing urinary stones. These options include observation with pain control, percutaneous nephrostomy tube, or cystoscopy with retrograde ureteral stent placement.  We discussed the risks, benefits, alternatives and likelihood of achieving the patient's goals.  We discussed which options are relevant to this particular situation. We discussed the natural history of stones and the as well as the complications of untreated stones and the impact on quality of life without treatment as well as with each of the above listed treatments. We also discussed the efficacy of each treatment. With any of these management options I discussed the signs and symptoms of infection and the need for emergent treatment should these be experienced.   For observation I described the risks which include, but are not limited to, silent renal damage, life-threatening infection, need for emergent surgery, failure to pass stone, and pain. I do not recommend this option as she will not get better  with this option alone.  For stent placement I described the risks which include, but are not limited to, heart attack, stroke, pulmonary embolus, death, bleeding, infection, damage to contiguous structures, positioning injury, ureteral stricture, ureteral avulsion, ureteral injury, stent discomfort and pain.  For percutaneous nephrostomy tube I described the risks which including, but are not limited to, heart attack, stroke, pulmonary embolus, death, arterial venous fistula or malformation, need for embolization of kidney, loss of kidney or renal function.  The patient had the opportunity to ask questions to their stated satisfaction and voiced understanding.   PMH: Past Medical History  Diagnosis Date  . Coronary artery disease   . Myocardial infarction   . Hypertension   . Arthritis   . Asthma   . Chronic bronchitis     PSH: Past Surgical History  Procedure Laterality Date  . Coronary stent placement    . Appendectomy    . Tonsillectomy    . Knee surgery      Allergies: Allergies  Allergen Reactions  . Codeine Nausea And Vomiting  . Hydrocodone Other (See Comments)    delusions    Medications: Prescriptions prior to admission  Medication Sig Dispense Refill  . albuterol (PROVENTIL HFA;VENTOLIN HFA) 108 (90 BASE) MCG/ACT inhaler Inhale 2 puffs into the lungs every 4 (four) hours as needed. For cough or wheezing      . aspirin 81 MG tablet Take 160 mg by mouth daily.      . metoprolol succinate (TOPROL-XL) 100 MG 24 hr tablet Take 100 mg by mouth daily. Take with or immediately following a meal.      .  allopurinol (ZYLOPRIM) 300 MG tablet       . azithromycin (ZITHROMAX) 500 MG tablet       . BESIVANCE 0.6 % SUSP       . cefdinir (OMNICEF) 300 MG capsule Take 300 mg by mouth 2 (two) times daily.      . COLCHICINE PO Take by mouth.      . dextromethorphan-guaiFENesin (MUCINEX DM) 30-600 MG per 12 hr tablet Take 1 tablet by mouth every 12 (twelve) hours.      . DUREZOL  0.05 % EMUL       . ezetimibe (ZETIA) 10 MG tablet Take 10 mg by mouth daily.      . furosemide (LASIX) 20 MG tablet Take 1 tablet (20 mg total) by mouth daily.  30 tablet  1  . isosorbide mononitrate (IMDUR) 30 MG 24 hr tablet Take 1 tablet (30 mg total) by mouth daily.  30 tablet  1  . losartan (COZAAR) 50 MG tablet       . metoprolol tartrate (LOPRESSOR) 25 MG tablet Take 1 tablet (25 mg total) by mouth 2 (two) times daily.  60 tablet  1  . Montelukast Sodium (SINGULAIR PO) Take by mouth daily.      Marland Kitchen MYRBETRIQ 25 MG TB24 tablet       . ondansetron (ZOFRAN-ODT) 8 MG disintegrating tablet       . predniSONE (DELTASONE) 5 MG tablet       . PROLENSA 0.07 % SOLN       . raloxifene (EVISTA) 60 MG tablet Take 60 mg by mouth daily.      . rosuvastatin (CRESTOR) 20 MG tablet Take 20 mg by mouth daily.      . sertraline (ZOLOFT) 50 MG tablet       . solifenacin (VESICARE) 10 MG tablet Take by mouth daily.      . Vitamin D, Ergocalciferol, (DRISDOL) 50000 UNITS CAPS Take 50,000 Units by mouth every 7 (seven) days.      . Wound Dressings (INTERDRY AG TEXTILE 10"X12') MISC Apply to skin folds to help with healing; leave in place for 72 hours and then exchange; use until sking is healed. Keep area clean and dry.         Social History: History   Social History  . Marital Status: Married    Spouse Name: N/A    Number of Children: N/A  . Years of Education: N/A   Occupational History  . Not on file.   Social History Main Topics  . Smoking status: Never Smoker   . Smokeless tobacco: Never Used  . Alcohol Use: No  . Drug Use: No  . Sexual Activity: Not on file   Other Topics Concern  . Not on file   Social History Narrative  . No narrative on file    Family History: Family History  Problem Relation Age of Onset  . Adopted: Yes    Review of Systems: Positive: Low grade fever. Cough.  Negative: Chest pain, SOB, or nausea.  A further 10 point review of systems was negative except  what is listed in the HPI.  Physical Exam: Filed Vitals:   04/04/13 0359  BP: 132/63  Pulse: 79  Temp: 98 F (36.7 C)  Resp: 20    General: No acute distress.  Awake. Head:  Normocephalic.  Atraumatic. ENT:  EOMI.  Mucous membranes moist Neck:  Supple.  No lymphadenopathy. CV:  S1 present. S2 present. Regular rate. Pulmonary: Equal effort  bilaterally.  Clear to auscultation bilaterally. Abdomen: Soft.  Non- tender to palpation. Skin:  Normal turgor.  No visible rash. Extremity: No gross deformity of bilateral upper extremities.  No gross deformity of    bilateral lower extremities. Neurologic: Alert. Appropriate mood.    Studies:  Recent Labs     04/03/13  2031  04/03/13  2041  04/04/13  0514  HGB  11.5*  13.3  11.0*  WBC  18.4*   --   14.7*  PLT  129*   --   116*    Recent Labs     04/03/13  2031  04/03/13  2041  04/04/13  0514  NA  131*  134*  131*  K  3.6  3.0*  3.9  CL  93*  97  95*  CO2  26   --   25  BUN  58*  54*  53*  CREATININE  2.41*  2.70*  2.14*  CALCIUM  9.3   --   8.7  GFRNONAA  18*   --   20*  GFRAA  21*   --   24*     No results found for this basename: PT, INR, APTT,  in the last 72 hours   No components found with this basename: ABG,     Assessment:  Right nephrolithiasis. Likely UTI.  Plan: I recommend proceeding to the operating room for cystoscopy, right retrograde pyelogram, and right ureter stent placement. We have discussed the risk and benefits, alternatives, and likelihood of achieving goals.  I explained to the patient why we would not manipulate the stone today as this would greatly increase her chance of becoming more ill.  Continue the patient n.p.o.  The operating room has been notified.    Pager: [phone]    CC: Dr. Philip Aspen

## 2013-04-04 NOTE — H&P (Signed)
[facility] History and Physical  Laura [name] NWG:[id number] DOB: 1929-12-15 DOA: 04/03/2013  Referring physician: EDP PCP: Ezequiel Kayser, MD   Chief Complaint: Flu like symptoms   HPI: Laura [name] is a 79 y.o. female who presents to the ED with fever, chills, cough, generalized body aches.  Symptoms onset on Monday and have worsened over the week.  Denies back or flank pain or dysuria.  Cough is productive of whitish sputum.  Patient does report one sick contact with similar symptoms previously.  Work up in the ED is positive for UTI with right UPJ stone and R sided hydronephrosis (despite patients lack of symptoms for such).  Review of Systems: Systems reviewed.  As above, otherwise negative  Past Medical History  Diagnosis Date  . Coronary artery disease   . Myocardial infarction   . Hypertension   . Arthritis   . Asthma   . Chronic bronchitis    Past Surgical History  Procedure Laterality Date  . Coronary stent placement    . Appendectomy    . Tonsillectomy    . Knee surgery     Social History:  reports that she has never smoked. She has never used smokeless tobacco. She reports that she does not drink alcohol or use illicit drugs.  Allergies  Allergen Reactions  . Codeine Nausea And Vomiting  . Hydrocodone Other (See Comments)    delusions    Family History  Problem Relation Age of Onset  . Adopted: Yes     Prior to Admission medications   Medication Sig Start Date End Date Taking? Authorizing Provider  albuterol (PROVENTIL HFA;VENTOLIN HFA) 108 (90 BASE) MCG/ACT inhaler Inhale 2 puffs into the lungs every 4 (four) hours as needed. For cough or wheezing   Yes Historical Provider, MD  aspirin 81 MG tablet Take 160 mg by mouth daily.   Yes Historical Provider, MD  metoprolol succinate (TOPROL-XL) 100 MG 24 hr tablet Take 100 mg by mouth daily. Take with or immediately following a meal.   Yes Historical Provider, MD  allopurinol (ZYLOPRIM) 300 MG  tablet  01/21/13   Historical Provider, MD  azithromycin (ZITHROMAX) 500 MG tablet  03/26/13   Historical Provider, MD  BESIVANCE 0.6 % SUSP  02/02/13   Historical Provider, MD  cefdinir (OMNICEF) 300 MG capsule Take 300 mg by mouth 2 (two) times daily.    Historical Provider, MD  COLCHICINE PO Take by mouth.    Historical Provider, MD  dextromethorphan-guaiFENesin (MUCINEX DM) 30-600 MG per 12 hr tablet Take 1 tablet by mouth every 12 (twelve) hours.    Historical Provider, MD  DUREZOL 0.05 % EMUL  02/02/13   Historical Provider, MD  ezetimibe (ZETIA) 10 MG tablet Take 10 mg by mouth daily.    Historical Provider, MD  furosemide (LASIX) 20 MG tablet Take 1 tablet (20 mg total) by mouth daily. 05/24/11 05/23/12  Vassie Loll, MD  isosorbide mononitrate (IMDUR) 30 MG 24 hr tablet Take 1 tablet (30 mg total) by mouth daily. 05/24/11 05/23/12  Vassie Loll, MD  losartan (COZAAR) 50 MG tablet  01/27/13   Historical Provider, MD  metoprolol tartrate (LOPRESSOR) 25 MG tablet Take 1 tablet (25 mg total) by mouth 2 (two) times daily. 05/24/11 05/23/12  Vassie Loll, MD  Montelukast Sodium (SINGULAIR PO) Take by mouth daily.    Historical Provider, MD  MYRBETRIQ 25 MG TB24 tablet  02/26/13   Historical Provider, MD  ondansetron (ZOFRAN-ODT) 8 MG disintegrating tablet  03/31/13  Historical Provider, MD  predniSONE (DELTASONE) 5 MG tablet  01/06/13   Historical Provider, MD  PROLENSA 0.07 % SOLN  02/02/13   Historical Provider, MD  raloxifene (EVISTA) 60 MG tablet Take 60 mg by mouth daily.    Historical Provider, MD  rosuvastatin (CRESTOR) 20 MG tablet Take 20 mg by mouth daily.    Historical Provider, MD  sertraline (ZOLOFT) 50 MG tablet  04/03/13   Historical Provider, MD  solifenacin (VESICARE) 10 MG tablet Take by mouth daily.    Historical Provider, MD  Vitamin D, Ergocalciferol, (DRISDOL) 50000 UNITS CAPS Take 50,000 Units by mouth every 7 (seven) days.    Historical Provider, MD  Wound Dressings  (INTERDRY AG TEXTILE 10"X12') MISC Apply to skin folds to help with healing; leave in place for 72 hours and then exchange; use until sking is healed. Keep area clean and dry. 05/24/11   Vassie Loll, MD   Physical Exam: Filed Vitals:   04/04/13 0055  BP: 121/53  Pulse: 90  Temp:   Resp: 14    BP 121/53  Pulse 90  Temp(Src) 98.7 F (37.1 C) (Oral)  Resp 14  Wt 86.183 kg (190 lb)  SpO2 96%  General Appearance:    Alert, oriented, no distress, appears stated age  Head:    Normocephalic, atraumatic  Eyes:    PERRL, EOMI, sclera non-icteric        Nose:   Nares without drainage or epistaxis. Mucosa, turbinates normal  Throat:   Moist mucous membranes. Oropharynx without erythema or exudate.  Neck:   Supple. No carotid bruits.  No thyromegaly.  No lymphadenopathy.   Back:     No CVA tenderness despite CT findings, no spinal tenderness  Lungs:     Clear to auscultation bilaterally, without wheezes, rhonchi or rales  Chest wall:    No tenderness to palpitation  Heart:    Regular rate and rhythm without murmurs, gallops, rubs  Abdomen:     Soft, non-tender, nondistended, normal bowel sounds, no organomegaly  Genitalia:    deferred  Rectal:    deferred  Extremities:   No clubbing, cyanosis or edema.  Pulses:   2+ and symmetric all extremities  Skin:   Skin color, texture, turgor normal, no rashes or lesions  Lymph nodes:   Cervical, supraclavicular, and axillary nodes normal  Neurologic:   CNII-XII intact. Normal strength, sensation and reflexes      throughout    Labs on Admission:  Basic Metabolic Panel:  Recent Labs Lab 04/03/13 2031 04/03/13 2041  NA 131* 134*  Laura 3.6 3.0*  CL 93* 97  CO2 26  --   GLUCOSE 111* 107*  BUN 58* 54*  CREATININE 2.41* 2.70*  CALCIUM 9.3  --    Liver Function Tests: No results found for this basename: AST, ALT, ALKPHOS, BILITOT, PROT, ALBUMIN,  in the last 168 hours No results found for this basename: LIPASE, AMYLASE,  in the last 168  hours No results found for this basename: AMMONIA,  in the last 168 hours CBC:  Recent Labs Lab 04/03/13 2031 04/03/13 2041  WBC 18.4*  --   NEUTROABS 16.9*  --   HGB 11.5* 13.3  HCT 36.3 39.0  MCV 81.0  --   PLT 129*  --    Cardiac Enzymes: No results found for this basename: CKTOTAL, CKMB, CKMBINDEX, TROPONINI,  in the last 168 hours  BNP (last 3 results) No results found for this basename: PROBNP,  in the  last 8760 hours CBG: No results found for this basename: GLUCAP,  in the last 168 hours  Radiological Exams on Admission: Ct Abdomen Pelvis Wo Contrast  04/04/2013   CLINICAL DATA:  Weakness and body aches  EXAM: CT ABDOMEN AND PELVIS WITHOUT CONTRAST  TECHNIQUE: Multidetector CT imaging of the abdomen and pelvis was performed following the standard protocol without intravenous contrast.  COMPARISON:  02/12/2012  FINDINGS: Atelectasis is noted in both lung bases. The heart size is enlarged. No pleural or pericardial. No focal liver abnormality identified. Sludge and/or stones noted within the dependent portion of gallbladder. No biliary dilatation. The pancreas is unremarkable. Normal appearance of the spleen.  The adrenal glands are both normal. Asymmetric right-sided hydronephrosis noted. Stone at the right UPJ measures 0.5 1.3 cm, image 46/series 5. The left kidney appears within normal limits. The urinary bladder appears normal. The uterus is unremarkable. Cyst in the left ovary measures 3.5 cm.  There is calcified atherosclerotic disease affecting the abdominal aorta and its branches. No aneurysm. There is no upper abdominal or pelvic adenopathy.  The patient has a small hiatal hernia. The small bowel loops have a normal course and caliber no obstruction. Normal appearance of the proximal colon. Multiple distal colonic diverticula are identified without evidence for obstruction.  Review of the visualized osseous structures is significant for mild lumbar spondylosis.  IMPRESSION: 1.  Stone at the right UPJ is identified resulting and asymmetric right-sided hydronephrosis. 2. Atherosclerotic disease. 3. Cyst in the left ovary. This is almost certainly benign, but follow up ultrasound is recommended in 1 year according to the Society of Radiologists in Ultrasound2010 Consensus Conference Statement (D Lenis Noon et al. Management of Asymptomatic Ovarian and Other Adnexal Cysts Imaged at [place]: Society of Radiologists in Ultrasound Consensus Conference Statement 2010. Radiology 256 (Sept 2010): 943-954.).   Electronically Signed   By: Signa Kell M.D.   On: 04/04/2013 00:00   Dg Chest 2 View  04/03/2013   CLINICAL DATA:  Weakness. Body aches. Cough and some shortness of Breath.  EXAM: CHEST  2 VIEW  COMPARISON:  05/22/2011  FINDINGS: Moderate enlargement of the cardiac silhouette. The aorta is uncoiled and tortuous. No mediastinal or hilar masses.  No lung consolidation or edema. No convincing pleural effusion and no pneumothorax. The bony thorax is demineralized but grossly intact.  IMPRESSION: No acute cardiopulmonary disease.   Electronically Signed   By: Amie Portland M.D.   On: 04/03/2013 21:06    EKG: Independently reviewed.  Assessment/Plan Active Problems:   UTI (urinary tract infection)   Influenza-like illness   Nephrolithiasis   Complicated UTI (urinary tract infection)   1. UTI complicated by R UPJ stone and R hydronephrosis - patient on rocephin, not septic at this time, consult urology in AM.  Oddly enough despite the R hydronephrosis, UTI and kidney stone findings, the patient is completely asymptomatic with regards to flank pain, dysuria, etc. 2. AKI - pre-renal vs post renal due to stone, IVF, consult urology in AM. 3. ILI - sounds like influenza, vs atypical symptoms of her UTI, influenza PCR pending, out of window for tamiflu.    Code Status: Full Code  Family Communication: No family in room Disposition Plan: Admit to inpatient   Time spent: 70  min  Laura [name] M. [facility] Pager [phone]  If 7AM-7PM, please contact the day team taking care of the patient [email or web address] Password [email or web address] 04/04/2013, 2:43 AM

## 2013-04-04 NOTE — Op Note (Signed)
Urology Operative Report  Date of Procedure: 04/04/13  Surgeon: Natalia Leatherwood, MD Assistant:  None  Preoperative Diagnosis: Right nephrolithiasis. UTI. Postoperative Diagnosis:  Same  Procedure(s): Cystoscopy Right retrograde pyelogram with interpretation Right ureter stent placement-6 x 24  Estimated blood loss: None  Specimen: None  Drains: 16 French Foley catheter  Complications: None  Findings: Return of purulent material with placement of sensor wire and ureter stent. Hydronephrosis seen on retrograde pyelogram. Stone not readily visible on fluoroscopy.  History of present illness: 79 year old female presented with Mallett's. She was admitted for flulike symptoms and a CT scan revealed an obstructing right renal stone at the ureteral pelvic junction on the right. It appeared that she had a urinary tract infection and I was counseled. I recommended placement of a right ureter stent today in the operating room.   Procedure in detail: After informed consent was obtained, the patient was taken to the operating room. They were placed in the supine position. SCDs were turned on and in place. IV antibiotics were infused, and general anesthesia was induced. A timeout was performed in which the correct patient, surgical site, and procedure were identified and agreed upon by the team.  The patient was placed in a dorsolithotomy position, making sure to pad all pertinent neurovascular pressure points. The genitals were prepped and draped in the usual sterile fashion.  A rigid cystoscope was advanced through the urethra and into the bladder. There was purulence seen around the right ureter orifice. The bladder was drained and then fully distended and evaluated in a systematic fashion to visualize the entire surface the bladder. This was negative for tumors.  I intended to perform a right retrograde pyeloram and attention was turned to the right ureter orifice. This was cannulated with  a 5 [place] ureter catheter and I injected contrast to obtain a retrograde pyelogram. There was tortuosity of the mid ureter and contrast a hard time going up beyond the right proximal ureter. I then advanced the 5 French catheter further into the proximal ureter and injected contrast again and this time I was able to see contrast fill out the renal pelvis. The stone was not readily visible on fluoroscopy but there was a filling defect representing the stone at the right ureteropelvic junction. I used a total of approximately 15 cc of Omnipaque for the injection.   I then withdrew the 5 [place] ureter catheter and placed a sensor tip wire through the cystoscope into the right ureter, and up into the right renal pelvis beyond the area where the stone was located on fluoroscopy. I then loaded a 6 x 24 double-J ureter stent over the wire with ease and into the right renal pelvis beyond the stone. This was deployed with a good curl in the right renal pelvis beyond the stone and a good curl in the bladder. The tethering string was removed. There was return of more purulent material. I withdrew the cystoscope and placed a 16 French Foley catheter inflated the balloon with 10 cc of sterile water.  The patient was placed back in a supine position, anesthesia was reversed, and she was taken to [facility] in stable condition.  I will notify the hospitalist of the patient's current condition and warn them regarding concern over infection.  All counts were correct at the end of the case.

## 2013-04-04 NOTE — Anesthesia Preprocedure Evaluation (Addendum)
Anesthesia Evaluation  Patient identified by MRN, date of birth, ID band Patient awake    Reviewed: Allergy & Precautions, H&P , NPO status , Patient's Chart, lab work & pertinent test results  Airway Mallampati: II TM Distance: <3 FB Neck ROM: Full    Dental no notable dental hx.    Pulmonary shortness of breath, asthma , COPD oxygen dependent,  O2 at night breath sounds clear to auscultation  + decreased breath sounds      Cardiovascular hypertension, + CAD, + Past MI, + Cardiac Stents, + Peripheral Vascular Disease and +CHF Rhythm:Regular Rate:Normal  Left ventricle:  Systolic function was normal. The estimated ejection fraction was in the range of 50% to 55%. Images were inadequate for LV wall motion assessment.  Findings: Fusiform descending thoracic aortic aneurysm measuring 4.4 cm (series 2/image 47), unchanged.  Aneurysm extends to just above the level of the celiac artery origin.  Associated eccentric mural thrombus.  No evidence of aortic dissection.   Additional thoracic aortic measurements are as follows: --Descending thoracic aorta:  3.6 cm (series 4/image 31) --Proximal descending thoracic aorta:  2.5 cm (series 2/image 22) --Aortic hiatus:  2.7 cm (series 2/image 60)     Neuro/Psych negative neurological ROS  negative psych ROS   GI/Hepatic negative GI ROS, Neg liver ROS,   Endo/Other  Morbid obesity  Renal/GU negative Renal ROS  negative genitourinary   Musculoskeletal negative musculoskeletal ROS (+)   Abdominal   Peds negative pediatric ROS (+)  Hematology negative hematology ROS (+)   Anesthesia Other Findings   Reproductive/Obstetrics negative OB ROS                      Anesthesia Physical Anesthesia Plan  ASA: III  Anesthesia Plan: General   Post-op Pain Management:    Induction: Intravenous  Airway Management Planned: LMA  Additional Equipment:   Intra-op  Plan:   Post-operative Plan: Extubation in OR  Informed Consent: I have reviewed the patients History and Physical, chart, labs and discussed the procedure including the risks, benefits and alternatives for the proposed anesthesia with the patient or authorized representative who has indicated his/her understanding and acceptance.   Dental advisory given  Plan Discussed with: CRNA and Surgeon  Anesthesia Plan Comments:         Anesthesia Quick Evaluation

## 2013-04-04 NOTE — Progress Notes (Signed)
Patient seen and examined in PACU. Admitted earlier this morning for flu-like symptoms. Because of UTI and fever, a CT scan of the abdomen was performed that showed a right UPJ stone with right hydronephrosis. GU, Dr. Margarita Grizzle was consulted who took patient to the OR today for stent placement. He called me to alert me of findings of significant purulent material in her GU tract. If patient clinically worsens will need to broaden abx therapy and check blood cx for potential bacteremia. Will continue to follow closely.  Peggye Pitt, MD [facility] Pager: [phone]

## 2013-04-05 DIAGNOSIS — I10 Essential (primary) hypertension: Secondary | ICD-10-CM

## 2013-04-05 DIAGNOSIS — N200 Calculus of kidney: Secondary | ICD-10-CM

## 2013-04-05 LAB — CBC
HCT: 37 % (ref 36.0–46.0)
Hemoglobin: 11.3 g/dL — ABNORMAL LOW (ref 12.0–15.0)
MCH: 25.5 pg — ABNORMAL LOW (ref 26.0–34.0)
MCHC: 30.5 g/dL (ref 30.0–36.0)
MCV: 83.5 fL (ref 78.0–100.0)
Platelets: 131 10*3/uL — ABNORMAL LOW (ref 150–400)
WBC: 11.3 10*3/uL — ABNORMAL HIGH (ref 4.0–10.5)

## 2013-04-05 LAB — BASIC METABOLIC PANEL
BUN: 37 mg/dL — ABNORMAL HIGH (ref 6–23)
Chloride: 102 mEq/L (ref 96–112)
Glucose, Bld: 86 mg/dL (ref 70–99)
Potassium: 4.1 mEq/L (ref 3.5–5.1)

## 2013-04-05 LAB — URINE CULTURE

## 2013-04-05 MED ORDER — METOPROLOL TARTRATE 25 MG PO TABS
25.0000 mg | ORAL_TABLET | Freq: Two times a day (BID) | ORAL | Status: DC
  Administered 2013-04-05 – 2013-04-07 (×4): 25 mg via ORAL
  Filled 2013-04-05 (×6): qty 1

## 2013-04-05 MED ORDER — TAMSULOSIN HCL 0.4 MG PO CAPS
0.4000 mg | ORAL_CAPSULE | Freq: Every day | ORAL | Status: DC
  Administered 2013-04-05 – 2013-04-06 (×2): 0.4 mg via ORAL
  Filled 2013-04-05 (×3): qty 1

## 2013-04-05 MED ORDER — OXYBUTYNIN CHLORIDE 5 MG PO TABS
5.0000 mg | ORAL_TABLET | Freq: Four times a day (QID) | ORAL | Status: DC | PRN
  Filled 2013-04-05: qty 1

## 2013-04-05 MED ORDER — GUAIFENESIN-DM 100-10 MG/5ML PO SYRP: 5.0000 mL | ORAL_SOLUTION | ORAL | Status: DC | PRN

## 2013-04-05 NOTE — Anesthesia Postprocedure Evaluation (Signed)
  Anesthesia Post-op Note  Patient: Laura [name]  Procedure(s) Performed: Procedure(s) (LRB): CYSTOSCOPY WITH RETROGRADE PYELOGRAM/URETERAL STENT PLACEMENT (Right)  Patient Location: PACU  Anesthesia Type: General  Level of Consciousness: awake and alert   Airway and Oxygen Therapy: Patient Spontanous Breathing  Post-op Pain: mild  Post-op Assessment: Post-op Vital signs reviewed, Patient's Cardiovascular Status Stable, Respiratory Function Stable, Patent Airway and No signs of Nausea or vomiting  Last Vitals:  Filed Vitals:   04/05/13 0447  BP: 152/71  Pulse: 84  Temp: 37.2 C  Resp: 16    Post-op Vital Signs: stable   Complications: No apparent anesthesia complications

## 2013-04-05 NOTE — Progress Notes (Signed)
Nutrition Brief Note  Patient identified on the Malnutrition Screening Tool (MST) Report  Wt Readings from Last 15 Encounters:  04/04/13 200 lb (90.719 kg)  04/04/13 200 lb (90.719 kg)  09/30/12 195 lb (88.451 kg)  02/19/12 205 lb (92.987 kg)  05/24/11 208 lb 1.8 oz (94.4 kg)    Body mass index is 33.81 kg/(m^2). Patient meets criteria for obesity grade 1 based on current BMI.   Current diet order is regular, patient is consuming approximately 75% of meals at this time. Labs and medications reviewed.   Patient reports good intake on regular diet prior to admit.  "I don't eat a lot because I don't get hungry" but denies recent weight loss.  No nutrition interventions warranted at this time. If nutrition issues arise, please consult RD.   Oran Rein, RD, LDN Clinical Inpatient Dietitian Pager:  [phone] Weekend and after hours pager:  [phone]

## 2013-04-05 NOTE — Progress Notes (Signed)
[facility] PROGRESS NOTE  Laura [name] KVQ:[id number] DOB: 04-14-1929 DOA: 04/03/2013 PCP: Ezequiel Kayser, MD  Assessment/Plan: Pyelonephritis -Urine culture growing Escherichia coli -Continue IV antibiotics until the time of discharge -Plan to discharge patient on ciprofloxacin 500 mg twice a day Right hydronephrosis -Secondary to right UPJ stone -appreciate Dr. Margarita Grizzle -s/p R-ureteral stent 04/04/13 -definitive stone treatment as outpt after infection is treated AKI -improving with IVF -continue IVF Hypertension -Hold ARB due to renal failure -Restart metoprolol tartrate -Hold furosemide due to renal failure Asthma -Patient is on 2 L nasal cannula at home -Clinically stable Deconditioning -PT evaluation -Husband expressed concern that he may not be able to take care of her  Family Communication:   husband at beside Disposition Plan:   Home when medically stable     Antibiotics:  Ceftriaxone 04/03/2013>>>       Procedures/Studies: Ct Abdomen Pelvis Wo Contrast  04/04/2013   CLINICAL DATA:  Weakness and body aches  EXAM: CT ABDOMEN AND PELVIS WITHOUT CONTRAST  TECHNIQUE: Multidetector CT imaging of the abdomen and pelvis was performed following the standard protocol without intravenous contrast.  COMPARISON:  02/12/2012  FINDINGS: Atelectasis is noted in both lung bases. The heart size is enlarged. No pleural or pericardial. No focal liver abnormality identified. Sludge and/or stones noted within the dependent portion of gallbladder. No biliary dilatation. The pancreas is unremarkable. Normal appearance of the spleen.  The adrenal glands are both normal. Asymmetric right-sided hydronephrosis noted. Stone at the right UPJ measures 0.5 1.3 cm, image 46/series 5. The left kidney appears within normal limits. The urinary bladder appears normal. The uterus is unremarkable. Cyst in the left ovary measures 3.5 cm.  There is calcified atherosclerotic disease  affecting the abdominal aorta and its branches. No aneurysm. There is no upper abdominal or pelvic adenopathy.  The patient has a small hiatal hernia. The small bowel loops have a normal course and caliber no obstruction. Normal appearance of the proximal colon. Multiple distal colonic diverticula are identified without evidence for obstruction.  Review of the visualized osseous structures is significant for mild lumbar spondylosis.  IMPRESSION: 1. Stone at the right UPJ is identified resulting and asymmetric right-sided hydronephrosis. 2. Atherosclerotic disease. 3. Cyst in the left ovary. This is almost certainly benign, but follow up ultrasound is recommended in 1 year according to the Society of Radiologists in Ultrasound2010 Consensus Conference Statement (D Lenis Noon et al. Management of Asymptomatic Ovarian and Other Adnexal Cysts Imaged at [place]: Society of Radiologists in Ultrasound Consensus Conference Statement 2010. Radiology 256 (Sept 2010): 943-954.).   Electronically Signed   By: Signa Kell M.D.   On: 04/04/2013 00:00   Dg Chest 2 View  04/03/2013   CLINICAL DATA:  Weakness. Body aches. Cough and some shortness of Breath.  EXAM: CHEST  2 VIEW  COMPARISON:  05/22/2011  FINDINGS: Moderate enlargement of the cardiac silhouette. The aorta is uncoiled and tortuous. No mediastinal or hilar masses.  No lung consolidation or edema. No convincing pleural effusion and no pneumothorax. The bony thorax is demineralized but grossly intact.  IMPRESSION: No acute cardiopulmonary disease.   Electronically Signed   By: Amie Portland M.D.   On: 04/03/2013 21:06         Subjective: Patient complains of some suprapubic discomfort and right flank pain. Denies any fevers, chills, chest pain, shortness breath, vomiting, diarrhea, abdominal pain. No headaches or dizziness.  Objective: Filed Vitals:   04/04/13 1529 04/04/13 2120 04/05/13 0447 04/05/13 1520  BP:  129/66 129/63 152/71 157/74  Pulse: 81 82 84 76   Temp: 98.2 F (36.8 C) 98.8 F (37.1 C) 98.9 F (37.2 C) 97.4 F (36.3 C)  TempSrc:  Oral Oral Oral  Resp: 20 18 16 16   Height:      Weight:      SpO2: 97% 98% 98% 96%    Intake/Output Summary (Last 24 hours) at 04/05/13 1603 Last data filed at 04/05/13 1500  Gross per 24 hour  Intake 2802.5 ml  Output   1300 ml  Net 1502.5 ml   Weight change:  Exam:   General:  Pt is alert, follows commands appropriately, not in acute distress  HEENT: No icterus, No thrush,[place]/AT  Cardiovascular: RRR, S1/S2, no rubs, no gallops  Respiratory: Bilateral scattered rales. Minimal bibasilar wheeze. Good movement.  Abdomen: Soft/+BS, non tender, non distended, no guarding  Extremities: trace LE edema, No lymphangitis, No petechiae, No rashes, no synovitis  Data Reviewed: Basic Metabolic Panel:  Recent Labs Lab 04/03/13 2031 04/03/13 2041 04/04/13 0514 04/05/13 0455  NA 131* 134* 131* 137  K 3.6 3.0* 3.9 4.1  CL 93* 97 95* 102  CO2 26  --  25 25  GLUCOSE 111* 107* 79 86  BUN 58* 54* 53* 37*  CREATININE 2.41* 2.70* 2.14* 1.44*  CALCIUM 9.3  --  8.7 8.6   Liver Function Tests: No results found for this basename: AST, ALT, ALKPHOS, BILITOT, PROT, ALBUMIN,  in the last 168 hours No results found for this basename: LIPASE, AMYLASE,  in the last 168 hours No results found for this basename: AMMONIA,  in the last 168 hours CBC:  Recent Labs Lab 04/03/13 2031 04/03/13 2041 04/04/13 0514 04/05/13 0455  WBC 18.4*  --  14.7* 11.3*  NEUTROABS 16.9*  --   --   --   HGB 11.5* 13.3 11.0* 11.3*  HCT 36.3 39.0 35.1* 37.0  MCV 81.0  --  81.1 83.5  PLT 129*  --  116* 131*   Cardiac Enzymes: No results found for this basename: CKTOTAL, CKMB, CKMBINDEX, TROPONINI,  in the last 168 hours BNP: No components found with this basename: POCBNP,  CBG: No results found for this basename: GLUCAP,  in the last 168 hours  Recent Results (from the past 240 hour(s))  URINE CULTURE     Status:  None   Collection Time    04/03/13  8:48 PM      Result Value Range Status   Specimen Description URINE, CATHETERIZED   Final   Special Requests NONE   Final   Culture  Setup Time     Final   Value: 04/04/2013 01:54     Performed at [facility] Count     Final   Value: >=100,000 COLONIES/ML     Performed at [facility]   Culture     Final   Value: ESCHERICHIA COLI     Performed at [facility]   Report Status 04/05/2013 FINAL   Final   Organism ID, Bacteria ESCHERICHIA COLI   Final     Scheduled Meds: . antiseptic oral rinse  15 mL Mouth Rinse BID  . cefTRIAXone (ROCEPHIN)  IV  1 g Intravenous Q24H  . heparin  5,000 Units Subcutaneous Q8H  . tamsulosin  0.4 mg Oral QHS   Continuous Infusions: . sodium chloride 125 mL/hr at 04/05/13 0405     Izaiah Tabb, DO  [facility] Pager [phone]  If 7PM-7AM, please  contact night-coverage [email or web address] Password [email or web address] 04/05/2013, 4:03 PM   LOS: 2 days

## 2013-04-05 NOTE — Progress Notes (Signed)
Urology Progress Note  Subjective:     No acute urologic events overnight. Complains of SP abdominal pain due to the stent.   We discussed the course of the surgery and surgical findings.  ROS: Negative: chest pain or SOB.  Objective:  Patient Vitals for the past 24 hrs:  BP Temp Temp src Pulse Resp SpO2  04/05/13 0447 152/71 mmHg 98.9 F (37.2 C) Oral 84 16 98 %  04/04/13 2120 129/63 mmHg 98.8 F (37.1 C) Oral 82 18 98 %  04/04/13 1529 129/66 mmHg 98.2 F (36.8 C) - 81 20 97 %  04/04/13 1515 124/52 mmHg - - 83 26 98 %  04/04/13 1510 - - - 84 23 97 %  04/04/13 1505 - - - 86 19 96 %  04/04/13 1500 150/68 mmHg 98.2 F (36.8 C) - 77 23 98 %  04/04/13 1455 - - - 80 23 97 %  04/04/13 1450 - - - 79 22 100 %  04/04/13 1445 154/66 mmHg - - 80 21 100 %  04/04/13 1440 - - - 78 23 100 %  04/04/13 1435 - - - 79 20 100 %  04/04/13 1430 - 98.1 F (36.7 C) - - 18 -    Physical Exam: General:  No acute distress, awake Cardiovascular:    [x]   S1/S2 present, RRR  []   Irregularly irregular Chest:  CTA-B Abdomen:               []  Soft, appropriately TTP  [x]  Soft, NTTP  []  Soft, appropriately TTP, incision(s) clean/dry/intact  Genitourinary: Foley draining clear yellow urine.     I/O last 3 completed shifts: In: 1066 [P.O.:240; I.V.:826] Out: 450 [Urine:450]  Recent Labs     04/04/13  0514  04/05/13  0455  HGB  11.0*  11.3*  WBC  14.7*  11.3*  PLT  116*  131*    Recent Labs     04/04/13  0514  04/05/13  0455  NA  131*  137  K  3.9  4.1  CL  95*  102  CO2  25  25  BUN  53*  37*  CREATININE  2.14*  1.44*  CALCIUM  8.7  8.6  GFRNONAA  20*  33*  GFRAA  24*  38*     No results found for this basename: PT, INR, APTT,  in the last 72 hours   No components found with this basename: ABG,     Length of stay: 2 days.  Assessment: Right nephrolithiasis. UTI. Right ureter stent placement 04/04/13   Plan: -Urine culture this morning shows >100K; final  pending. -Patient clinically stable.   - We discussed management of her stone. We discussed shockwave lithotripsy, percutaneous nephrostolithotomy, and ureteroscopy. Shockwave lithotripsy is not an option as the stone was not clearly visible on fluoroscopy. We discussed risks and benefits, side effects, likelihood of achieving goals, and alternatives. I recommend cystoscopy, right ureteroscopy, laser lithotripsy, right retrograde pyelogram, and possible right ureter stent exchange. We discussed the risks which include but are not limited to bleeding, infection, allergic reaction, heart attack, stroke, death, need for ureter dilation, need for repeat procedure, ureter injury, ureter stricture, ureteral avulsion, and damage to contiguous structures.  -I will schedule her to follow up with me as an outpatient to ensure that she is cleared for urinary tract infection and also schedule her for outpatient surgery.  -Discontinue foley (ordered).  The following medications for stent discomfort were ordered; please discharge her  with these as well. -Rx for oxybutynin 5 mg PO q6 hours prn. -Rx for tamsulosin 0.4 mg PO QHS.  Please call w/ questions.  Natalia Leatherwood, MD [phone]

## 2013-04-06 ENCOUNTER — Encounter (HOSPITAL_COMMUNITY): Payer: Self-pay | Admitting: Urology

## 2013-04-06 LAB — CBC
HCT: 38.1 % (ref 36.0–46.0)
MCH: 26 pg (ref 26.0–34.0)
MCHC: 30.4 g/dL (ref 30.0–36.0)
MCV: 85.4 fL (ref 78.0–100.0)
Platelets: 119 10*3/uL — ABNORMAL LOW (ref 150–400)
RBC: 4.46 MIL/uL (ref 3.87–5.11)
RDW: 17.1 % — ABNORMAL HIGH (ref 11.5–15.5)
WBC: 8.6 10*3/uL (ref 4.0–10.5)

## 2013-04-06 LAB — BASIC METABOLIC PANEL
BUN: 22 mg/dL (ref 6–23)
Calcium: 8.6 mg/dL (ref 8.4–10.5)
Chloride: 105 mEq/L (ref 96–112)
Creatinine, Ser: 1.04 mg/dL (ref 0.50–1.10)
GFR calc Af Amer: 57 mL/min — ABNORMAL LOW (ref >90)
GFR calc non Af Amer: 49 mL/min — ABNORMAL LOW (ref >90)
Sodium: 138 mEq/L (ref 135–145)

## 2013-04-06 MED ORDER — CIPROFLOXACIN HCL 500 MG PO TABS
500.0000 mg | ORAL_TABLET | Freq: Two times a day (BID) | ORAL | Status: DC
  Administered 2013-04-06 – 2013-04-07 (×2): 500 mg via ORAL
  Filled 2013-04-06 (×4): qty 1

## 2013-04-06 NOTE — Evaluation (Signed)
Physical Therapy Evaluation Patient Details Name: Laura [name] MRN: [id number] DOB: [birth year] Today's Date: 04/06/2013 Time: 4098-1191 PT Time Calculation (min): 42 min  PT Assessment / Plan / Recommendation History of Present Illness  Laura [name] is a 79 y.o. female who presents to the ED with fever, chills, cough, generalized body aches.  Symptoms onset on Monday and have worsened over the week.  Denies back or flank pain or dysuria.  Cough is productive of whitish sputum.  Patient does report one sick contact with similar symptoms previously.. S/P R ureter stent.  Clinical Impression  Pt reports feeling better after mobilizing to recliner. Pt will benedfit from PT to address problems listed. Recommend SNF.    PT Assessment  Patient needs continued PT services    Follow Up Recommendations  LTAC    Does the patient have the potential to tolerate intense rehabilitation      Barriers to Discharge Decreased caregiver support      Equipment Recommendations  None recommended by PT    Recommendations for Other Services     Frequency Min 3X/week    Precautions / Restrictions Precautions Precautions: Fall Precaution Comments: incontinence   Pertinent Vitals/Pain "all over" received medication, pt then possibly had hallucinations.      Mobility  Bed Mobility Bed Mobility: Supine to Sit Supine to Sit: 2: Max assist;HOB elevated;With rails Details for Bed Mobility Assistance: used bed pad to turn pt to edge of bed. and slide to edge Transfers Transfers: Sit to Stand;Stand to Sit;Stand Pivot Transfers Sit to Stand: 1: +2 Total assist;From bed;From chair/3-in-1 Sit to Stand: Patient Percentage: 50% Stand to Sit: To chair/3-in-1;To bed;1: +2 Total assist Stand to Sit: Patient Percentage: 50% Stand Pivot Transfers: 1: +2 Total assist Stand Pivot Transfers: Patient Percentage: 60% Details for Transfer Assistance: multimodal cues to use UE's to push from bed and  recliner. extra time for activity, decreased time to process instruction. Pt transferred from bed to [facility] then amb x 5 ' Ambulation/Gait Ambulation/Gait Assistance: 1: +2 Total assist Ambulation/Gait: Patient Percentage: 20% Ambulation Distance (Feet): 5 Feet Assistive device: Rolling walker Ambulation/Gait Assistance Details: cues for sequence and  for safety. Gait Pattern: Step-to pattern;Decreased step length - right;Decreased step length - left;Trunk flexed    Exercises     PT Diagnosis: Difficulty walking;Generalized weakness;Acute pain  PT Problem List: Decreased strength;Decreased activity tolerance;Decreased balance;Decreased mobility;Decreased knowledge of precautions;Decreased safety awareness;Decreased knowledge of use of DME PT Treatment Interventions: DME instruction;Gait training;Functional mobility training;Therapeutic activities;Therapeutic exercise;Patient/family education     PT Goals(Current goals can be found in the care plan section) Acute Rehab PT Goals Patient Stated Goal: to get up and walk. PT Goal Formulation: With patient Time For Goal Achievement: 04/20/13 Potential to Achieve Goals: Good  Visit Information  Last PT Received On: 04/06/13 Assistance Needed: +2 History of Present Illness: Laura [name] is a 79 y.o. female who presents to the ED with fever, chills, cough, generalized body aches.  Symptoms onset on Monday and have worsened over the week.  Denies back or flank pain or dysuria.  Cough is productive of whitish sputum.  Patient does report one sick contact with similar symptoms previously.. S/P R ureter stent.       Prior Functioning  Home Living Family/patient expects to be discharged to:: Private residence Available Help at Discharge: Skilled Nursing Facility Type of Home: House Home Access: Stairs to enter [employer] of Steps: 2 Entrance Stairs-Rails: Can reach both Home Layout: One level;Able to  live on main level with  bedroom/bathroom Home Equipment: Walker - 4 wheels;Cane - single point;Bedside commode Prior Function Level of Independence: Independent Communication Communication: No difficulties    Cognition  Cognition Arousal/Alertness: Awake/alert Behavior During Therapy: WFL for tasks assessed/performed Overall Cognitive Status: Impaired/Different from baseline (pt was possibly hallucinating seeing bugs on floor after meds given)    Extremity/Trunk Assessment Upper Extremity Assessment Upper Extremity Assessment: Generalized weakness Lower Extremity Assessment Lower Extremity Assessment: Generalized weakness   Balance Balance Balance Assessed: Yes Static Sitting Balance Static Sitting - Balance Support: Feet supported;Bilateral upper extremity supported Static Sitting - Level of Assistance: 5: Stand by assistance Static Standing Balance Static Standing - Balance Support: Bilateral upper extremity supported Static Standing - Level of Assistance: 2: Max assist Static Standing - Comment/# of Minutes: standing at RW to get washed up after incontinence of urine  End of Session PT - End of Session Equipment Utilized During Treatment: Gait belt Activity Tolerance: Patient tolerated treatment well Patient left: in chair;with call bell/phone within reach Nurse Communication: Mobility status  GP     Rada Hay 04/06/2013, 11:07 AM Blanchard Kelch PT [phone]

## 2013-04-06 NOTE — Progress Notes (Signed)
[facility] PROGRESS NOTE  Laura [name] NWG:[id number] DOB: 1929/06/07 DOA: 04/03/2013 PCP: Ezequiel Kayser, MD  Assessment/Plan: Pyelonephritis  -Urine culture growing Escherichia coli  -d/c ceftriaxone -start cipro 500mg  po bid -Plan to discharge patient on ciprofloxacin 500 mg twice a day x 12 more days -Discontinue Foley cath Right hydronephrosis  -Secondary to right UPJ stone  -appreciate Dr. Margarita Grizzle  -s/p R-ureteral stent 04/04/13  -definitive stone treatment as outpt after infection is treated  -continue tamulosin and oxybutinin -Discontinue IV opioids as the patient was drowsy today. AKI  -improved with IVF  -saline lock IVF today  Hypertension  -Hold ARB due to renal failure  -Restart metoprolol tartrate  -Hold furosemide due to renal failure  Asthma  -Patient is on 2 L nasal cannula at home  -Clinically stable  Deconditioning  -PT evaluation-->SNF  -Husband expressed concern that he may not be able to take care of her  Family Communication: husband at beside  Disposition Plan: SNF          Procedures/Studies: Ct Abdomen Pelvis Wo Contrast  04/04/2013   CLINICAL DATA:  Weakness and body aches  EXAM: CT ABDOMEN AND PELVIS WITHOUT CONTRAST  TECHNIQUE: Multidetector CT imaging of the abdomen and pelvis was performed following the standard protocol without intravenous contrast.  COMPARISON:  02/12/2012  FINDINGS: Atelectasis is noted in both lung bases. The heart size is enlarged. No pleural or pericardial. No focal liver abnormality identified. Sludge and/or stones noted within the dependent portion of gallbladder. No biliary dilatation. The pancreas is unremarkable. Normal appearance of the spleen.  The adrenal glands are both normal. Asymmetric right-sided hydronephrosis noted. Stone at the right UPJ measures 0.5 1.3 cm, image 46/series 5. The left kidney appears within normal limits. The urinary bladder appears normal. The uterus is unremarkable. Cyst  in the left ovary measures 3.5 cm.  There is calcified atherosclerotic disease affecting the abdominal aorta and its branches. No aneurysm. There is no upper abdominal or pelvic adenopathy.  The patient has a small hiatal hernia. The small bowel loops have a normal course and caliber no obstruction. Normal appearance of the proximal colon. Multiple distal colonic diverticula are identified without evidence for obstruction.  Review of the visualized osseous structures is significant for mild lumbar spondylosis.  IMPRESSION: 1. Stone at the right UPJ is identified resulting and asymmetric right-sided hydronephrosis. 2. Atherosclerotic disease. 3. Cyst in the left ovary. This is almost certainly benign, but follow up ultrasound is recommended in 1 year according to the Society of Radiologists in Ultrasound2010 Consensus Conference Statement (D Lenis Noon et al. Management of Asymptomatic Ovarian and Other Adnexal Cysts Imaged at [place]: Society of Radiologists in Ultrasound Consensus Conference Statement 2010. Radiology 256 (Sept 2010): 943-954.).   Electronically Signed   By: Signa Kell M.D.   On: 04/04/2013 00:00   Dg Chest 2 View  04/03/2013   CLINICAL DATA:  Weakness. Body aches. Cough and some shortness of Breath.  EXAM: CHEST  2 VIEW  COMPARISON:  05/22/2011  FINDINGS: Moderate enlargement of the cardiac silhouette. The aorta is uncoiled and tortuous. No mediastinal or hilar masses.  No lung consolidation or edema. No convincing pleural effusion and no pneumothorax. The bony thorax is demineralized but grossly intact.  IMPRESSION: No acute cardiopulmonary disease.   Electronically Signed   By: Amie Portland M.D.   On: 04/03/2013 21:06         Subjective: Patient denies fevers, chills, chest discomfort, shortness of breath, nausea, vomiting, diarrhea, abdominal  pain, dysuria, hematuria.  Objective: Filed Vitals:   04/05/13 2019 04/06/13 0548 04/06/13 1032 04/06/13 1418  BP: 168/69 146/75 151/62  110/53  Pulse: 81 66 75 61  Temp: 98.9 F (37.2 C) 98.5 F (36.9 C)  98.7 F (37.1 C)  TempSrc: Oral Oral  Oral  Resp: 18 19  18   Height:      Weight:      SpO2: 100% 93%  98%    Intake/Output Summary (Last 24 hours) at 04/06/13 1840 Last data filed at 04/06/13 1420  Gross per 24 hour  Intake   1380 ml  Output      0 ml  Net   1380 ml   Weight change:  Exam:   General:  Pt is alert, follows commands appropriately, not in acute distress  HEENT: No icterus, No thrush,[place]/AT  Cardiovascular: RRR, S1/S2, no rubs, no gallops  Respiratory: Scattered bibasilar rales. No wheezing. Good air movement.  Abdomen: Soft/+BS, non tender, non distended, no guarding  Extremities: 1+ edema, No lymphangitis, No petechiae, No rashes, no synovitis  Data Reviewed: Basic Metabolic Panel:  Recent Labs Lab 04/03/13 2031 04/03/13 2041 04/04/13 0514 04/05/13 0455 04/06/13 0440  NA 131* 134* 131* 137 138  K 3.6 3.0* 3.9 4.1 4.3  CL 93* 97 95* 102 105  CO2 26  --  25 25 24   GLUCOSE 111* 107* 79 86 122*  BUN 58* 54* 53* 37* 22  CREATININE 2.41* 2.70* 2.14* 1.44* 1.04  CALCIUM 9.3  --  8.7 8.6 8.6   Liver Function Tests: No results found for this basename: AST, ALT, ALKPHOS, BILITOT, PROT, ALBUMIN,  in the last 168 hours No results found for this basename: LIPASE, AMYLASE,  in the last 168 hours No results found for this basename: AMMONIA,  in the last 168 hours CBC:  Recent Labs Lab 04/03/13 2031 04/03/13 2041 04/04/13 0514 04/05/13 0455 04/06/13 0440  WBC 18.4*  --  14.7* 11.3* 8.6  NEUTROABS 16.9*  --   --   --   --   HGB 11.5* 13.3 11.0* 11.3* 11.6*  HCT 36.3 39.0 35.1* 37.0 38.1  MCV 81.0  --  81.1 83.5 85.4  PLT 129*  --  116* 131* 119*   Cardiac Enzymes: No results found for this basename: CKTOTAL, CKMB, CKMBINDEX, TROPONINI,  in the last 168 hours BNP: No components found with this basename: POCBNP,  CBG: No results found for this basename: GLUCAP,  in the last  168 hours  Recent Results (from the past 240 hour(s))  URINE CULTURE     Status: None   Collection Time    04/03/13  8:48 PM      Result Value Range Status   Specimen Description URINE, CATHETERIZED   Final   Special Requests NONE   Final   Culture  Setup Time     Final   Value: 04/04/2013 01:54     Performed at [facility] Count     Final   Value: >=100,000 COLONIES/ML     Performed at [facility]   Culture     Final   Value: ESCHERICHIA COLI     Performed at [facility]   Report Status 04/05/2013 FINAL   Final   Organism ID, Bacteria ESCHERICHIA COLI   Final     Scheduled Meds: . antiseptic oral rinse  15 mL Mouth Rinse BID  . ciprofloxacin  500 mg Oral BID  . heparin  5,000  Units Subcutaneous Q8H  . metoprolol tartrate  25 mg Oral BID  . tamsulosin  0.4 mg Oral QHS   Continuous Infusions:    Maris Abascal, DO  [facility] Pager [phone]  If 7PM-7AM, please contact night-coverage [email or web address] Password [email or web address] 04/06/2013, 6:40 PM   LOS: 3 days

## 2013-04-06 NOTE — Progress Notes (Signed)
Clinical Social Work Department BRIEF PSYCHOSOCIAL ASSESSMENT 04/06/2013  Patient:  Laura [name], Laura [name]     Account Number:  [id number]     Admit date:  04/03/2013  Clinical Social Worker:  Orpah Greek  Date/Time:  04/06/2013 04:36 PM  Referred by:  Physician  Date Referred:  04/06/2013 Referred for  SNF Placement   Other Referral:   Interview type:  Patient Other interview type:   and husband at bedside    PSYCHOSOCIAL DATA Living Status:  HUSBAND Admitted from facility:   Level of care:   Primary support name:  Laura [name] (husband) h#: [phone] c#: [phone] Primary support relationship to patient:  SPOUSE Degree of support available:   good    CURRENT CONCERNS Current Concerns  Post-Acute Placement   Other Concerns:    SOCIAL WORK ASSESSMENT / PLAN CSW received consult for SNF placement.   Assessment/plan status:  Information/Referral to [facility] Other assessment/ plan:   Information/referral to community resources:   CSW completed FL2 and faxed information out to [facility] - provided bed offers to patient's husband who accepted bed offer at [facility]. CSW spoke with Laura [name] @ [facility] to make aware of possible discharge tomorrow. Laura [name] from [place] to contact the husband to complete admission paperwork prior to discharge.    PATIENT'S/FAMILY'S RESPONSE TO PLAN OF CARE: Patient & husband are agreeable with plan for SNF, patient's husband states that she's not had to go to SNF before - she had been to [facility] but never a SNF. He has been to several facilities visiting family and friends and feels comfortable with her going to [facility].       Laura Bailey, LCSW [facility] Clinical Social Worker cell #: [phone]

## 2013-04-06 NOTE — Progress Notes (Addendum)
   Clinical Social Work Department CLINICAL SOCIAL WORK PLACEMENT NOTE 04/06/2013  Patient:  Laura [name], Laura [name]  Account Number:  [id number] Admit date:  04/03/2013  Clinical Social Worker:  Orpah Greek  Date/time:  04/06/2013 04:41 PM  Clinical Social Work is seeking post-discharge placement for this patient at the following level of care:   SKILLED NURSING   (*CSW will update this form in Epic as items are completed)   04/06/2013  Patient/family provided with [facility] Department of Clinical Social Work's list of facilities offering this level of care within the geographic area requested by the patient (or if unable, by the patient's family).  04/06/2013  Patient/family informed of their freedom to choose among providers that offer the needed level of care, that participate in Medicare, Medicaid or managed care program needed by the patient, have an available bed and are willing to accept the patient.  04/06/2013  Patient/family informed of [facility]' ownership interest in [facility], as well as of the fact that they are under no obligation to receive care at this facility.  PASARR submitted to EDS on 04/06/2013 PASARR number received from EDS on 04/06/2013  FL2 transmitted to all facilities in geographic area requested by pt/family on  04/06/2013 FL2 transmitted to all facilities within larger geographic area on   Patient informed that his/her managed care company has contracts with or will negotiate with  certain facilities, including the following:     Patient/family informed of bed offers received:  04/06/2013 Patient chooses bed at [facility] Physician recommends and patient chooses bed at    Patient to be transferred to [facility] on  04/07/2013 Patient to be transferred to facility by ambulance Sharin Mons)  The following physician request were entered in Epic:   Additional Comments:   Unice Bailey, LCSW [facility] Clinical  Social Worker cell #: [phone]  Jacklynn Lewis, MSW, LCSW Clinical Social Work [phone]

## 2013-04-06 NOTE — Care Management Note (Addendum)
Cm spoke with patient and spouse at bedside concerning discharge planning.Per pt and spouse, spouse unable to care for pt independently at home. PT recommendation for SNF vs LTACH. Per MD ABX switching to orals, Pt inappropriate for LTACH once on oral ABX. Pt agreeable to CSW to follow for further disposition to SNF.     Roxy Manns Jamion Carter,MSN,RN [phone]

## 2013-04-07 LAB — BASIC METABOLIC PANEL
CO2: 28 mEq/L (ref 19–32)
Calcium: 8.8 mg/dL (ref 8.4–10.5)
GFR calc Af Amer: 68 mL/min — ABNORMAL LOW (ref >90)
GFR calc non Af Amer: 58 mL/min — ABNORMAL LOW (ref >90)
Potassium: 4.2 mEq/L (ref 3.7–5.3)
Sodium: 139 mEq/L (ref 137–147)

## 2013-04-07 MED ORDER — OXYBUTYNIN CHLORIDE 5 MG PO TABS: 5.0000 mg | ORAL_TABLET | Freq: Four times a day (QID) | ORAL | Status: DC | PRN

## 2013-04-07 MED ORDER — METOPROLOL TARTRATE 50 MG PO TABS: 100.0000 mg | ORAL_TABLET | Freq: Two times a day (BID) | ORAL | Status: DC

## 2013-04-07 MED ORDER — METOPROLOL TARTRATE 50 MG PO TABS
50.0000 mg | ORAL_TABLET | Freq: Two times a day (BID) | ORAL | Status: DC
  Filled 2013-04-07: qty 1

## 2013-04-07 MED ORDER — METOPROLOL TARTRATE 50 MG PO TABS: 50.0000 mg | ORAL_TABLET | Freq: Two times a day (BID) | ORAL | Status: DC

## 2013-04-07 MED ORDER — METOPROLOL TARTRATE 100 MG PO TABS: 100.0000 mg | ORAL_TABLET | Freq: Two times a day (BID) | ORAL | Status: DC

## 2013-04-07 MED ORDER — CIPROFLOXACIN HCL 500 MG PO TABS: 500.0000 mg | ORAL_TABLET | Freq: Two times a day (BID) | ORAL | Status: DC

## 2013-04-07 MED ORDER — TAMSULOSIN HCL 0.4 MG PO CAPS: 0.4000 mg | ORAL_CAPSULE | Freq: Every day | ORAL | Status: DC

## 2013-04-07 NOTE — Progress Notes (Signed)
Pt for discharge to [facility].  CSW facilitated pt discharge needs including contacting facility, faxing pt discharge information via [facility], discussing with pt and pt family at bedside, providing RN phone number to call report, and arranging ambulance transport for pt to [facility] (Service Request ID#: [id number]).  No further social work needs identified at this time.  CSW signing off.   Jacklynn Lewis, MSW, LCSW Clinical Social Work [phone]

## 2013-04-07 NOTE — Progress Notes (Signed)
Pt. Was discharged to [facility]. Report was called to staff.  Discharge packet was sent with transportation at discharge.

## 2013-04-07 NOTE — Discharge Summary (Signed)
Physician Discharge Summary  Laura [name]:[id number] DOB: 12-29-1929 DOA: 04/03/2013  PCP: Ezequiel Kayser, MD  Admit date: 04/03/2013 Discharge date: 04/07/2013  Recommendations for Outpatient Follow-up:  1. Pt will need to follow up with PCP in 2 weeks post discharge 2. Please obtain BMP to evaluate electrolytes and kidney function 3. Please also check CBC to evaluate Hg and Hct levels 4. F/u Dr. Margarita Grizzle in 3 weeks   Discharge Diagnoses:  Active Problems:   UTI (urinary tract infection)   Influenza-like illness   Nephrolithiasis   Complicated UTI (urinary tract infection)   AKI (acute kidney injury) Pyelonephritis  -Urine culture growing Escherichia coli  -d/c ceftriaxone  -start cipro 500mg  po bid  -Plan to discharge patient on ciprofloxacin 500 mg twice a day x 12 more days  -Discontinue Foley cath  Right hydronephrosis  -Secondary to right UPJ stone  -appreciate Dr. Margarita Grizzle  -s/p R-ureteral stent 04/04/13  -definitive stone treatment as outpt after infection is treated  -continue tamulosin and oxybutinin  -Discontinue IV opioids as the patient was very drowsy. -Will use acetaminophen for pain  AKI  -improved with IVF  -saline lock IVF today  -Serum creatinine 0.90 on the day of discharge Hypertension  -Hold ARB due to renal failure  -Patient's metoprolol tartrate was restarted at her usual home dose, 100 mg twice a day -The patient will be restarted on furosemide at her home dose -The patient was instructed to followup with her primary care physician before restarting her ARB Asthma  -Patient is on 2 L nasal cannula at home  -Clinically stable  Deconditioning  -PT evaluation-->SNF  -Husband expressed concern that he may not be able to take care of her  Family Communication: husband at beside  Disposition Plan: SNF   Discharge Condition: Stable  Disposition:  skilled nursing facility  Diet: Heart healthy Wt Readings from Last 3 Encounters:   04/04/13 90.719 kg (200 lb)  04/04/13 90.719 kg (200 lb)  09/30/12 88.451 kg (195 lb)    History of present illness:  79 year old female with a history of coronary artery disease, hypertension, asthma on home oxygen presented to emergency department with fevers, chills, generalized body aches. She also complained of bilateral flank pain. CT of the abdomen and pelvis in the ED showed a right hydronephrosis with a right UPJ stone. Urology was consulted. Dr. Margarita Grizzle saw the patient and the patient to surgery during which time a right ureteral stent was placed on 04/04/2013. Urine cultures grew Escherichia coli. The patient was initially on ceftriaxone. This was changed to oral ciprofloxacin. The patient was noted to be in acute renal failure on the day of admission with a serum creatinine 2.70. She was hydrated with IV fluids. Her serum creatinine improved. The patient's diet was advanced and she tolerated it without difficulty. Physical therapy was consulted. The patient was deconditioned. They recommended skilled nursing facility. With assistance of social work, the patient was transitioned to [facility].    Consultants: Urology--Dr. Margarita Grizzle  Discharge Exam: Filed Vitals:   04/07/13 0623  BP: 161/77  Pulse: 63  Temp: 97.4 F (36.3 C)  Resp: 18   Filed Vitals:   04/06/13 1032 04/06/13 1418 04/06/13 2122 04/07/13 0623  BP: 151/62 110/53 157/82 161/77  Pulse: 75 61 72 63  Temp:  98.7 F (37.1 C) 98.9 F (37.2 C) 97.4 F (36.3 C)  TempSrc:  Oral Oral Oral  Resp:  18 16 18   Height:      Weight:  SpO2:  98% 98% 100%   General: A&O x 3, NAD, pleasant, cooperative Cardiovascular: RRR, no rub, no gallop, no S3 Respiratory: Scattered basilar rales. No wheezing. Good air movement. Abdomen:soft, nontender, nondistended, positive bowel sounds Extremities: 1+ LEedema, No lymphangitis, no petechiae  Discharge Instructions      Discharge Orders   Future Appointments Provider  Department Dept Phone   10/06/2013 9:30 AM [facility] [place] [facility] AT [facility] [phone]   Liquids only 4 hours prior to your exam. Any medications can be taken as usual. Please arrive 15 min prior to your scheduled exam time.   10/06/2013 11:15 AM Larina Earthly, MD [facility] [phone]   Future Orders Complete By Expires   Diet - low sodium heart healthy  As directed    Increase activity slowly  As directed        Medication List    STOP taking these medications       losartan 50 MG tablet  Commonly known as:  COZAAR     valsartan 160 MG tablet  Commonly known as:  DIOVAN      TAKE these medications       acetaminophen 325 MG tablet  Commonly known as:  TYLENOL  Take 650 mg by mouth every 6 (six) hours as needed for mild pain or moderate pain.     albuterol 108 (90 BASE) MCG/ACT inhaler  Commonly known as:  PROVENTIL HFA;VENTOLIN HFA  Inhale 2 puffs into the lungs every 4 (four) hours as needed. For cough or wheezing     aspirin 81 MG tablet  Take 160 mg by mouth daily.     ciprofloxacin 500 MG tablet  Commonly known as:  CIPRO  Take 1 tablet (500 mg total) by mouth 2 (two) times daily.     DUREZOL 0.05 % Emul  Generic drug:  Difluprednate  Place 1 drop into the right eye 2 (two) times daily.     furosemide 40 MG tablet  Commonly known as:  LASIX  Take 40 mg by mouth.     HYDROcodone-homatropine 5-1.5 MG/5ML syrup  Commonly known as:  HYCODAN  Take 5 mLs by mouth every 6 (six) hours as needed for cough.     isosorbide mononitrate 30 MG 24 hr tablet  Commonly known as:  IMDUR  Take 30 mg by mouth daily.     metoprolol 100 MG tablet  Commonly known as:  LOPRESSOR  Take 1 tablet (100 mg total) by mouth 2 (two) times daily.     montelukast 10 MG tablet  Commonly known as:  SINGULAIR  Take 10 mg by mouth at bedtime.     MYRBETRIQ 25 MG Tb24 tablet  Generic drug:  mirabegron ER  Take 25 mg by mouth  daily.     ondansetron 8 MG disintegrating tablet  Commonly known as:  ZOFRAN-ODT  Take 8 mg by mouth every 8 (eight) hours as needed for nausea.     oxybutynin 5 MG tablet  Commonly known as:  DITROPAN  Take 1 tablet (5 mg total) by mouth every 6 (six) hours as needed for bladder spasms.     rosuvastatin 20 MG tablet  Commonly known as:  CRESTOR  Take 20 mg by mouth daily.     sertraline 50 MG tablet  Commonly known as:  ZOLOFT  Take 50 mg by mouth daily.     tamsulosin 0.4 MG Caps capsule  Commonly known as:  FLOMAX  Take 1 capsule (0.4 mg total) by mouth at bedtime.     Vitamin D (Ergocalciferol) 50000 UNITS Caps capsule  Commonly known as:  DRISDOL  Take 50,000 Units by mouth every 7 (seven) days.         The results of significant diagnostics from this hospitalization (including imaging, microbiology, ancillary and laboratory) are listed below for reference.    Significant Diagnostic Studies: Ct Abdomen Pelvis Wo Contrast  04/04/2013   CLINICAL DATA:  Weakness and body aches  EXAM: CT ABDOMEN AND PELVIS WITHOUT CONTRAST  TECHNIQUE: Multidetector CT imaging of the abdomen and pelvis was performed following the standard protocol without intravenous contrast.  COMPARISON:  02/12/2012  FINDINGS: Atelectasis is noted in both lung bases. The heart size is enlarged. No pleural or pericardial. No focal liver abnormality identified. Sludge and/or stones noted within the dependent portion of gallbladder. No biliary dilatation. The pancreas is unremarkable. Normal appearance of the spleen.  The adrenal glands are both normal. Asymmetric right-sided hydronephrosis noted. Stone at the right UPJ measures 0.5 1.3 cm, image 46/series 5. The left kidney appears within normal limits. The urinary bladder appears normal. The uterus is unremarkable. Cyst in the left ovary measures 3.5 cm.  There is calcified atherosclerotic disease affecting the abdominal aorta and its branches. No aneurysm. There  is no upper abdominal or pelvic adenopathy.  The patient has a small hiatal hernia. The small bowel loops have a normal course and caliber no obstruction. Normal appearance of the proximal colon. Multiple distal colonic diverticula are identified without evidence for obstruction.  Review of the visualized osseous structures is significant for mild lumbar spondylosis.  IMPRESSION: 1. Stone at the right UPJ is identified resulting and asymmetric right-sided hydronephrosis. 2. Atherosclerotic disease. 3. Cyst in the left ovary. This is almost certainly benign, but follow up ultrasound is recommended in 1 year according to the Society of Radiologists in Ultrasound2010 Consensus Conference Statement (D Lenis Noon et al. Management of Asymptomatic Ovarian and Other Adnexal Cysts Imaged at [place]: Society of Radiologists in Ultrasound Consensus Conference Statement 2010. Radiology 256 (Sept 2010): 943-954.).   Electronically Signed   By: Signa Kell M.D.   On: 04/04/2013 00:00   Dg Chest 2 View  04/03/2013   CLINICAL DATA:  Weakness. Body aches. Cough and some shortness of Breath.  EXAM: CHEST  2 VIEW  COMPARISON:  05/22/2011  FINDINGS: Moderate enlargement of the cardiac silhouette. The aorta is uncoiled and tortuous. No mediastinal or hilar masses.  No lung consolidation or edema. No convincing pleural effusion and no pneumothorax. The bony thorax is demineralized but grossly intact.  IMPRESSION: No acute cardiopulmonary disease.   Electronically Signed   By: Amie Portland M.D.   On: 04/03/2013 21:06     Microbiology: Recent Results (from the past 240 hour(s))  URINE CULTURE     Status: None   Collection Time    04/03/13  8:48 PM      Result Value Range Status   Specimen Description URINE, CATHETERIZED   Final   Special Requests NONE   Final   Culture  Setup Time     Final   Value: 04/04/2013 01:54     Performed at [facility] Count     Final   Value: >=100,000 COLONIES/ML     Performed  at [facility]   Culture     Final   Value: ESCHERICHIA COLI     Performed at [facility]  Report Status 04/05/2013 FINAL   Final   Organism ID, Bacteria ESCHERICHIA COLI   Final     Labs: Basic Metabolic Panel:  Recent Labs Lab 04/03/13 2031 04/03/13 2041 04/04/13 0514 04/05/13 0455 04/06/13 0440 04/07/13 0432  NA 131* 134* 131* 137 138 139  K 3.6 3.0* 3.9 4.1 4.3 4.2  CL 93* 97 95* 102 105 103  CO2 26  --  25 25 24 28   GLUCOSE 111* 107* 79 86 122* 107*  BUN 58* 54* 53* 37* 22 16  CREATININE 2.41* 2.70* 2.14* 1.44* 1.04 0.90  CALCIUM 9.3  --  8.7 8.6 8.6 8.8   Liver Function Tests: No results found for this basename: AST, ALT, ALKPHOS, BILITOT, PROT, ALBUMIN,  in the last 168 hours No results found for this basename: LIPASE, AMYLASE,  in the last 168 hours No results found for this basename: AMMONIA,  in the last 168 hours CBC:  Recent Labs Lab 04/03/13 2031 04/03/13 2041 04/04/13 0514 04/05/13 0455 04/06/13 0440  WBC 18.4*  --  14.7* 11.3* 8.6  NEUTROABS 16.9*  --   --   --   --   HGB 11.5* 13.3 11.0* 11.3* 11.6*  HCT 36.3 39.0 35.1* 37.0 38.1  MCV 81.0  --  81.1 83.5 85.4  PLT 129*  --  116* 131* 119*   Cardiac Enzymes: No results found for this basename: CKTOTAL, CKMB, CKMBINDEX, TROPONINI,  in the last 168 hours BNP: No components found with this basename: POCBNP,  CBG: No results found for this basename: GLUCAP,  in the last 168 hours  Time coordinating discharge:  Greater than 30 minutes  Signed:  Hatsuko Bizzarro, DO [facility] Pager: [phone] 04/07/2013, 11:02 AM

## 2013-04-10 ENCOUNTER — Non-Acute Institutional Stay (SKILLED_NURSING_FACILITY): Payer: Medicare Other | Admitting: Internal Medicine

## 2013-04-10 DIAGNOSIS — N390 Urinary tract infection, site not specified: Secondary | ICD-10-CM

## 2013-04-10 DIAGNOSIS — N200 Calculus of kidney: Secondary | ICD-10-CM

## 2013-04-10 DIAGNOSIS — J45909 Unspecified asthma, uncomplicated: Secondary | ICD-10-CM | POA: Insufficient documentation

## 2013-04-10 DIAGNOSIS — I2510 Atherosclerotic heart disease of native coronary artery without angina pectoris: Secondary | ICD-10-CM

## 2013-04-10 NOTE — Progress Notes (Signed)
HISTORY & PHYSICAL  DATE: 04/10/2013   FACILITY: [facility]  LEVEL OF CARE: SNF (31)  ALLERGIES:  Allergies  Allergen Reactions  . Codeine Nausea And Vomiting  . Hydrocodone Other (See Comments)    delusions    CHIEF COMPLAINT:  Manage UTI, CAD and asthma  HISTORY OF PRESENT ILLNESS: Patient is an 80 year old Caucasian female who was hospitalized secondary to fever, chills, generalized body aches and bilateral flank pain. After hospitalization she is admitted to this facility for short-term rehabilitation.  UTI: The UTI remains stable.  The patient denies ongoing suprapubic pain, flank pain, dysuria, urinary frequency, urinary hesitancy or hematuria.  No complications reported from the current antibiotic being used. She is currently on Cipro. Urine culture grew Escherichia coli.  ASTHMA: The patient's asthma remains stable. Patient denies shortness of breath, dyspnea on exertion or wheezing. No complications reported from the medications currently being used.CAD: The angina has been stable. The patient denies dyspnea on exertion, orthopnea, pedal edema, palpitations and paroxysmal nocturnal dyspnea. No complications noted from the medication presently being used.  PAST MEDICAL HISTORY :  Past Medical History  Diagnosis Date  . Coronary artery disease   . Myocardial infarction   . Hypertension   . Arthritis   . Asthma   . Chronic bronchitis     PAST SURGICAL HISTORY: Past Surgical History  Procedure Laterality Date  . Coronary stent placement    . Appendectomy    . Tonsillectomy    . Knee surgery    . Cystoscopy w/ ureteral stent placement Right 04/04/2013    Procedure: CYSTOSCOPY WITH RETROGRADE PYELOGRAM/URETERAL STENT PLACEMENT;  Surgeon: Molli Hazard, MD;  Location: [facility];  Service: Urology;  Laterality: Right;    SOCIAL HISTORY:  reports that she has never smoked. She has never used smokeless tobacco. She reports that she does not  drink alcohol or use illicit drugs.  FAMILY HISTORY:  Family History  Problem Relation Age of Onset  . Adopted: Yes    CURRENT MEDICATIONS: Reviewed per MAR  REVIEW OF SYSTEMS:  See HPI otherwise 14 point ROS is negative.  PHYSICAL EXAMINATION  VS:  T 97.8       P 56       RR 20       BP 137/76       POX% 96        GENERAL: no acute distress, moderately obese body habitus EYES: conjunctivae normal, sclerae normal, normal eye lids MOUTH/THROAT: lips without lesions,no lesions in the mouth,tongue is without lesions,uvula elevates in midline NECK: supple, trachea midline, no neck masses, no thyroid tenderness, no thyromegaly LYMPHATICS: no LAN in the neck, no supraclavicular LAN RESPIRATORY: breathing is even & unlabored, BS CTAB CARDIAC: RRR, no murmur,no extra heart sounds, no edema GI:  ABDOMEN: abdomen soft, normal BS, no masses, no tenderness  LIVER/SPLEEN: no hepatomegaly, no splenomegaly MUSCULOSKELETAL: HEAD: normal to inspection & palpation BACK: no kyphosis, scoliosis or spinal processes tenderness EXTREMITIES: LEFT UPPER EXTREMITY: full range of motion, normal strength & tone RIGHT UPPER EXTREMITY:  full range of motion, normal strength & tone LEFT LOWER EXTREMITY:  Moderate range of motion, normal strength & tone RIGHT LOWER EXTREMITY:  Moderate range of motion, normal strength & tone PSYCHIATRIC: the patient is alert & oriented to person, affect & behavior appropriate  LABS/RADIOLOGY:  Labs reviewed: Basic Metabolic Panel:  Recent Labs  04/05/13 0455 04/06/13 0440 04/07/13 0432  NA 137 138 139  K 4.1 4.3  4.2  CL 102 105 103  CO2 25 24 28   GLUCOSE 86 122* 107*  BUN 37* 22 16  CREATININE 1.44* 1.04 0.90  CALCIUM 8.6 8.6 8.8    CBC:  Recent Labs  04/03/13 2031  04/04/13 0514 04/05/13 0455 04/06/13 0440  WBC 18.4*  --  14.7* 11.3* 8.6  NEUTROABS 16.9*  --   --   --   --   HGB 11.5*  < > 11.0* 11.3* 11.6*  HCT 36.3  < > 35.1* 37.0 38.1  MCV  81.0  --  81.1 83.5 85.4  PLT 129*  --  116* 131* 119*  < > = values in this interval not displayed.  EXAM: CHEST  2 VIEW   COMPARISON:  05/22/2011   FINDINGS: Moderate enlargement of the cardiac silhouette. The aorta is uncoiled and tortuous. No mediastinal or hilar masses.   No lung consolidation or edema. No convincing pleural effusion and no pneumothorax. The bony thorax is demineralized but grossly intact.   IMPRESSION: No acute cardiopulmonary disease.   EXAM: CT ABDOMEN AND PELVIS WITHOUT CONTRAST   TECHNIQUE: Multidetector CT imaging of the abdomen and pelvis was performed following the standard protocol without intravenous contrast.   COMPARISON:  02/12/2012   FINDINGS: Atelectasis is noted in both lung bases. The heart size is enlarged. No pleural or pericardial. No focal liver abnormality identified. Sludge and/or stones noted within the dependent portion of gallbladder. No biliary dilatation. The pancreas is unremarkable. Normal appearance of the spleen.   The adrenal glands are both normal. Asymmetric right-sided hydronephrosis noted. Stone at the right UPJ measures 0.5 1.3 cm, image 46/series 5. The left kidney appears within normal limits. The urinary bladder appears normal. The uterus is unremarkable. Cyst in the left ovary measures 3.5 cm.   There is calcified atherosclerotic disease affecting the abdominal aorta and its branches. No aneurysm. There is no upper abdominal or pelvic adenopathy.   The patient has a small hiatal hernia. The small bowel loops have a normal course and caliber no obstruction. Normal appearance of the proximal colon. Multiple distal colonic diverticula are identified without evidence for obstruction.   Review of the visualized osseous structures is significant for mild lumbar spondylosis.   IMPRESSION: 1. Stone at the right UPJ is identified resulting and asymmetric right-sided hydronephrosis. 2. Atherosclerotic  disease. 3. Cyst in the left ovary. This is almost certainly benign, but follow up ultrasound is recommended in 1 year according to the Society of Radiologists in [place] Statement (D Clovis Riley et al. Management of Asymptomatic Ovarian and Other Adnexal Cysts Imaged at [place]: Society of Radiologists in [place] Statement 2010. Radiology 256 (Sept 2010): 161-096.).   ASSESSMENT/PLAN:  UTI-continue Cipro CAD-stable Asthma-well compensated Right UPJ stone-status post ureteral stent. Hypertension-well controlled  Urinary retention-continue current medications Check CBC and BMP  I spoke with patient's husband at bedside.  I have reviewed patient's medical records received at admission/from hospitalization.  CPT CODE: 04540

## 2013-04-16 ENCOUNTER — Other Ambulatory Visit: Payer: Self-pay | Admitting: Urology

## 2013-04-16 NOTE — Progress Notes (Signed)
Surgery scheduled for 04/30/13.  Need orders in EPIC.  Thank You.  

## 2013-04-20 ENCOUNTER — Encounter (HOSPITAL_COMMUNITY): Payer: Self-pay | Admitting: *Deleted

## 2013-04-20 ENCOUNTER — Encounter (HOSPITAL_COMMUNITY): Payer: Self-pay | Admitting: Pharmacy Technician

## 2013-04-20 NOTE — Progress Notes (Signed)
Requested and received current MAR, and medical history from [facility].  Copy of MAR given to pharmacy tech.

## 2013-04-21 ENCOUNTER — Encounter (HOSPITAL_COMMUNITY): Payer: Self-pay | Admitting: *Deleted

## 2013-04-21 NOTE — Progress Notes (Signed)
Patient uses oxygen at 2L/[place].

## 2013-04-22 NOTE — Progress Notes (Signed)
Faxed to [facility] preop instructions for surgery.  Received by [facility].  Nurse voices understanding.

## 2013-04-29 NOTE — Progress Notes (Signed)
Spoke to [employer] at [facility] ([place]) regarding her pre-op medications.  I instructed the importance of pt receiving her lopressor and imdur prior to coming to [place] short stay.  Kim verbalized she changed time on [facility] for pt to receive her medications at 5am day of surgery.

## 2013-04-30 ENCOUNTER — Other Ambulatory Visit: Payer: Self-pay | Admitting: *Deleted

## 2013-04-30 ENCOUNTER — Encounter (HOSPITAL_COMMUNITY): Payer: Self-pay

## 2013-04-30 ENCOUNTER — Encounter (HOSPITAL_COMMUNITY): Payer: Medicare Other | Admitting: Certified Registered Nurse Anesthetist

## 2013-04-30 ENCOUNTER — Ambulatory Visit (HOSPITAL_COMMUNITY): Payer: Medicare Other | Admitting: Certified Registered Nurse Anesthetist

## 2013-04-30 ENCOUNTER — Encounter (HOSPITAL_COMMUNITY)
Admission: RE | Disposition: A | Discharge status: Home / Self Care | Payer: Self-pay | Source: Ambulatory Visit | Attending: Urology

## 2013-04-30 ENCOUNTER — Ambulatory Visit (HOSPITAL_COMMUNITY)
Admission: RE | Admit: 2013-04-30 | Discharge: 2013-04-30 | Disposition: A | Discharge status: Home / Self Care | Payer: Medicare Other | Source: Ambulatory Visit | Attending: Urology | Admitting: Urology

## 2013-04-30 DIAGNOSIS — Z79899 Other long term (current) drug therapy: Secondary | ICD-10-CM | POA: Insufficient documentation

## 2013-04-30 DIAGNOSIS — I739 Peripheral vascular disease, unspecified: Secondary | ICD-10-CM | POA: Insufficient documentation

## 2013-04-30 DIAGNOSIS — J4489 Other specified chronic obstructive pulmonary disease: Secondary | ICD-10-CM | POA: Insufficient documentation

## 2013-04-30 DIAGNOSIS — N200 Calculus of kidney: Secondary | ICD-10-CM

## 2013-04-30 DIAGNOSIS — J449 Chronic obstructive pulmonary disease, unspecified: Secondary | ICD-10-CM | POA: Insufficient documentation

## 2013-04-30 DIAGNOSIS — I10 Essential (primary) hypertension: Secondary | ICD-10-CM | POA: Insufficient documentation

## 2013-04-30 DIAGNOSIS — I2510 Atherosclerotic heart disease of native coronary artery without angina pectoris: Secondary | ICD-10-CM | POA: Insufficient documentation

## 2013-04-30 DIAGNOSIS — I252 Old myocardial infarction: Secondary | ICD-10-CM | POA: Insufficient documentation

## 2013-04-30 DIAGNOSIS — Z9981 Dependence on supplemental oxygen: Secondary | ICD-10-CM | POA: Insufficient documentation

## 2013-04-30 HISTORY — PX: CYSTOSCOPY WITH RETROGRADE PYELOGRAM, URETEROSCOPY AND STENT PLACEMENT: SHX5789

## 2013-04-30 HISTORY — DX: Nausea with vomiting, unspecified: Z98.890

## 2013-04-30 HISTORY — DX: Other specified postprocedural states: R11.2

## 2013-04-30 HISTORY — DX: Unspecified hydronephrosis: N13.30

## 2013-04-30 HISTORY — DX: Calculus of kidney: N20.0

## 2013-04-30 HISTORY — DX: Other complications of anesthesia, initial encounter: T88.59XA

## 2013-04-30 HISTORY — DX: Adverse effect of unspecified anesthetic, initial encounter: T41.45XA

## 2013-04-30 HISTORY — DX: Urinary tract infection, site not specified: N39.0

## 2013-04-30 HISTORY — PX: HOLMIUM LASER APPLICATION: SHX5852

## 2013-04-30 SURGERY — CYSTOURETEROSCOPY, WITH RETROGRADE PYELOGRAM AND STENT INSERTION
Anesthesia: General | Laterality: Right

## 2013-04-30 MED ORDER — FENTANYL CITRATE 0.05 MG/ML IJ SOLN
INTRAMUSCULAR | Status: AC
  Filled 2013-04-30: qty 2

## 2013-04-30 MED ORDER — ALBUTEROL SULFATE (2.5 MG/3ML) 0.083% IN NEBU
INHALATION_SOLUTION | RESPIRATORY_TRACT | Status: AC
  Filled 2013-04-30: qty 3

## 2013-04-30 MED ORDER — CEFAZOLIN SODIUM-DEXTROSE 2-3 GM-% IV SOLR
2.0000 g | INTRAVENOUS | Status: AC
  Administered 2013-04-30: 2 g via INTRAVENOUS

## 2013-04-30 MED ORDER — EPHEDRINE SULFATE 50 MG/ML IJ SOLN
INTRAMUSCULAR | Status: DC | PRN
  Administered 2013-04-30: 10 mg via INTRAVENOUS

## 2013-04-30 MED ORDER — SODIUM CHLORIDE 0.9 % IR SOLN
Status: DC | PRN
  Administered 2013-04-30: 3000 mL

## 2013-04-30 MED ORDER — SENNOSIDES-DOCUSATE SODIUM 8.6-50 MG PO TABS: 1.0000 | ORAL_TABLET | Freq: Two times a day (BID) | ORAL | Status: DC

## 2013-04-30 MED ORDER — ALBUTEROL SULFATE (2.5 MG/3ML) 0.083% IN NEBU
2.5000 mg | INHALATION_SOLUTION | Freq: Once | RESPIRATORY_TRACT | Status: AC
  Administered 2013-04-30: 3 mL via RESPIRATORY_TRACT

## 2013-04-30 MED ORDER — IOHEXOL 350 MG/ML SOLN: INTRAVENOUS | Status: DC | PRN

## 2013-04-30 MED ORDER — PROPOFOL 10 MG/ML IV BOLUS
INTRAVENOUS | Status: DC | PRN
  Administered 2013-04-30: 140 mg via INTRAVENOUS

## 2013-04-30 MED ORDER — LIDOCAINE HCL (CARDIAC) 20 MG/ML IV SOLN
INTRAVENOUS | Status: DC | PRN
  Administered 2013-04-30: 50 mg via INTRAVENOUS

## 2013-04-30 MED ORDER — CIPROFLOXACIN HCL 500 MG PO TABS: 500.0000 mg | ORAL_TABLET | Freq: Two times a day (BID) | ORAL | Status: DC

## 2013-04-30 MED ORDER — MEPERIDINE HCL 50 MG/ML IJ SOLN: 6.2500 mg | INTRAMUSCULAR | Status: DC | PRN

## 2013-04-30 MED ORDER — FENTANYL CITRATE 0.05 MG/ML IJ SOLN: 25.0000 ug | INTRAMUSCULAR | Status: DC | PRN

## 2013-04-30 MED ORDER — HYOSCYAMINE SULFATE 0.125 MG PO TABS
0.1250 mg | ORAL_TABLET | ORAL | Status: DC | PRN
Start: 2013-04-30 — End: 2013-07-31

## 2013-04-30 MED ORDER — OXYCODONE-ACETAMINOPHEN 5-325 MG PO TABS: 1.0000 | ORAL_TABLET | ORAL | Status: DC | PRN

## 2013-04-30 MED ORDER — ONDANSETRON HCL 4 MG/2ML IJ SOLN
INTRAMUSCULAR | Status: AC
  Filled 2013-04-30: qty 2

## 2013-04-30 MED ORDER — LIDOCAINE HCL 2 % EX GEL
CUTANEOUS | Status: DC | PRN
  Administered 2013-04-30: 1 via URETHRAL

## 2013-04-30 MED ORDER — BELLADONNA ALKALOIDS-OPIUM 16.2-60 MG RE SUPP
RECTAL | Status: AC
  Filled 2013-04-30: qty 1

## 2013-04-30 MED ORDER — LIDOCAINE HCL 2 % EX GEL
CUTANEOUS | Status: AC
  Filled 2013-04-30: qty 10

## 2013-04-30 MED ORDER — LACTATED RINGERS IV SOLN
INTRAVENOUS | Status: DC
  Administered 2013-04-30: 09:00:00 via INTRAVENOUS

## 2013-04-30 MED ORDER — CEFAZOLIN SODIUM-DEXTROSE 2-3 GM-% IV SOLR
INTRAVENOUS | Status: AC
  Filled 2013-04-30: qty 50

## 2013-04-30 MED ORDER — BELLADONNA ALKALOIDS-OPIUM 16.2-60 MG RE SUPP
RECTAL | Status: DC | PRN
  Administered 2013-04-30: 1 via RECTAL

## 2013-04-30 MED ORDER — ONDANSETRON HCL 4 MG/2ML IJ SOLN
INTRAMUSCULAR | Status: DC | PRN
  Administered 2013-04-30: 4 mg via INTRAVENOUS

## 2013-04-30 MED ORDER — SODIUM CHLORIDE 0.9 % IJ SOLN
INTRAMUSCULAR | Status: AC
  Filled 2013-04-30: qty 10

## 2013-04-30 MED ORDER — EPHEDRINE SULFATE 50 MG/ML IJ SOLN
INTRAMUSCULAR | Status: AC
  Filled 2013-04-30: qty 1

## 2013-04-30 MED ORDER — PROMETHAZINE HCL 25 MG/ML IJ SOLN: 6.2500 mg | INTRAMUSCULAR | Status: DC | PRN

## 2013-04-30 MED ORDER — PROPOFOL 10 MG/ML IV BOLUS
INTRAVENOUS | Status: AC
  Filled 2013-04-30: qty 20

## 2013-04-30 MED ORDER — PHENAZOPYRIDINE HCL 100 MG PO TABS: 100.0000 mg | ORAL_TABLET | Freq: Three times a day (TID) | ORAL | Status: DC | PRN

## 2013-04-30 MED ORDER — FENTANYL CITRATE 0.05 MG/ML IJ SOLN
INTRAMUSCULAR | Status: DC | PRN
  Administered 2013-04-30 (×2): 25 ug via INTRAVENOUS

## 2013-04-30 SURGICAL SUPPLY — 31 items
BAG URO CATCHER STRL LF (DRAPE) ×2 IMPLANT
BASKET LASER NITINOL 1.9FR (BASKET) IMPLANT
BASKET STNLS GEMINI 4WIRE 3FR (BASKET) IMPLANT
BASKET ZERO TIP NITINOL 2.4FR (BASKET) ×1 IMPLANT
BSKT STON RTRVL 120 1.9FR (BASKET)
BSKT STON RTRVL GEM 120X11 3FR (BASKET)
BSKT STON RTRVL ZERO TP 2.4FR (BASKET) ×1
CATH CLEAR GEL 3F BACKSTOP (CATHETERS) IMPLANT
CATH URET 5FR 28IN CONE TIP (BALLOONS)
CATH URET 5FR 28IN OPEN ENDED (CATHETERS) IMPLANT
CATH URET 5FR 70CM CONE TIP (BALLOONS) IMPLANT
CATH URET DUAL LUMEN 6-10FR 50 (CATHETERS) IMPLANT
CLOTH BEACON ORANGE TIMEOUT ST (SAFETY) ×2 IMPLANT
DRAPE CAMERA CLOSED 9X96 (DRAPES) ×2 IMPLANT
FIBER LASER FLEXIVA 200 (UROLOGICAL SUPPLIES) ×1 IMPLANT
FIBER LASER FLEXIVA 365 (UROLOGICAL SUPPLIES) IMPLANT
GLOVE BIOGEL M 7.0 STRL (GLOVE) ×2 IMPLANT
GOWN STRL REUS W/TWL LRG LVL3 (GOWN DISPOSABLE) ×2 IMPLANT
GOWN STRL REUS W/TWL XL LVL3 (GOWN DISPOSABLE) ×2 IMPLANT
GUIDEWIRE ANG ZIPWIRE 038X150 (WIRE) IMPLANT
GUIDEWIRE STR DUAL SENSOR (WIRE) ×3 IMPLANT
IV NS IRRIG 3000ML ARTHROMATIC (IV SOLUTION) ×2 IMPLANT
MANIFOLD NEPTUNE II (INSTRUMENTS) ×2 IMPLANT
PACK CYSTO (CUSTOM PROCEDURE TRAY) ×2 IMPLANT
SCRUB PCMX 4 OZ (MISCELLANEOUS) IMPLANT
SHEATH ACCESS URETERAL 38CM (SHEATH) ×1 IMPLANT
SHEATH URET ACCESS 12FR/35CM (UROLOGICAL SUPPLIES) IMPLANT
SHEATH URET ACCESS 12FR/55CM (UROLOGICAL SUPPLIES) IMPLANT
STENT POLARIS 5FRX24 (STENTS) ×1 IMPLANT
SYRINGE IRR TOOMEY STRL 70CC (SYRINGE) IMPLANT
TUBING CONNECTING 10 (TUBING) ×2 IMPLANT

## 2013-04-30 NOTE — Op Note (Signed)
Urology Operative Report  Date of Procedure: 04/30/13  Surgeon: Rolan Bucco, MD Assistant:  None  Preoperative Diagnosis: Right nephrolithiasis Postoperative Diagnosis:  Same  Procedure(s): Right ureteroscopy with laser lithotripsy and stone removal. Right ureter stent placement (5 x 24 polaris) Right ureter stent removal. Fluoroscopy less than one hour.  Estimated blood loss: None  Specimen: Stones sent for chemical analysis at [facility] lab.  Drains: None  Complications: None  Findings: Right nephrolithiasis. Negative bladder tumors. Negative ureter strictures.  History of present illness: 80 year old female presents today for ureteroscopy and laser lithotripsy of the right kidney stone. This wasn't affected previously and required ureter stent placement in December 2014.   Procedure in detail: After informed consent was obtained, the patient was taken to the operating room. They were placed in the supine position. SCDs were turned on and in place. IV antibiotics were infused, and general anesthesia was induced. A timeout was performed in which the correct patient, surgical site, and procedure were identified and agreed upon by the team.  The patient was placed in a dorsolithotomy position, making sure to pad all pertinent neurovascular pressure points. The genitals were prepped and draped in the usual sterile fashion.  A rigid cystoscope was best to the urethra and into the bladder. The bladder was drained. Attention was turned to the right ureter orifice. A sensor wire was placed alongside the right indwelling ureter stent and into the right renal pelvis on fluoroscopy. The stent was then removed.  Right ureteroscopy was performed alongside the safety wire which was secured to the drape. I was able to navigate all the way up into the right renal pelvis. I evaluated the renal calyces in a systematic fashion to visualize the entire surface of the kidney. There was noted to be a  large renal pelvis stone. I then placed a second sensor wire through the ureter scope and withdrew the ureter scope.  I placed a 12-14 ureter access sheath over the working wire under fluoroscopy into the right renal pelvis with ease. The obturator and wire were then removed. I then placed the ureter scope and performed lithotripsy with a 200  holmium laser filament at the settings of 0.5 J and 20 Hz. The stone was broken up into very small fragments. All the fragments which were large enough to be grasped were grasped and removed with a 0 tip Nitinol basket. The remaining fragments were too small to be removed and therefore I elected to place a stent to allow drainage of these remaining fragments.  Allergies the ureter scope and access sheath and visualized the entire surface of the ureter. There is no injury to the ureter noted.  I placed a right ureter stent by feeding the safety wire through the cystoscope and placing a 5 x 24 Polaris stent without the tethering strings up over the wire. This was deployed with a good curl in the right renal pelvis and the loops within the bladder. The bladder was then drained.  I placed 10 cc of lidocaine jelly into the urethra and a belladonna and opium suppository to the rectum.  This completed the procedure. Anesthesia was reversed and she was placed in a supine position. She was taken to the [facility] in stable condition. Stone fragments were taken to the lab for analysis.  All counts were correct at the end of the case.  She will be discharged home with a few days of Cipro. She'll followup next week.

## 2013-04-30 NOTE — Anesthesia Postprocedure Evaluation (Signed)
  Anesthesia Post-op Note  Patient: Laura [name]  Procedure(s) Performed: Procedure(s) (LRB): CYSTOSCOPY WITH RETROGRADE PYELOGRAM, URETEROSCOPY AND STENT EXCHANGE (Right) HOLMIUM LASER APPLICATION (Right)  Patient Location: PACU  Anesthesia Type: General  Level of Consciousness: awake and alert   Airway and Oxygen Therapy: Patient Spontanous Breathing  Post-op Pain: mild  Post-op Assessment: Post-op Vital signs reviewed, Patient's Cardiovascular Status Stable, Respiratory Function Stable, Patent Airway and No signs of Nausea or vomiting  Last Vitals:  Filed Vitals:   04/30/13 1350  BP: 126/41  Pulse: 71  Temp:   Resp: 20    Post-op Vital Signs: stable   Complications: No apparent anesthesia complications

## 2013-04-30 NOTE — Discharge Instructions (Signed)
DISCHARGE INSTRUCTIONS FOR KIDNEY STONES OR URETERAL STENT  MEDICATIONS:   1. Resume all your other meds from home.  ACTIVITY 1. No strenuous activity x 1week 2. No driving while on narcotic pain medications 3. Drink plenty of water 4. Continue to walk at home - you can still get blood clots when you are at home, so keep active, but don't over do it. 5. May return to work in 3 days.  BATHING 1. You can shower or take a bath.    SIGNS/SYMPTOMS TO CALL: 1. Please call us if you have a fever greater than 101.5, uncontrolled  nausea/vomiting, uncontrolled pain, dizziness, unable to urinate, chest pain, shortness of breath, leg swelling, leg pain, redness around wound, drainage from wound, or any other concerns or questions.  You can reach [place] at [phone].

## 2013-04-30 NOTE — Transfer of Care (Signed)
Immediate Anesthesia Transfer of Care Note  Patient: Laura [name]  Procedure(s) Performed: Procedure(s): CYSTOSCOPY WITH RETROGRADE PYELOGRAM, URETEROSCOPY AND STENT EXCHANGE (Right) HOLMIUM LASER APPLICATION (Right)  Patient Location: PACU  Anesthesia Type:General  Level of Consciousness: awake and alert   Airway & Oxygen Therapy: Patient Spontanous Breathing and Patient connected to face mask oxygen  Post-op Assessment: Report given to PACU RN and Post -op Vital signs reviewed and stable  Post vital signs: Reviewed and stable  Complications: No apparent anesthesia complications

## 2013-04-30 NOTE — Progress Notes (Signed)
Patient states she had an ekg at Dr Thurman Coyer office last week. Refuses to repeat it. Waiting for office to open to request copy.

## 2013-04-30 NOTE — H&P (Signed)
Urology History and Physical Exam  CC: Right nephrolithiasis.  HPI:  80 year old female presents today for right nephrolithiasis.  This was discovered on CT scan when she was in the hospital in December 2014.  She had a urinary tract infection.  At that time I was consulted urgently as she was having fevers.  A CT revealed a right renal pelvic stone.  It was 1.2 x 0.6 cm in size.  This was associated with Escherichia coli urinary tract infection.  A right ureter stent was placed 04/04/13.  She was treated with 14 days of ciprofloxacin.  We reviewed treatment options along with risks, benefits, alternatives, and likelihood of achieving goals.  She presents today for cystoscopy, right ureteroscopy, laser lithotripsy, right retrograde pyelogram, and right ureter stent exchange.  She has a history of heart attack, but has been cleared by her cardiologist, Dr. Wynonia Lawman, for surgery.  Negative recent chest pain.  A catheterized specimen for UA 04/23/13 was negative for signs of infection.  PMH: Past Medical History  Diagnosis Date  . Coronary artery disease   . Myocardial infarction   . Hypertension   . Arthritis   . Asthma   . Chronic bronchitis   . Urinary tract infection   . Kidney stone   . Hydronephrosis   . Complication of anesthesia   . PONV (postoperative nausea and vomiting)     PSH: Past Surgical History  Procedure Laterality Date  . Coronary stent placement    . Appendectomy    . Tonsillectomy    . Knee surgery    . Cystoscopy w/ ureteral stent placement Right 04/04/2013    Procedure: CYSTOSCOPY WITH RETROGRADE PYELOGRAM/URETERAL STENT PLACEMENT;  Surgeon: Molli Hazard, MD;  Location: [facility];  Service: Urology;  Laterality: Right;    Allergies: Allergies  Allergen Reactions  . Codeine Nausea And Vomiting  . Hydrocodone Other (See Comments)    delusions  . Morphine And Related Other (See Comments)    Extremely cofused    Medications: Prescriptions prior to  admission  Medication Sig Dispense Refill  . acetaminophen (TYLENOL) 325 MG tablet Take 650 mg by mouth every 6 (six) hours as needed for mild pain or moderate pain.      Marland Kitchen albuterol (PROVENTIL HFA;VENTOLIN HFA) 108 (90 BASE) MCG/ACT inhaler Inhale 2 puffs into the lungs every 4 (four) hours as needed. For cough or wheezing      . aspirin 81 MG tablet Take 162 mg by mouth daily.       . DUREZOL 0.05 % EMUL Place 1 drop into the right eye 2 (two) times daily.       . furosemide (LASIX) 40 MG tablet Take 40 mg by mouth.      . isosorbide mononitrate (IMDUR) 30 MG 24 hr tablet Take 30 mg by mouth daily.      . metoprolol (LOPRESSOR) 100 MG tablet Take 100 mg by mouth 2 (two) times daily.      . mirabegron ER (MYRBETRIQ) 25 MG TB24 tablet Take 25 mg by mouth daily.      . montelukast (SINGULAIR) 10 MG tablet Take 10 mg by mouth at bedtime.      . rosuvastatin (CRESTOR) 20 MG tablet Take 20 mg by mouth daily with supper. Per Nursing Home MAR- taken at 2000      . sertraline (ZOLOFT) 50 MG tablet Take 50 mg by mouth daily with breakfast.       . Vitamin D, Ergocalciferol, (DRISDOL) 50000 UNITS  CAPS capsule Take 50,000 Units by mouth every 7 (seven) days.      . ondansetron (ZOFRAN-ODT) 8 MG disintegrating tablet Take 8 mg by mouth every 8 (eight) hours as needed for nausea.       Marland Kitchen oxybutynin (DITROPAN) 5 MG tablet Take 5 mg by mouth every 6 (six) hours as needed for bladder spasms.      . tamsulosin (FLOMAX) 0.4 MG CAPS capsule Take 0.4 mg by mouth at bedtime.         Social History: History   Social History  . Marital Status: Married    Spouse Name: N/A    Number of Children: N/A  . Years of Education: N/A   Occupational History  . Not on file.   Social History Main Topics  . Smoking status: Never Smoker   . Smokeless tobacco: Never Used  . Alcohol Use: No  . Drug Use: No  . Sexual Activity: Not on file   Other Topics Concern  . Not on file   Social History Narrative  . No  narrative on file    Family History: Family History  Problem Relation Age of Onset  . Adopted: Yes    Review of Systems: Positive: Bladder spasms. Negative: Fever, SOB, or chest pain.  A further 10 point review of systems was negative except what is listed in the HPI.  Physical Exam: Filed Vitals:   04/30/13 0651  BP: 143/50  Pulse: 56  Temp: 98.3 F (36.8 C)  Resp: 18    General: No acute distress.  Awake. Head:  Normocephalic.  Atraumatic. ENT:  EOMI.  Mucous membranes moist Neck:  Supple.  No lymphadenopathy. CV:  S1 present. S2 present. Regular rate. Pulmonary: Equal effort bilaterally.  Clear to auscultation bilaterally. Abdomen: Soft.  Non- tender to palpation. Skin:  Normal turgor.  No visible rash. Extremity: No gross deformity of bilateral upper extremities.  No gross deformity of    bilateral lower extremities. Neurologic: Alert. Appropriate mood.    Studies:  No results found for this basename: HGB, WBC, PLT,  in the last 72 hours  No results found for this basename: NA, K, CL, CO2, BUN, CREATININE, CALCIUM, MAGNESIUM, GFRNONAA, GFRAA,  in the last 72 hours   No results found for this basename: PT, INR, APTT,  in the last 72 hours   No components found with this basename: ABG,     Assessment:  Right nephrolithiasis.  Plan: To OR  for cystoscopy, right ureteroscopy, laser lithotripsy, right retrograde pyelogram, and right ureter stent exchange.

## 2013-04-30 NOTE — Anesthesia Preprocedure Evaluation (Signed)
Anesthesia Evaluation  Patient identified by MRN, date of birth, ID band Patient awake    Reviewed: Allergy & Precautions, H&P , NPO status , Patient's Chart, lab work & pertinent test results  History of Anesthesia Complications (+) PONV  Airway Mallampati: II TM Distance: <3 FB Neck ROM: Full    Dental no notable dental hx.    Pulmonary shortness of breath, asthma , COPD oxygen dependent,  O2 at night breath sounds clear to auscultation  + decreased breath sounds      Cardiovascular hypertension, + CAD, + Past MI, + Cardiac Stents, + Peripheral Vascular Disease and +CHF Rhythm:Regular Rate:Normal  Left ventricle:  Systolic function was normal. The estimated ejection fraction was in the range of 50% to 55%. Images were inadequate for LV wall motion assessment.  Findings: Fusiform descending thoracic aortic aneurysm measuring 4.4 cm (series 2/image 47), unchanged.  Aneurysm extends to just above the level of the celiac artery origin.  Associated eccentric mural thrombus.  No evidence of aortic dissection.   Additional thoracic aortic measurements are as follows: --Descending thoracic aorta:  3.6 cm (series 4/image 31) --Proximal descending thoracic aorta:  2.5 cm (series 2/image 22) --Aortic hiatus:  2.7 cm (series 2/image 60)     Neuro/Psych negative neurological ROS  negative psych ROS   GI/Hepatic negative GI ROS, Neg liver ROS,   Endo/Other  Morbid obesity  Renal/GU negative Renal ROS  negative genitourinary   Musculoskeletal negative musculoskeletal ROS (+)   Abdominal   Peds negative pediatric ROS (+)  Hematology negative hematology ROS (+)   Anesthesia Other Findings   Reproductive/Obstetrics negative OB ROS                           Anesthesia Physical  Anesthesia Plan  ASA: III  Anesthesia Plan: General   Post-op Pain Management:    Induction: Intravenous  Airway  Management Planned: LMA  Additional Equipment:   Intra-op Plan:   Post-operative Plan: Extubation in OR  Informed Consent: I have reviewed the patients History and Physical, chart, labs and discussed the procedure including the risks, benefits and alternatives for the proposed anesthesia with the patient or authorized representative who has indicated his/her understanding and acceptance.   Dental advisory given  Plan Discussed with: CRNA and Surgeon  Anesthesia Plan Comments:         Anesthesia Quick Evaluation

## 2013-05-01 ENCOUNTER — Encounter (HOSPITAL_COMMUNITY): Payer: Self-pay | Admitting: Urology

## 2013-05-05 ENCOUNTER — Other Ambulatory Visit: Payer: Self-pay

## 2013-05-05 ENCOUNTER — Non-Acute Institutional Stay (SKILLED_NURSING_FACILITY): Payer: Medicare Other | Admitting: Adult Health

## 2013-05-05 DIAGNOSIS — J189 Pneumonia, unspecified organism: Secondary | ICD-10-CM

## 2013-05-05 LAB — CBC WITH DIFFERENTIAL/PLATELET
BASOS ABS: 0.1 10*3/uL (ref 0.0–0.1)
Basophil %: 1 %
EOS PCT: 1 %
Eosinophil #: 0.1 10*3/uL (ref 0.0–0.7)
HCT: 39.9 % (ref 35.0–47.0)
HGB: 12.4 g/dL (ref 12.0–16.0)
LYMPHS ABS: 1 10*3/uL (ref 1.0–3.6)
Lymphocyte %: 10.4 %
MCH: 26.3 pg (ref 26.0–34.0)
MCHC: 31.1 g/dL — ABNORMAL LOW (ref 32.0–36.0)
MCV: 85 fL (ref 80–100)
MONOS PCT: 5.6 %
Monocyte #: 0.5 x10 3/mm (ref 0.2–0.9)
NEUTROS ABS: 8 10*3/uL — AB (ref 1.4–6.5)
NEUTROS PCT: 82 %
Platelet: 130 10*3/uL — ABNORMAL LOW (ref 150–440)
RBC: 4.72 10*6/uL (ref 3.80–5.20)
RDW: 17.9 % — ABNORMAL HIGH (ref 11.5–14.5)
WBC: 9.7 10*3/uL (ref 3.6–11.0)

## 2013-05-05 LAB — PRO B NATRIURETIC PEPTIDE: B-Type Natriuretic Peptide: 10633 pg/mL — ABNORMAL HIGH (ref 0–450)

## 2013-05-05 LAB — BASIC METABOLIC PANEL
Anion Gap: 5 — ABNORMAL LOW (ref 7–16)
BUN: 14 mg/dL (ref 7–18)
CO2: 32 mmol/L (ref 21–32)
Calcium, Total: 9.5 mg/dL (ref 8.5–10.1)
Chloride: 100 mmol/L (ref 98–107)
Creatinine: 0.78 mg/dL (ref 0.60–1.30)
EGFR (African American): >60
EGFR (Non-African Amer.): >60
Glucose: 118 mg/dL — ABNORMAL HIGH (ref 65–99)
Osmolality: 275 (ref 275–301)
Potassium: 3.6 mmol/L (ref 3.5–5.1)
SODIUM: 137 mmol/L (ref 136–145)

## 2013-05-06 DIAGNOSIS — J189 Pneumonia, unspecified organism: Secondary | ICD-10-CM | POA: Insufficient documentation

## 2013-05-06 NOTE — Progress Notes (Signed)
Patient ID: Laura [name], female   DOB: 06-01-1929, 80 y.o.   MRN: [id number]         PROGRESS NOTE  DATE: 05/05/2013  FACILITY:  [facility]  LEVEL OF CARE: SNF (31)  Acute Visit   CHIEF COMPLAINT:  Manage Pneumonitis  HISTORY OF PRESENT ILLNESS: This is an 80 year old female who was noted to have SOB. Breath sounds are diminished but no crackles nor wheezing. Chest x-ray shows patchy pneumonitis or edema at the mid and lower lungs bilaterally. No reported fever.  PAST MEDICAL HISTORY : Reviewed.  No changes.  CURRENT MEDICATIONS: Reviewed per [facility]  REVIEW OF SYSTEMS:  GENERAL: no change in appetite, no fatigue, no weight changes, no fever, chills or weakness RESPIRATORY: no cough, DOE,, wheezing, hemoptysis, +SOB CARDIAC: no chest pain, edema or palpitations GI: no abdominal pain, diarrhea, constipation, heart burn, nausea or vomiting  PHYSICAL EXAMINATION  VS:  T97.8        P74      RR20       BP108/56      POX92 %       WT 210.0(Lb)  GENERAL: no acute distress, normal body habitus EYES: conjunctivae normal, sclerae normal, normal eye lids NECK: supple, trachea midline, no neck masses, no thyroid tenderness, no thyromegaly LYMPHATICS: no LAN in the neck, no supraclavicular LAN RESPIRATORY: breathing is slightly labored, BS decreased CARDIAC: RRR, no murmur,no extra heart sounds, no edema GI: abdomen soft, normal BS, no masses, no tenderness, no hepatomegaly, no splenomegaly PSYCHIATRIC: the patient is alert & oriented to person, affect & behavior appropriate  LABS/RADIOLOGY: Basic Metabolic Panel:  Recent Labs  04/05/13 0455 04/06/13 0440 04/07/13 0432  NA 137 138 139  K 4.1 4.3 4.2  CL 102 105 103  CO2 25 24 28   GLUCOSE 86 122* 107*  BUN 37* 22 16  CREATININE 1.44* 1.04 0.90  CALCIUM 8.6 8.6 8.8    CBC:  Recent Labs  04/03/13 2031  04/04/13 0514 04/05/13 0455 04/06/13 0440  WBC 18.4*  --  14.7* 11.3* 8.6  NEUTROABS 16.9*  --    --   --   --   HGB 11.5*  < > 11.0* 11.3* 11.6*  HCT 36.3  < > 35.1* 37.0 38.1  MCV 81.0  --  81.1 83.5 85.4  PLT 129*  --  116* 131* 119*  < > = values in this interval not displayed.   ASSESSMENT/PLAN:  Pneumonitis - start doxycycline 100 mg by mouth twice a day x10 days; albuterol 2.5 mg/3 mL 1 Neb at 6 AM, 2 PM and 10 PM x5 days     CPT CODE: 29528

## 2013-05-08 ENCOUNTER — Other Ambulatory Visit: Payer: Self-pay | Admitting: Interventional Cardiology

## 2013-05-08 LAB — URINALYSIS, COMPLETE
BILIRUBIN, UR: NEGATIVE
Glucose,UR: NEGATIVE mg/dL (ref 0–75)
Ketone: NEGATIVE
NITRITE: NEGATIVE
Ph: 5 (ref 4.5–8.0)
RBC,UR: 42 /HPF (ref 0–5)
Specific Gravity: 1.013 (ref 1.003–1.030)

## 2013-05-11 LAB — URINE CULTURE

## 2013-05-14 ENCOUNTER — Non-Acute Institutional Stay (SKILLED_NURSING_FACILITY): Payer: Medicare Other | Admitting: Adult Health

## 2013-05-14 DIAGNOSIS — F32A Depression, unspecified: Secondary | ICD-10-CM | POA: Insufficient documentation

## 2013-05-14 DIAGNOSIS — I509 Heart failure, unspecified: Secondary | ICD-10-CM

## 2013-05-14 DIAGNOSIS — I2510 Atherosclerotic heart disease of native coronary artery without angina pectoris: Secondary | ICD-10-CM

## 2013-05-14 DIAGNOSIS — J45909 Unspecified asthma, uncomplicated: Secondary | ICD-10-CM

## 2013-05-14 DIAGNOSIS — F3289 Other specified depressive episodes: Secondary | ICD-10-CM

## 2013-05-14 DIAGNOSIS — I10 Essential (primary) hypertension: Secondary | ICD-10-CM

## 2013-05-14 DIAGNOSIS — F329 Major depressive disorder, single episode, unspecified: Secondary | ICD-10-CM | POA: Insufficient documentation

## 2013-05-14 DIAGNOSIS — E785 Hyperlipidemia, unspecified: Secondary | ICD-10-CM

## 2013-05-14 NOTE — Progress Notes (Signed)
Patient ID: Laura [name], female   DOB: September 18, 1929, 80 y.o.   MRN: [id number]           PROGRESS NOTE  DATE:  05/14/13  FACILITY:  [facility]  LEVEL OF CARE: SNF (31)  Acute Visit   CHIEF COMPLAINT:  Discharge Notes  HISTORY OF PRESENT ILLNESS: This is an 80 year old female who is for discharge home with Home health PT, OT and Nursing. She has been admitted to [facility] on 04/07/14 from [facility] with Pyelonephritis/UTI and was treated with antibiotics. She was also noted to have right hydronephrosis with a right UPJ stone and had right ureteral stenting. Patient was admitted to this facility for short-term rehabilitation after the patient's recent hospitalization.  Patient has completed SNF rehabilitation and therapy has cleared the patient for discharge.  RE-ASSESSMENT OF ONGOING PROBLEMS:  HTN: Pt 's HTN remains stable.  Denies CP, sob, DOE, pedal edema, headaches, dizziness or visual disturbances.  No complications from the medications currently being used.  Last BP : 125/61  ASTHMA: The patient's asthma remains stable. Patient denies shortness of breath, dyspnea on exertion or wheezing. No complications reported from the medications currently being used.  DEPRESSION: The depression remains stable. Patient denies ongoing feelings of sadness, insomnia, anedhonia or lack of appetite. No complications reported from the medications currently being used. Staff do not report behavioral problems.   PAST MEDICAL HISTORY : Reviewed.  No changes.  CURRENT MEDICATIONS: Reviewed per [facility]  REVIEW OF SYSTEMS:  GENERAL: no change in appetite, no fatigue, no weight changes, no fever, chills or weakness RESPIRATORY: no cough, DOE,, wheezing, hemoptysis, +SOB CARDIAC: no chest pain, or palpitations, +edema GI: no abdominal pain, diarrhea, constipation, heart burn, nausea or vomiting  PHYSICAL EXAMINATION  VS:  T97.8        P74      RR20       BP108/56       POX92 %       WT 210.0(Lb)  GENERAL: no acute distress, normal body habitus EYES: conjunctivae normal, sclerae normal, normal eye lids NECK: supple, trachea midline, no neck masses, no thyroid tenderness, no thyromegaly LYMPHATICS: no LAN in the neck, no supraclavicular LAN RESPIRATORY: breathing is slightly labored, BS decreased CARDIAC: RRR, no murmur,no extra heart sounds, BLE edema 2+ GI: abdomen soft, normal BS, no masses, no tenderness, no hepatomegaly, no splenomegaly PSYCHIATRIC: the patient is alert & oriented to person, affect & behavior appropriate  LABS/RADIOLOGY:  05/11/13 sodium 141 potassium 3.1 glucose 81 BUN 31 creatinine 0.9 calcium 8.8 04/21/13 sodium 142 potassium 3.7 glucose 132 BUN 18 creatinine 1.0 calcium 8.6 04/03/13 WBC 9.7 hemoglobin 12.4 hematocrit 39.9 glucose 118 BUN 14 creatinine 0.78 sodium 137 potassium 3.6 calcium 9.5 Basic Metabolic Panel:  Recent Labs  04/05/13 0455 04/06/13 0440 04/07/13 0432  NA 137 138 139  K 4.1 4.3 4.2  CL 102 105 103  CO2 25 24 28   GLUCOSE 86 122* 107*  BUN 37* 22 16  CREATININE 1.44* 1.04 0.90  CALCIUM 8.6 8.6 8.8    CBC:  Recent Labs  04/03/13 2031  04/04/13 0514 04/05/13 0455 04/06/13 0440  WBC 18.4*  --  14.7* 11.3* 8.6  NEUTROABS 16.9*  --   --   --   --   HGB 11.5*  < > 11.0* 11.3* 11.6*  HCT 36.3  < > 35.1* 37.0 38.1  MCV 81.0  --  81.1 83.5 85.4  PLT 129*  --  116* 131* 119*  < > = values in this interval not displayed.   ASSESSMENT/PLAN:  Hypertension - well controlled; continue metoprolol  CHF - well compensated; continue Lasix  Asthma - stable  Depression - continue Zoloft  Hyperlipidemia - continue Lipitor  CAD - stable; continue Imdur   I have filled out patient's discharge paperwork and written prescriptions.  Patient will receive home health PT, OT and Nursing.   Total discharge time: Less than 30 minutes Discharge time involved coordination of the discharge process with Research officer, political party, nursing staff and therapy department. Medical justification for home health services verified.   CPT CODE: 35701

## 2013-07-31 ENCOUNTER — Inpatient Hospital Stay (HOSPITAL_COMMUNITY)
Admission: EM | Admit: 2013-07-31 | Discharge: 2013-08-11 | DRG: 291 | Disposition: A | Discharge status: Skilled Nursing Facility | Payer: Medicare Other | Attending: Pulmonary Disease | Admitting: Pulmonary Disease

## 2013-07-31 ENCOUNTER — Encounter (HOSPITAL_COMMUNITY): Payer: Self-pay | Admitting: Emergency Medicine

## 2013-07-31 ENCOUNTER — Emergency Department (HOSPITAL_COMMUNITY): Payer: Medicare Other

## 2013-07-31 DIAGNOSIS — F329 Major depressive disorder, single episode, unspecified: Secondary | ICD-10-CM

## 2013-07-31 DIAGNOSIS — I959 Hypotension, unspecified: Secondary | ICD-10-CM | POA: Diagnosis not present

## 2013-07-31 DIAGNOSIS — I712 Thoracic aortic aneurysm, without rupture, unspecified: Secondary | ICD-10-CM | POA: Diagnosis present

## 2013-07-31 DIAGNOSIS — F3289 Other specified depressive episodes: Secondary | ICD-10-CM | POA: Diagnosis present

## 2013-07-31 DIAGNOSIS — Z6833 Body mass index (BMI) 33.0-33.9, adult: Secondary | ICD-10-CM

## 2013-07-31 DIAGNOSIS — E870 Hyperosmolality and hypernatremia: Secondary | ICD-10-CM | POA: Diagnosis not present

## 2013-07-31 DIAGNOSIS — Z66 Do not resuscitate: Secondary | ICD-10-CM | POA: Diagnosis not present

## 2013-07-31 DIAGNOSIS — IMO0001 Reserved for inherently not codable concepts without codable children: Secondary | ICD-10-CM

## 2013-07-31 DIAGNOSIS — E669 Obesity, unspecified: Secondary | ICD-10-CM

## 2013-07-31 DIAGNOSIS — I5042 Chronic combined systolic (congestive) and diastolic (congestive) heart failure: Secondary | ICD-10-CM | POA: Diagnosis present

## 2013-07-31 DIAGNOSIS — A498 Other bacterial infections of unspecified site: Secondary | ICD-10-CM | POA: Diagnosis not present

## 2013-07-31 DIAGNOSIS — E662 Morbid (severe) obesity with alveolar hypoventilation: Secondary | ICD-10-CM | POA: Diagnosis present

## 2013-07-31 DIAGNOSIS — F32A Depression, unspecified: Secondary | ICD-10-CM

## 2013-07-31 DIAGNOSIS — I10 Essential (primary) hypertension: Secondary | ICD-10-CM | POA: Diagnosis present

## 2013-07-31 DIAGNOSIS — K219 Gastro-esophageal reflux disease without esophagitis: Secondary | ICD-10-CM | POA: Diagnosis present

## 2013-07-31 DIAGNOSIS — J4489 Other specified chronic obstructive pulmonary disease: Secondary | ICD-10-CM | POA: Diagnosis present

## 2013-07-31 DIAGNOSIS — J45909 Unspecified asthma, uncomplicated: Secondary | ICD-10-CM

## 2013-07-31 DIAGNOSIS — E861 Hypovolemia: Secondary | ICD-10-CM | POA: Diagnosis not present

## 2013-07-31 DIAGNOSIS — I119 Hypertensive heart disease without heart failure: Secondary | ICD-10-CM

## 2013-07-31 DIAGNOSIS — E876 Hypokalemia: Secondary | ICD-10-CM | POA: Diagnosis not present

## 2013-07-31 DIAGNOSIS — I252 Old myocardial infarction: Secondary | ICD-10-CM

## 2013-07-31 DIAGNOSIS — Z7982 Long term (current) use of aspirin: Secondary | ICD-10-CM

## 2013-07-31 DIAGNOSIS — I509 Heart failure, unspecified: Secondary | ICD-10-CM

## 2013-07-31 DIAGNOSIS — I5033 Acute on chronic diastolic (congestive) heart failure: Secondary | ICD-10-CM

## 2013-07-31 DIAGNOSIS — I5032 Chronic diastolic (congestive) heart failure: Secondary | ICD-10-CM

## 2013-07-31 DIAGNOSIS — E874 Mixed disorder of acid-base balance: Secondary | ICD-10-CM | POA: Diagnosis not present

## 2013-07-31 DIAGNOSIS — D696 Thrombocytopenia, unspecified: Secondary | ICD-10-CM | POA: Diagnosis present

## 2013-07-31 DIAGNOSIS — D689 Coagulation defect, unspecified: Secondary | ICD-10-CM | POA: Diagnosis present

## 2013-07-31 DIAGNOSIS — T502X5A Adverse effect of carbonic-anhydrase inhibitors, benzothiadiazides and other diuretics, initial encounter: Secondary | ICD-10-CM | POA: Diagnosis present

## 2013-07-31 DIAGNOSIS — E785 Hyperlipidemia, unspecified: Secondary | ICD-10-CM | POA: Diagnosis present

## 2013-07-31 DIAGNOSIS — R0902 Hypoxemia: Secondary | ICD-10-CM

## 2013-07-31 DIAGNOSIS — N179 Acute kidney failure, unspecified: Secondary | ICD-10-CM

## 2013-07-31 DIAGNOSIS — I2789 Other specified pulmonary heart diseases: Secondary | ICD-10-CM | POA: Diagnosis present

## 2013-07-31 DIAGNOSIS — N390 Urinary tract infection, site not specified: Secondary | ICD-10-CM | POA: Diagnosis not present

## 2013-07-31 DIAGNOSIS — Z885 Allergy status to narcotic agent status: Secondary | ICD-10-CM

## 2013-07-31 DIAGNOSIS — Z9861 Coronary angioplasty status: Secondary | ICD-10-CM

## 2013-07-31 DIAGNOSIS — J449 Chronic obstructive pulmonary disease, unspecified: Secondary | ICD-10-CM | POA: Diagnosis present

## 2013-07-31 DIAGNOSIS — I2510 Atherosclerotic heart disease of native coronary artery without angina pectoris: Secondary | ICD-10-CM | POA: Diagnosis present

## 2013-07-31 DIAGNOSIS — G4733 Obstructive sleep apnea (adult) (pediatric): Secondary | ICD-10-CM | POA: Diagnosis present

## 2013-07-31 DIAGNOSIS — R0689 Other abnormalities of breathing: Secondary | ICD-10-CM

## 2013-07-31 DIAGNOSIS — G9340 Encephalopathy, unspecified: Secondary | ICD-10-CM | POA: Diagnosis present

## 2013-07-31 DIAGNOSIS — R0602 Shortness of breath: Secondary | ICD-10-CM

## 2013-07-31 DIAGNOSIS — Z9981 Dependence on supplemental oxygen: Secondary | ICD-10-CM

## 2013-07-31 DIAGNOSIS — I498 Other specified cardiac arrhythmias: Secondary | ICD-10-CM | POA: Diagnosis not present

## 2013-07-31 DIAGNOSIS — M129 Arthropathy, unspecified: Secondary | ICD-10-CM | POA: Diagnosis present

## 2013-07-31 DIAGNOSIS — D649 Anemia, unspecified: Secondary | ICD-10-CM | POA: Diagnosis present

## 2013-07-31 DIAGNOSIS — E039 Hypothyroidism, unspecified: Secondary | ICD-10-CM | POA: Diagnosis present

## 2013-07-31 DIAGNOSIS — N200 Calculus of kidney: Secondary | ICD-10-CM

## 2013-07-31 DIAGNOSIS — T465X5A Adverse effect of other antihypertensive drugs, initial encounter: Secondary | ICD-10-CM | POA: Diagnosis present

## 2013-07-31 DIAGNOSIS — I5031 Acute diastolic (congestive) heart failure: Secondary | ICD-10-CM

## 2013-07-31 DIAGNOSIS — J9620 Acute and chronic respiratory failure, unspecified whether with hypoxia or hypercapnia: Secondary | ICD-10-CM | POA: Diagnosis present

## 2013-07-31 HISTORY — DX: Depression, unspecified: F32.A

## 2013-07-31 HISTORY — DX: Major depressive disorder, single episode, unspecified: F32.9

## 2013-07-31 HISTORY — DX: Peripheral vascular disease, unspecified: I73.9

## 2013-07-31 HISTORY — DX: Sleep apnea, unspecified: G47.30

## 2013-07-31 LAB — CBC
HEMATOCRIT: 34.3 % — AB (ref 36.0–46.0)
Hemoglobin: 10 g/dL — ABNORMAL LOW (ref 12.0–15.0)
MCH: 27.2 pg (ref 26.0–34.0)
MCHC: 29.2 g/dL — AB (ref 30.0–36.0)
MCV: 93.2 fL (ref 78.0–100.0)
Platelets: 106 10*3/uL — ABNORMAL LOW (ref 150–400)
RBC: 3.68 MIL/uL — ABNORMAL LOW (ref 3.87–5.11)
RDW: 14.8 % (ref 11.5–15.5)
WBC: 8.3 10*3/uL (ref 4.0–10.5)

## 2013-07-31 LAB — URINALYSIS, ROUTINE W REFLEX MICROSCOPIC
Bilirubin Urine: NEGATIVE
Glucose, UA: NEGATIVE mg/dL
HGB URINE DIPSTICK: NEGATIVE
Ketones, ur: NEGATIVE mg/dL
Nitrite: NEGATIVE
PROTEIN: 100 mg/dL — AB
Specific Gravity, Urine: 1.017 (ref 1.005–1.030)
Urobilinogen, UA: 1 mg/dL (ref 0.0–1.0)
pH: 6.5 (ref 5.0–8.0)

## 2013-07-31 LAB — COMPREHENSIVE METABOLIC PANEL
ALBUMIN: 3.4 g/dL — AB (ref 3.5–5.2)
ALT: 9 U/L (ref 0–35)
AST: 13 U/L (ref 0–37)
Alkaline Phosphatase: 65 U/L (ref 39–117)
BILIRUBIN TOTAL: 0.6 mg/dL (ref 0.3–1.2)
BUN: 15 mg/dL (ref 6–23)
CO2: 36 mEq/L — ABNORMAL HIGH (ref 19–32)
CREATININE: 0.7 mg/dL (ref 0.50–1.10)
Calcium: 9.2 mg/dL (ref 8.4–10.5)
Chloride: 97 mEq/L (ref 96–112)
GFR calc Af Amer: >90 mL/min (ref >90)
GFR calc non Af Amer: 79 mL/min — ABNORMAL LOW (ref >90)
Glucose, Bld: 125 mg/dL — ABNORMAL HIGH (ref 70–99)
Potassium: 4.2 mEq/L (ref 3.7–5.3)
Sodium: 143 mEq/L (ref 137–147)
Total Protein: 7.1 g/dL (ref 6.0–8.3)

## 2013-07-31 LAB — URINE MICROSCOPIC-ADD ON

## 2013-07-31 LAB — PRO B NATRIURETIC PEPTIDE: Pro B Natriuretic peptide (BNP): 7176 pg/mL — ABNORMAL HIGH (ref 0–450)

## 2013-07-31 LAB — I-STAT TROPONIN, ED: TROPONIN I, POC: 0.02 ng/mL (ref 0.00–0.08)

## 2013-07-31 MED ORDER — POTASSIUM CHLORIDE CRYS ER 10 MEQ PO TBCR
10.0000 meq | EXTENDED_RELEASE_TABLET | Freq: Every day | ORAL | Status: DC
  Filled 2013-07-31: qty 1

## 2013-07-31 MED ORDER — OXYCODONE-ACETAMINOPHEN 5-325 MG PO TABS: 1.0000 | ORAL_TABLET | ORAL | Status: DC | PRN

## 2013-07-31 MED ORDER — FUROSEMIDE 10 MG/ML IJ SOLN: 40.0000 mg | Freq: Every day | INTRAMUSCULAR | Status: DC

## 2013-07-31 MED ORDER — FLUTICASONE PROPIONATE HFA 44 MCG/ACT IN AERO
2.0000 | INHALATION_SPRAY | Freq: Two times a day (BID) | RESPIRATORY_TRACT | Status: DC
  Administered 2013-08-01 – 2013-08-09 (×16): 2 via RESPIRATORY_TRACT
  Filled 2013-07-31 (×3): qty 10.6

## 2013-07-31 MED ORDER — ONDANSETRON HCL 4 MG PO TABS: 4.0000 mg | ORAL_TABLET | Freq: Four times a day (QID) | ORAL | Status: DC | PRN

## 2013-07-31 MED ORDER — ALLOPURINOL 100 MG PO TABS
100.0000 mg | ORAL_TABLET | Freq: Every day | ORAL | Status: DC | PRN
  Filled 2013-07-31: qty 1

## 2013-07-31 MED ORDER — FUROSEMIDE 10 MG/ML IJ SOLN: 40.0000 mg | Freq: Two times a day (BID) | INTRAMUSCULAR | Status: DC

## 2013-07-31 MED ORDER — TAMSULOSIN HCL 0.4 MG PO CAPS
0.4000 mg | ORAL_CAPSULE | Freq: Every day | ORAL | Status: DC
  Administered 2013-08-01 – 2013-08-02 (×3): 0.4 mg via ORAL
  Filled 2013-07-31 (×5): qty 1

## 2013-07-31 MED ORDER — EZETIMIBE 10 MG PO TABS
10.0000 mg | ORAL_TABLET | Freq: Every day | ORAL | Status: DC
  Administered 2013-08-01: 10 mg via ORAL
  Filled 2013-07-31 (×3): qty 1

## 2013-07-31 MED ORDER — ATORVASTATIN CALCIUM 20 MG PO TABS
20.0000 mg | ORAL_TABLET | Freq: Every day | ORAL | Status: DC
  Administered 2013-08-01 – 2013-08-10 (×9): 20 mg via ORAL
  Filled 2013-07-31 (×13): qty 1

## 2013-07-31 MED ORDER — IRBESARTAN 75 MG PO TABS
75.0000 mg | ORAL_TABLET | Freq: Every day | ORAL | Status: DC
  Administered 2013-08-01: 75 mg via ORAL
  Filled 2013-07-31 (×3): qty 1

## 2013-07-31 MED ORDER — SODIUM CHLORIDE 0.9 % IJ SOLN: 3.0000 mL | INTRAMUSCULAR | Status: DC | PRN

## 2013-07-31 MED ORDER — SODIUM CHLORIDE 0.9 % IJ SOLN
3.0000 mL | Freq: Two times a day (BID) | INTRAMUSCULAR | Status: DC
  Administered 2013-08-01 (×2): 3 mL via INTRAVENOUS
  Administered 2013-08-01: 20:00:00 via INTRAVENOUS
  Administered 2013-08-01: 3 mL via INTRAVENOUS
  Administered 2013-08-02: 10:00:00 via INTRAVENOUS
  Administered 2013-08-02: 3 mL via INTRAVENOUS
  Administered 2013-08-02: 10:00:00 via INTRAVENOUS
  Administered 2013-08-03 – 2013-08-11 (×16): 3 mL via INTRAVENOUS

## 2013-07-31 MED ORDER — NITROGLYCERIN 0.4 MG SL SUBL: 0.4000 mg | SUBLINGUAL_TABLET | SUBLINGUAL | Status: DC | PRN

## 2013-07-31 MED ORDER — ISOSORBIDE MONONITRATE ER 30 MG PO TB24
30.0000 mg | ORAL_TABLET | Freq: Every day | ORAL | Status: DC
  Filled 2013-07-31: qty 1

## 2013-07-31 MED ORDER — ASPIRIN 81 MG PO CHEW
81.0000 mg | CHEWABLE_TABLET | Freq: Every day | ORAL | Status: DC
  Administered 2013-08-01: 81 mg via ORAL
  Filled 2013-07-31: qty 1

## 2013-07-31 MED ORDER — METOPROLOL TARTRATE 50 MG PO TABS
50.0000 mg | ORAL_TABLET | Freq: Two times a day (BID) | ORAL | Status: DC
  Administered 2013-08-01 (×3): 50 mg via ORAL
  Filled 2013-07-31 (×5): qty 1
  Filled 2013-07-31: qty 2
  Filled 2013-07-31: qty 1

## 2013-07-31 MED ORDER — SODIUM CHLORIDE 0.9 % IJ SOLN
3.0000 mL | Freq: Two times a day (BID) | INTRAMUSCULAR | Status: DC
  Administered 2013-08-01 – 2013-08-03 (×2): 3 mL via INTRAVENOUS
  Administered 2013-08-04: 14:00:00 via INTRAVENOUS
  Administered 2013-08-04 – 2013-08-09 (×5): 3 mL via INTRAVENOUS

## 2013-07-31 MED ORDER — FUROSEMIDE 10 MG/ML IJ SOLN
40.0000 mg | Freq: Once | INTRAMUSCULAR | Status: AC
  Administered 2013-07-31: 40 mg via INTRAVENOUS
  Filled 2013-07-31: qty 4

## 2013-07-31 MED ORDER — ONDANSETRON HCL 4 MG/2ML IJ SOLN
4.0000 mg | Freq: Four times a day (QID) | INTRAMUSCULAR | Status: DC | PRN
  Filled 2013-07-31: qty 2

## 2013-07-31 MED ORDER — SERTRALINE HCL 50 MG PO TABS
50.0000 mg | ORAL_TABLET | Freq: Every day | ORAL | Status: DC
  Administered 2013-08-01 – 2013-08-11 (×10): 50 mg via ORAL
  Filled 2013-07-31 (×15): qty 1

## 2013-07-31 MED ORDER — MONTELUKAST SODIUM 10 MG PO TABS
10.0000 mg | ORAL_TABLET | Freq: Every day | ORAL | Status: DC
  Administered 2013-08-01 – 2013-08-11 (×10): 10 mg via ORAL
  Filled 2013-07-31 (×13): qty 1

## 2013-07-31 MED ORDER — ALBUTEROL SULFATE HFA 108 (90 BASE) MCG/ACT IN AERS: 2.0000 | INHALATION_SPRAY | RESPIRATORY_TRACT | Status: DC | PRN

## 2013-07-31 MED ORDER — ENOXAPARIN SODIUM 40 MG/0.4ML ~~LOC~~ SOLN
40.0000 mg | SUBCUTANEOUS | Status: DC
  Administered 2013-08-01: 40 mg via SUBCUTANEOUS
  Filled 2013-07-31: qty 0.4

## 2013-07-31 MED ORDER — SODIUM CHLORIDE 0.9 % IV SOLN
250.0000 mL | INTRAVENOUS | Status: DC | PRN
  Administered 2013-08-01: 1000 mL via INTRAVENOUS

## 2013-07-31 MED ORDER — ACETAMINOPHEN 500 MG PO TABS
500.0000 mg | ORAL_TABLET | Freq: Four times a day (QID) | ORAL | Status: DC | PRN
  Administered 2013-08-01 – 2013-08-11 (×14): 500 mg via ORAL
  Filled 2013-07-31 (×15): qty 1

## 2013-07-31 NOTE — ED Notes (Signed)
Report given to Brittany, RN.

## 2013-07-31 NOTE — ED Notes (Signed)
Per pt and husband, patient was never off of her oxygen, contradicting what EMS reported.

## 2013-07-31 NOTE — Consult Note (Addendum)
Reason for Consult: shortness of breath, heart failure Referring Physician: Dr. Tawanna Cooler Primary Cardiologist: Dr. Virl Cagey is an 80 y.o. female.  HPI: Laura [name] is an 80 yo woman with PMH of CAD, heart failure, dyslipidemia, hypertension, unknown reason to me for why she is on 2L-3L home oxygen who presented with 10-15 lb weight gain (after a weight loss earlier in the year), worsening shortness of breath, increasing orthopnea, PND, requiring sleep in a recliner (chronic). She has some chills but no infectious symptoms otherwise, no fevers, no known sick contacts or travel. There is a vague story of some presyncopal episode lasting 10-15 seconds 1-2 days prior to presentation at her PCPs office related to lower oxygen saturation. She required as high as 7L in the ER of oxygen to maintain saturations. On my examination, she's intermittently somnolent but easily arousable and had received 40 mg IV lasix with initial UOP just before my arrival. Cardiology asked to consult given significantly elevated BNP, pulmonary edema and what appears to be at least a component of HF. Of note she did have a fairly recent admission for nephrolithiasis. The ER was planning on obtaining of an ABG to evaluate for CO2 narcosis.      Past Medical History  Diagnosis Date  . Coronary artery disease   . Myocardial infarction   . Hypertension   . Arthritis   . Asthma   . Chronic bronchitis   . Urinary tract infection   . Kidney stone   . Hydronephrosis   . Complication of anesthesia   . PONV (postoperative nausea and vomiting)     Past Surgical History  Procedure Laterality Date  . Coronary stent placement    . Appendectomy    . Tonsillectomy    . Knee surgery    . Cystoscopy w/ ureteral stent placement Right 04/04/2013    Procedure: CYSTOSCOPY WITH RETROGRADE PYELOGRAM/URETERAL STENT PLACEMENT;  Surgeon: Molli Hazard, MD;  Location: [facility];  Service: Urology;  Laterality: Right;    . Cystoscopy with retrograde pyelogram, ureteroscopy and stent placement Right 04/30/2013    Procedure: CYSTOSCOPY WITH RETROGRADE PYELOGRAM, URETEROSCOPY AND STENT EXCHANGE;  Surgeon: Molli Hazard, MD;  Location: [facility];  Service: Urology;  Laterality: Right;  . Holmium laser application Right 07/16/8117    Procedure: HOLMIUM LASER APPLICATION;  Surgeon: Molli Hazard, MD;  Location: [facility];  Service: Urology;  Laterality: Right;    Family History  Problem Relation Age of Onset  . Adopted: Yes    Social History:  reports that she has never smoked. She has never used smokeless tobacco. She reports that she does not drink alcohol or use illicit drugs.  Allergies:  Allergies  Allergen Reactions  . Codeine Nausea And Vomiting  . Hydrocodone Other (See Comments)    delusions  . Morphine And Related Other (See Comments)    Extremely cofused    Medications:  I have reviewed the patient's current medications. Prior to Admission:  Prescriptions prior to admission  Medication Sig Dispense Refill  . acetaminophen (TYLENOL) 500 MG tablet Take 500 mg by mouth every 6 (six) hours as needed for mild pain.      Marland Kitchen albuterol (PROVENTIL HFA;VENTOLIN HFA) 108 (90 BASE) MCG/ACT inhaler Inhale 2 puffs into the lungs every 4 (four) hours as needed. For cough or wheezing      . allopurinol (ZYLOPRIM) 100 MG tablet Take 100 mg by mouth daily as needed (gout).      Marland Kitchen aspirin  81 MG tablet Take 81 mg by mouth at bedtime.       . beclomethasone (QVAR) 80 MCG/ACT inhaler Inhale 2 puffs into the lungs 2 (two) times daily.      Marland Kitchen ezetimibe (ZETIA) 10 MG tablet Take 10 mg by mouth daily.      . furosemide (LASIX) 40 MG tablet Take 40 mg by mouth.      . isosorbide mononitrate (IMDUR) 30 MG 24 hr tablet Take 30 mg by mouth daily.      . metoprolol (LOPRESSOR) 100 MG tablet Take 50 mg by mouth 2 (two) times daily.       . montelukast (SINGULAIR) 10 MG tablet Take 10 mg by mouth every morning.        . nitroGLYCERIN (NITROSTAT) 0.4 MG SL tablet Place 0.4 mg under the tongue every 5 (five) minutes as needed for chest pain.      Marland Kitchen ondansetron (ZOFRAN-ODT) 8 MG disintegrating tablet Take 8 mg by mouth every 8 (eight) hours as needed for nausea.       Marland Kitchen oxyCODONE-acetaminophen (PERCOCET) 5-325 MG per tablet Take 1-2 tablets by mouth every 4 (four) hours as needed for severe pain.  360 tablet  0  . potassium chloride (K-DUR,KLOR-CON) 10 MEQ tablet Take 10 mEq by mouth daily.      . rosuvastatin (CRESTOR) 20 MG tablet Take 20 mg by mouth daily with supper.       . sertraline (ZOLOFT) 50 MG tablet Take 50 mg by mouth daily with breakfast.       . tamsulosin (FLOMAX) 0.4 MG CAPS capsule Take 0.4 mg by mouth at bedtime.      . valsartan (DIOVAN) 160 MG tablet Take 160 mg by mouth 2 (two) times daily.      . Vitamin D, Ergocalciferol, (DRISDOL) 50000 UNITS CAPS capsule Take 50,000 Units by mouth every 7 (seven) days.       Scheduled: . aspirin  81 mg Oral QHS  . atorvastatin  20 mg Oral q1800  . enoxaparin (LOVENOX) injection  40 mg Subcutaneous Q24H  . ezetimibe  10 mg Oral Daily  . fluticasone  2 puff Inhalation BID  . furosemide  40 mg Intravenous BID  . irbesartan  75 mg Oral Daily  . isosorbide mononitrate  30 mg Oral Daily  . metoprolol  50 mg Oral BID  . montelukast  10 mg Oral Daily  . potassium chloride  10 mEq Oral Daily  . sertraline  50 mg Oral Q breakfast  . sodium chloride  3 mL Intravenous Q12H  . sodium chloride  3 mL Intravenous Q12H  . tamsulosin  0.4 mg Oral QHS   Continuous:   Results for orders placed during the hospital encounter of 07/31/13 (from the past 48 hour(s))  CBC     Status: Abnormal   Collection Time    07/31/13  5:31 PM      Result Value Ref Range   WBC 8.3  4.0 - 10.5 K/uL   RBC 3.68 (*) 3.87 - 5.11 MIL/uL   Hemoglobin 10.0 (*) 12.0 - 15.0 g/dL   HCT 34.3 (*) 36.0 - 46.0 %   MCV 93.2  78.0 - 100.0 fL   MCH 27.2  26.0 - 34.0 pg   MCHC 29.2 (*)  30.0 - 36.0 g/dL   RDW 14.8  11.5 - 15.5 %   Platelets 106 (*) 150 - 400 K/uL   Comment: REPEATED TO VERIFY     PLATELET  COUNT CONFIRMED BY SMEAR  COMPREHENSIVE METABOLIC PANEL     Status: Abnormal   Collection Time    07/31/13  5:31 PM      Result Value Ref Range   Sodium 143  137 - 147 mEq/L   Potassium 4.2  3.7 - 5.3 mEq/L   Chloride 97  96 - 112 mEq/L   CO2 36 (*) 19 - 32 mEq/L   Glucose, Bld 125 (*) 70 - 99 mg/dL   BUN 15  6 - 23 mg/dL   Creatinine, Ser 0.70  0.50 - 1.10 mg/dL   Calcium 9.2  8.4 - 10.5 mg/dL   Total Protein 7.1  6.0 - 8.3 g/dL   Albumin 3.4 (*) 3.5 - 5.2 g/dL   AST 13  0 - 37 U/L   ALT 9  0 - 35 U/L   Alkaline Phosphatase 65  39 - 117 U/L   Total Bilirubin 0.6  0.3 - 1.2 mg/dL   GFR calc non Af Amer 79 (*) >90 mL/min   GFR calc Af Amer >90  >90 mL/min   Comment: (NOTE)     The eGFR has been calculated using the CKD EPI equation.     This calculation has not been validated in all clinical situations.     eGFR's persistently <90 mL/min signify possible Chronic Kidney     Disease.  PRO B NATRIURETIC PEPTIDE     Status: Abnormal   Collection Time    07/31/13  5:31 PM      Result Value Ref Range   Pro B Natriuretic peptide (BNP) 7176.0 (*) 0 - 450 pg/mL  I-STAT TROPOININ, ED     Status: None   Collection Time    07/31/13  5:51 PM      Result Value Ref Range   Troponin i, poc 0.02  0.00 - 0.08 ng/mL   Comment 3            Comment: Due to the release kinetics of cTnI,     a negative result within the first hours     of the onset of symptoms does not rule out     myocardial infarction with certainty.     If myocardial infarction is still suspected,     repeat the test at appropriate intervals.  URINALYSIS, ROUTINE W REFLEX MICROSCOPIC     Status: Abnormal   Collection Time    07/31/13  6:59 PM      Result Value Ref Range   Color, Urine YELLOW  YELLOW   APPearance CLOUDY (*) CLEAR   Specific Gravity, Urine 1.017  1.005 - 1.030   pH 6.5  5.0 - 8.0    Glucose, UA NEGATIVE  NEGATIVE mg/dL   Hgb urine dipstick NEGATIVE  NEGATIVE   Bilirubin Urine NEGATIVE  NEGATIVE   Ketones, ur NEGATIVE  NEGATIVE mg/dL   Protein, ur 100 (*) NEGATIVE mg/dL   Urobilinogen, UA 1.0  0.0 - 1.0 mg/dL   Nitrite NEGATIVE  NEGATIVE   Leukocytes, UA TRACE (*) NEGATIVE  URINE MICROSCOPIC-ADD ON     Status: Abnormal   Collection Time    07/31/13  6:59 PM      Result Value Ref Range   Squamous Epithelial / LPF RARE  RARE   WBC, UA 3-6  <3 WBC/hpf   Bacteria, UA FEW (*) RARE   Casts HYALINE CASTS (*) NEGATIVE   Urine-Other MUCOUS PRESENT     Comment: AMORPHOUS URATES/PHOSPHATES    Dg Chest [facility]  1 View  07/31/2013   CLINICAL DATA:  Short of breath, near syncope  EXAM: PORTABLE CHEST - 1 VIEW  COMPARISON:  Prior chest x-ray 04/03/2013  FINDINGS: Stable marked cardiomegaly and widening of the mediastinum. Increased pulmonary vascular congestion bordering on edema. Probable bilateral layering pleural effusions and associated bibasilar atelectasis. No acute osseous abnormality.  IMPRESSION: Mild-moderate CHF.  Bilateral layering pleural effusions and associated bibasilar atelectasis versus infiltrate.  Similar appearance of massive cardiomegaly.   Electronically Signed   By: Jacqulynn Cadet M.D.   On: 07/31/2013 18:27    Review of Systems  Constitutional: Positive for chills, weight loss and malaise/fatigue.       Weight loss and weight gain  HENT: Positive for hearing loss. Negative for nosebleeds.   Eyes: Negative for pain and redness.  Respiratory: Positive for shortness of breath. Negative for hemoptysis.   Cardiovascular: Positive for leg swelling. Negative for chest pain.  Gastrointestinal: Positive for heartburn. Negative for abdominal pain and diarrhea.  Genitourinary: Negative for dysuria and hematuria.  Musculoskeletal: Negative for back pain and myalgias.  Skin: Negative for rash.  Neurological: Positive for dizziness and weakness. Negative for  seizures.  Endo/Heme/Allergies: Negative for polydipsia. Bruises/bleeds easily.  Psychiatric/Behavioral: Negative for suicidal ideas, hallucinations and substance abuse.   Blood pressure 136/78, pulse 67, temperature 97.3 F (36.3 C), temperature source Oral, resp. rate 21, SpO2 97.00%. Physical Exam  Nursing note and vitals reviewed. Constitutional: She is oriented to person, place, and time. She appears well-developed and well-nourished. She appears distressed.  HENT:  Head: Normocephalic and atraumatic.  Nose: Nose normal.  Mouth/Throat: Oropharynx is clear and moist. No oropharyngeal exudate.  Eyes: Conjunctivae and EOM are normal. Pupils are equal, round, and reactive to light. No scleral icterus.  Neck: Normal range of motion. Neck supple. JVD present. No tracheal deviation present.  To just under mandible at 60 degrees  Cardiovascular: Normal rate, regular rhythm, normal heart sounds and intact distal pulses.  Exam reveals no gallop.   No murmur heard. Respiratory: She is in respiratory distress. She has rales. She exhibits no tenderness.  GI: Soft. There is no tenderness. There is no rebound.  Musculoskeletal: Normal range of motion. She exhibits edema. She exhibits no tenderness.  Neurological: She is alert and oriented to person, place, and time. No cranial nerve deficit. Coordination normal.  Skin: Skin is warm and dry. No rash noted. She is not diaphoretic. No erythema.  Psychiatric: Thought content normal.  sleepy  labs reviewed; wbc 8.3, h/h 10/34, plt 106, bun/cr 15/0.7, K 4.2, albumin 3.4, ProBNP 7176 Trop 0.02 EKG HR 71, SR Urinalysis with 1.017, ph 6.5, 3-6 wbc Last Echo 2/13: EF 50-55% Chest x-ray: pulmonary edema  Problem List Shortness of Breath Acute on chronic heart failure - diastolic Oxygen requirement - home 2-3L here 5-6L Somnolence  Elevated NT-proBNP Hypertension, Dyslipidemia Pyuria ?  Assessment/Plan: 80 yo woman with PMH of hypertension,  dyslipidemia, CAD, diastolic heart failure/HFpEF who presents with weight gain, pulmonary edema. Differential diagnosis is certainly heart failure, COPD exacerbation, pulmonary hemorrhage, pulmonary embolism, pneumonia or some combination. I favor a diagnosis of heart failure/HFpEF for her shortness of breath/volume overload but feel there could be another component or cause of her HF (PE?) however she is not tachycardia, leading to increased oxygen requirement. Plan for diuresis now but if she does not improve would strongly consider evaluating other avenues through follow-up of ABG results from the ER, CTA, evaluation for other infections, etc.  - IV lasix  60 mg bid recommend - goal 2L negative daily; watch electrolytes - if oxygen requirement improves then PE would move down my list significantly  - agree with continuing home aspirin 81 mg, irbesartan 75 mg, metoprolol tartrate 50 mg bid - repeat Echocardiogram given last in 2013  Laura [name] 07/31/2013, 11:59 PM

## 2013-07-31 NOTE — ED Notes (Signed)
Cardiologist at bedside.  

## 2013-07-31 NOTE — ED Notes (Signed)
Lab at bedside

## 2013-07-31 NOTE — ED Notes (Signed)
Attempted PIV x 2 without success. Paged IV Team for PIV start.

## 2013-07-31 NOTE — H&P (Signed)
[facility] History and Physical  JASLEEN RIEPE DDU:[id number] DOB: 01/20/1930 DOA: 07/31/2013  Referring physician: ED physician PCP: Jerlyn Ly, MD   Chief Complaint: shortness of breath   HPI:  Patient is 80 year old female with history of CHF, presenting to [facility] emergency department with main concern of several days duration of progressively worsening shortness of breath, initially started with exertion and currently present at rest. Please note that most of the history is provided by husband at bedside. Patient takes Lasix 40 mg tablet once daily. She also reports 4 pillow orthopnea, sleeps in a recliner for the past several 3 years. She denies fevers and chills, she has chronic nonproductive cough, she denies any specific abdominal or urinary concerns, no specific chest pain. She plan to see her primary care physician earlier in the day prior to this admission and had sudden onset of syncopal episode that lasted 10 seconds. Her oxygen saturation at that time was in the high 80s. She has required oxygen and was referred to [facility] cone for further evaluation.  In emergency department, patient noted to be slightly somnolent, easy to arouse and follows commands appropriately. Chest x-ray and physical exam findings consistent with CHF. [facility] asked to admit for further evaluation.  Assessment and Plan: Active Problems:   Acute hypoxic respiratory failure - unclear if only diastolic or possibly even systolic congestive heart failure exacerbation - Infiltrate noted as well on chest x-ray and unclear if there is a possibility of an infectious etiology - Patient is afebrile, no leukocytosis on CBC, no tachycardia, no fevers, will therefore defer use of antibiotics at this time - Last 2D echo in 2013 with ejection fraction of 55%, difficult to evaluate for diastolic dysfunction - Will admit to telemetry bed, start patient on Lasix 40 mg IV twice a day - Patient is  currently stable, weight on admission is 206 pounds - Will continue to monitor daily weights, strict intake and output monitoring - Consider cardiology input if no significant clinical improvement over the next 24 hours - Per ED doctor, cardiologist came to examine patient at bedside and thought patient is appropriate for telemetry bed under [facility] service, I'm not sure who was the cardiologist that saw the patient   Acute exacerbation of CHF (congestive heart failure) - Management with Lasix as noted above, followup on 2D ECHO    Accelerated hypertension  - Continue Avapro, metoprolol, Imdur    Hyperlipidemia  - Continue statin   Radiological Exams on Admission: Dg Chest Port 1 View  07/31/2013   CLINICAL DATA:  Short of breath, near syncope  EXAM: PORTABLE CHEST - 1 VIEW  COMPARISON:  Prior chest x-ray 04/03/2013  FINDINGS: Stable marked cardiomegaly and widening of the mediastinum. Increased pulmonary vascular congestion bordering on edema. Probable bilateral layering pleural effusions and associated bibasilar atelectasis. No acute osseous abnormality.  IMPRESSION: Mild-moderate CHF.  Bilateral layering pleural effusions and associated bibasilar atelectasis versus infiltrate.  Similar appearance of massive cardiomegaly.   Electronically Signed   By: Jacqulynn Cadet M.D.   On: 07/31/2013 18:27   Code Status: Full Family Communication: Pt  and husband at bedside Disposition Plan: Admit for further evaluation     Review of Systems:  Constitutional: Negative for fever, chills and malaise/fatigue. Negative for diaphoresis.  HENT: Negative for hearing loss, ear pain, nosebleeds, congestion, sore throat, neck pain, tinnitus and ear discharge.   Eyes: Negative for blurred vision, double vision, photophobia, pain, discharge and redness.  Respiratory: Per history  of present illness.   Cardiovascular: per history of present illness  Gastrointestinal: Negative for nausea, vomiting and  abdominal pain. Negative for heartburn, constipation, blood in stool and melena.  Genitourinary: Negative for dysuria, urgency, frequency, hematuria and flank pain.  Musculoskeletal: Negative for myalgias, back pain, joint pain and falls.  Skin: Negative for itching and rash.  Neurological: Negative for tingling, tremors, sensory change, speech change, focal weakness, loss of consciousness and headaches.  Endo/Heme/Allergies: Negative for environmental allergies and polydipsia. Does not bruise/bleed easily.  Psychiatric/Behavioral: Negative for suicidal ideas. The patient is not nervous/anxious.      Past Medical History  Diagnosis Date  . Coronary artery disease   . Myocardial infarction   . Hypertension   . Arthritis   . Asthma   . Chronic bronchitis   . Urinary tract infection   . Kidney stone   . Hydronephrosis   . Complication of anesthesia   . PONV (postoperative nausea and vomiting)     Past Surgical History  Procedure Laterality Date  . Coronary stent placement    . Appendectomy    . Tonsillectomy    . Knee surgery    . Cystoscopy w/ ureteral stent placement Right 04/04/2013    Procedure: CYSTOSCOPY WITH RETROGRADE PYELOGRAM/URETERAL STENT PLACEMENT;  Surgeon: Molli Hazard, MD;  Location: [facility];  Service: Urology;  Laterality: Right;  . Cystoscopy with retrograde pyelogram, ureteroscopy and stent placement Right 04/30/2013    Procedure: CYSTOSCOPY WITH RETROGRADE PYELOGRAM, URETEROSCOPY AND STENT EXCHANGE;  Surgeon: Molli Hazard, MD;  Location: [facility];  Service: Urology;  Laterality: Right;  . Holmium laser application Right 08/01/9561    Procedure: HOLMIUM LASER APPLICATION;  Surgeon: Molli Hazard, MD;  Location: [facility];  Service: Urology;  Laterality: Right;    Social History:  reports that she has never smoked. She has never used smokeless tobacco. She reports that she does not drink alcohol or use illicit drugs.  Allergies  Allergen  Reactions  . Codeine Nausea And Vomiting  . Hydrocodone Other (See Comments)    delusions  . Morphine And Related Other (See Comments)    Extremely cofused    Family History  Problem Relation Age of Onset  . Adopted: Yes    Prior to Admission medications   Medication Sig Start Date End Date Taking? Authorizing Provider  acetaminophen (TYLENOL) 500 MG tablet Take 500 mg by mouth every 6 (six) hours as needed for mild pain.   Yes Historical Provider, MD  albuterol (PROVENTIL HFA;VENTOLIN HFA) 108 (90 BASE) MCG/ACT inhaler Inhale 2 puffs into the lungs every 4 (four) hours as needed. For cough or wheezing   Yes Historical Provider, MD  allopurinol (ZYLOPRIM) 100 MG tablet Take 100 mg by mouth daily as needed (gout).   Yes Historical Provider, MD  aspirin 81 MG tablet Take 81 mg by mouth at bedtime.    Yes Historical Provider, MD  beclomethasone (QVAR) 80 MCG/ACT inhaler Inhale 2 puffs into the lungs 2 (two) times daily.   Yes Historical Provider, MD  ezetimibe (ZETIA) 10 MG tablet Take 10 mg by mouth daily.   Yes Historical Provider, MD  furosemide (LASIX) 40 MG tablet Take 40 mg by mouth.   Yes Historical Provider, MD  isosorbide mononitrate (IMDUR) 30 MG 24 hr tablet Take 30 mg by mouth daily.   Yes Historical Provider, MD  metoprolol (LOPRESSOR) 100 MG tablet Take 50 mg by mouth 2 (two) times daily.  04/07/13  Yes Orson Eva,  MD  montelukast (SINGULAIR) 10 MG tablet Take 10 mg by mouth every morning.    Yes Historical Provider, MD  nitroGLYCERIN (NITROSTAT) 0.4 MG SL tablet Place 0.4 mg under the tongue every 5 (five) minutes as needed for chest pain.   Yes Historical Provider, MD  ondansetron (ZOFRAN-ODT) 8 MG disintegrating tablet Take 8 mg by mouth every 8 (eight) hours as needed for nausea.  03/31/13  Yes Historical Provider, MD  oxyCODONE-acetaminophen (PERCOCET) 5-325 MG per tablet Take 1-2 tablets by mouth every 4 (four) hours as needed for severe pain. 04/30/13  Yes Tiffany L Reed,  DO  potassium chloride (K-DUR,KLOR-CON) 10 MEQ tablet Take 10 mEq by mouth daily.   Yes Historical Provider, MD  rosuvastatin (CRESTOR) 20 MG tablet Take 20 mg by mouth daily with supper.    Yes Historical Provider, MD  sertraline (ZOLOFT) 50 MG tablet Take 50 mg by mouth daily with breakfast.  04/03/13  Yes Historical Provider, MD  tamsulosin (FLOMAX) 0.4 MG CAPS capsule Take 0.4 mg by mouth at bedtime. 04/07/13  Yes Orson Eva, MD  valsartan (DIOVAN) 160 MG tablet Take 160 mg by mouth 2 (two) times daily.   Yes Historical Provider, MD  Vitamin D, Ergocalciferol, (DRISDOL) 50000 UNITS CAPS capsule Take 50,000 Units by mouth every 7 (seven) days.   Yes Historical Provider, MD    Physical Exam: Filed Vitals:   07/31/13 1845 07/31/13 1930 07/31/13 1933 07/31/13 2000  BP: 113/53 128/65 128/54 136/78  Pulse: 68 68  67  Resp: 21 20 22 21   SpO2: 100% 98% 94% 97%    Physical Exam  Constitutional: Appears well-developed and well-nourished. No distress.  HENT: Normocephalic. External right and left ear normal. Dry mucous membranes  Eyes: Conjunctivae and EOM are normal. PERRLA, no scleral icterus.  Neck: Normal ROM. Neck supple. No JVD. No tracheal deviation. No thyromegaly.  CVS: RRR, S1/S2 +, no murmurs, no gallops, no carotid bruit.  Pulmonary:  crackles bilaterally up to mid lobes, no wheezing  Abdominal: Soft. BS +,  no distension, tenderness, rebound or guarding.  Musculoskeletal: Normal range of motion.   bilateral lower extremity pitting edema Lymphadenopathy: No lymphadenopathy noted, cervical, inguinal. Neuro:  somnolent but easy to arouse, follows commands appropriately. Normal reflexes, muscle tone coordination. No cranial nerve deficit. Skin: Skin is warm and dry. No rash noted. Not diaphoretic. No erythema. No pallor.  Psychiatric: Normal mood and affect. Behavior, judgment, thought content normal.   Labs on Admission:  Basic Metabolic Panel:  Recent Labs Lab 07/31/13 1731   NA 143  K 4.2  CL 97  CO2 36*  GLUCOSE 125*  BUN 15  CREATININE 0.70  CALCIUM 9.2   Liver Function Tests:  Recent Labs Lab 07/31/13 1731  AST 13  ALT 9  ALKPHOS 65  BILITOT 0.6  PROT 7.1  ALBUMIN 3.4*   CBC:  Recent Labs Lab 07/31/13 1731  WBC 8.3  HGB 10.0*  HCT 34.3*  MCV 93.2  PLT 106*   EKG: Pending  Theodis Blaze, MD  [facility] Pager [phone]  If 7PM-7AM, please contact night-coverage [email or web address] Password [facility] 07/31/2013, 10:56 PM

## 2013-07-31 NOTE — ED Notes (Signed)
Report called to floor

## 2013-07-31 NOTE — ED Provider Notes (Signed)
CSN: [id number]     Arrival date & time 07/31/13  1651 History   First MD Initiated Contact with Patient 07/31/13 1653     Chief Complaint  Patient presents with  . Shortness of Breath  . Near Syncope     (Consider location/radiation/quality/duration/timing/severity/associated sxs/prior Treatment) The history is provided by the patient and the spouse.   history of present illness: 80 year-old female with history of CHF who presents with chief complaint of shortness of breath. She has noted progressively worsening shortness of breath with exertion over the course of the past week. She denies history of fever or cough. She reports stable lower extremity edema. No chest pain. Today while at the [facility] office she had a syncopal episode lasting 10-15 seconds after being taken off of her baseline 2.5 L nasal cannula to assess her oxygen saturation off supplemental O2. Patient denies experiencing any prodrome prior to the episode.  Past Medical History  Diagnosis Date  . Coronary artery disease   . Myocardial infarction   . Hypertension   . Arthritis   . Asthma   . Chronic bronchitis   . Urinary tract infection   . Kidney stone   . Hydronephrosis   . Complication of anesthesia   . PONV (postoperative nausea and vomiting)    Past Surgical History  Procedure Laterality Date  . Coronary stent placement    . Appendectomy    . Tonsillectomy    . Knee surgery    . Cystoscopy w/ ureteral stent placement Right 04/04/2013    Procedure: CYSTOSCOPY WITH RETROGRADE PYELOGRAM/URETERAL STENT PLACEMENT;  Surgeon: Molli Hazard, MD;  Location: [facility];  Service: Urology;  Laterality: Right;  . Cystoscopy with retrograde pyelogram, ureteroscopy and stent placement Right 04/30/2013    Procedure: CYSTOSCOPY WITH RETROGRADE PYELOGRAM, URETEROSCOPY AND STENT EXCHANGE;  Surgeon: Molli Hazard, MD;  Location: [facility];  Service: Urology;  Laterality: Right;  . Holmium laser application Right  [id number]    Procedure: HOLMIUM LASER APPLICATION;  Surgeon: Molli Hazard, MD;  Location: [facility];  Service: Urology;  Laterality: Right;   Family History  Problem Relation Age of Onset  . Adopted: Yes   History  Substance Use Topics  . Smoking status: Never Smoker   . Smokeless tobacco: Never Used  . Alcohol Use: No   OB History   Grav Para Term Preterm Abortions TAB SAB Ect Mult Living                 Review of Systems  Constitutional: Negative for fever and chills.  HENT: Negative for congestion.   Eyes: Negative for pain.  Respiratory: Positive for shortness of breath.   Cardiovascular: Positive for leg swelling. Negative for chest pain.  Gastrointestinal: Negative for nausea, vomiting, abdominal pain, diarrhea and constipation.  Genitourinary: Negative for dysuria.  Musculoskeletal: Negative for back pain.  Skin: Negative for rash and wound.  Neurological: Positive for syncope. Negative for headaches.  All other systems reviewed and are negative.     Allergies  Codeine; Hydrocodone; and Morphine and related  Home Medications   Prior to Admission medications   Medication Sig Start Date End Date Taking? Authorizing Provider  albuterol (PROVENTIL HFA;VENTOLIN HFA) 108 (90 BASE) MCG/ACT inhaler Inhale 2 puffs into the lungs every 4 (four) hours as needed. For cough or wheezing    Historical Provider, MD  aspirin 81 MG tablet Take 162 mg by mouth daily.     Historical Provider, MD  ciprofloxacin (CIPRO)  500 MG tablet Take 1 tablet (500 mg total) by mouth 2 (two) times daily. 04/30/13   Molli Hazard, MD  DUREZOL 0.05 % EMUL Place 1 drop into the right eye 2 (two) times daily.  02/02/13   Historical Provider, MD  furosemide (LASIX) 40 MG tablet Take 40 mg by mouth.    Historical Provider, MD  hyoscyamine (LEVSIN, ANASPAZ) 0.125 MG tablet Take 1 tablet (0.125 mg total) by mouth every 4 (four) hours as needed (bladder spasms). 04/30/13   Molli Hazard,  MD  isosorbide mononitrate (IMDUR) 30 MG 24 hr tablet Take 30 mg by mouth daily.    Historical Provider, MD  metoprolol (LOPRESSOR) 100 MG tablet Take 100 mg by mouth 2 (two) times daily. 04/07/13   Orson Eva, MD  mirabegron ER (MYRBETRIQ) 25 MG TB24 tablet Take 25 mg by mouth daily.    Historical Provider, MD  montelukast (SINGULAIR) 10 MG tablet Take 10 mg by mouth at bedtime.    Historical Provider, MD  ondansetron (ZOFRAN-ODT) 8 MG disintegrating tablet Take 8 mg by mouth every 8 (eight) hours as needed for nausea.  03/31/13   Historical Provider, MD  oxybutynin (DITROPAN) 5 MG tablet Take 5 mg by mouth every 6 (six) hours as needed for bladder spasms. 04/07/13   Orson Eva, MD  oxyCODONE-acetaminophen (PERCOCET) 5-325 MG per tablet Take 1-2 tablets by mouth every 4 (four) hours as needed for severe pain. 04/30/13   Tiffany L Reed, DO  phenazopyridine (PYRIDIUM) 100 MG tablet Take 1 tablet (100 mg total) by mouth every 8 (eight) hours as needed for pain (Burning urination.  Will turn urine and body fluids orange.). 04/30/13   Molli Hazard, MD  rosuvastatin (CRESTOR) 20 MG tablet Take 20 mg by mouth daily with supper. Per [facility] Home MAR- taken at [place] Provider, MD  senna-docusate (SENOKOT S) 8.6-50 MG per tablet Take 1 tablet by mouth 2 (two) times daily. 04/30/13   Molli Hazard, MD  sertraline (ZOLOFT) 50 MG tablet Take 50 mg by mouth daily with breakfast.  04/03/13   Historical Provider, MD  tamsulosin (FLOMAX) 0.4 MG CAPS capsule Take 0.4 mg by mouth at bedtime. 04/07/13   Orson Eva, MD  Vitamin D, Ergocalciferol, (DRISDOL) 50000 UNITS CAPS capsule Take 50,000 Units by mouth every 7 (seven) days.    Historical Provider, MD   BP 140/59  Pulse 86  Temp(Src) 98.2 F (36.8 C) (Oral)  Resp 19  Ht 5\' 5"  (1.651 m)  Wt 203 lb 4.2 oz (92.2 kg)  BMI 33.82 kg/m2  SpO2 97% Physical Exam  Nursing note and vitals reviewed. Constitutional: She is oriented to person,  place, and time. She appears well-developed and well-nourished. No distress.  HENT:  Head: Normocephalic and atraumatic.  Eyes: Conjunctivae are normal.  Neck: Neck supple.  Cardiovascular: Normal rate, regular rhythm, normal heart sounds and intact distal pulses.   Pulmonary/Chest: Effort normal. She has decreased breath sounds in the right lower field and the left lower field. She has no wheezes. She has rales in the right lower field and the left lower field.  Abdominal: Soft. She exhibits no distension. There is no tenderness.  Musculoskeletal: Normal range of motion. She exhibits edema (2+ pitting edema lower extremity bilaterally).  Neurological: She is alert and oriented to person, place, and time.  Skin: Skin is warm and dry.    ED Course  Procedures (including critical care time) Labs Review Labs Reviewed  CBC - Abnormal; Notable for the following:    RBC 3.68 (*)    Hemoglobin 10.0 (*)    HCT 34.3 (*)    MCHC 29.2 (*)    Platelets 106 (*)    All other components within normal limits  COMPREHENSIVE METABOLIC PANEL - Abnormal; Notable for the following:    CO2 36 (*)    Glucose, Bld 125 (*)    Albumin 3.4 (*)    GFR calc non Af Amer 79 (*)    All other components within normal limits  PRO B NATRIURETIC PEPTIDE - Abnormal; Notable for the following:    Pro B Natriuretic peptide (BNP) 7176.0 (*)    All other components within normal limits  URINALYSIS, ROUTINE W REFLEX MICROSCOPIC - Abnormal; Notable for the following:    APPearance CLOUDY (*)    Protein, ur 100 (*)    Leukocytes, UA TRACE (*)    All other components within normal limits  URINE MICROSCOPIC-ADD ON - Abnormal; Notable for the following:    Bacteria, UA FEW (*)    Casts HYALINE CASTS (*)    All other components within normal limits  BASIC METABOLIC PANEL  CBC  MAGNESIUM  PHOSPHORUS  TSH  PRO B NATRIURETIC PEPTIDE  I-STAT TROPOININ, ED    Imaging Review Dg Chest Port 1 View  07/31/2013    CLINICAL DATA:  Short of breath, near syncope  EXAM: PORTABLE CHEST - 1 VIEW  COMPARISON:  Prior chest x-ray 04/03/2013  FINDINGS: Stable marked cardiomegaly and widening of the mediastinum. Increased pulmonary vascular congestion bordering on edema. Probable bilateral layering pleural effusions and associated bibasilar atelectasis. No acute osseous abnormality.  IMPRESSION: Mild-moderate CHF.  Bilateral layering pleural effusions and associated bibasilar atelectasis versus infiltrate.  Similar appearance of massive cardiomegaly.   Electronically Signed   By: Jacqulynn Cadet M.D.   On: 07/31/2013 18:27     EKG Interpretation None      Date: 07/31/2013  Rate: 71  Rhythm: normal sinus rhythm  QRS Axis: normal  Intervals: Borderline prolonged PR interval  ST/T Wave abnormalities: nonspecific T wave changes  Conduction Disutrbances:none  Narrative Interpretation: Borderline prolonged PR and non-specific T wave changes inferior  Old EKG Reviewed: unchanged   MDM   Final diagnoses:  Acute exacerbation of CHF (congestive heart failure)    Kataleya B Deakins is a 80 y.o. female with PMSH of CAD, CHF, hypertension, chronic bronchitis here with 1 week of progressively worsening dyspnea on exertion now also short of breath at rest. Oxygen saturation in the 80s on her baseline 2.5 L nasal cannula and improved with 4 L nasal cannula. Slight tachypnea with vital signs otherwise stable. Exam with diminished breath sounds at the bases bilaterally.  No wheezes.  Concern for CHF exacerbation, atypical presentation for ACS. No history of infectious symptoms but will get chest x-ray to violate for pneumonia. No wheezing to suggest COPD exacerbation.  History and exam not consistent with PE.  Per physician's notes, the patient was to decrease her metoprolol by half but continue the same amount of Lasix. Patient thought that she was supposed to decrease her Lasix by half and has been taking the same amount of  metoprolol with a half dose of Lasix.  Labs/Imaging: CBC, CMP, troponin, BNP, EKG, chest x-ray.  Chest x-ray with bilateral pulmonary edema. BNP elevated. Presentation consistent with CHF exacerbation. Giving Lasix 40 mg IV.  Consulted Cardiology who evaluated patient in the emergency department and agreed with diuresis for  acute heart failure exacerbation. They felt patient would be more appropriate for management on a medicine service with her additional comorbidities.  Hospitalist consulted and will do for further management.   Renaldo Reel, MD 08/01/13 [id number]

## 2013-07-31 NOTE — ED Notes (Signed)
Per EMS, pt reporting increase in SOB earlier in the week. Had appointment at [facility] today. States normally wears 2-3L O2 but when she went to her appointment today she didn't wear it. EMS reports off oxygen for approx  2-3 hours. Around 1600 at [facility], staff reported syncopal episode while sitting in waiting room for approx 15 sec. Oxygen 68% at that time. Place on non-breather and O2 came up to 98%. With EMS placed on 4L O2 and oxygen 94%

## 2013-07-31 NOTE — ED Notes (Addendum)
Pt. Oxygen dropped down to 88% 2 1/2 L. Pt. Oxygen increased to 5L. Oxygen now 97%

## 2013-07-31 NOTE — ED Notes (Signed)
Daughter is now reporting [place] took patient off oxygen for approx 2-3 minutes to see how patient tolerated it, and patient dropped to 60s.

## 2013-07-31 NOTE — Progress Notes (Signed)
No ABG to be gotten per MD.

## 2013-07-31 NOTE — ED Notes (Signed)
Introduced self to pt and family at bedside.  Pt position in bed readjusted.  Appears very sleepy but is alert when talking to her.  Denies pain; reports she feels better now than when she arrived. Awaiting pt disposition

## 2013-07-31 NOTE — ED Notes (Signed)
On arrival, patient oxygen 70s on RA. Pt. Placed on 6L O2 and oxygen at 100%. Pt. Alert and oriented x4. Pt. Reports she is "tired". Pt. Falls asleep when not talking to nurse, however is easily aroused.

## 2013-08-01 ENCOUNTER — Inpatient Hospital Stay (HOSPITAL_COMMUNITY): Payer: Medicare Other

## 2013-08-01 ENCOUNTER — Encounter (HOSPITAL_COMMUNITY): Payer: Self-pay | Admitting: *Deleted

## 2013-08-01 DIAGNOSIS — I2510 Atherosclerotic heart disease of native coronary artery without angina pectoris: Secondary | ICD-10-CM

## 2013-08-01 DIAGNOSIS — J9620 Acute and chronic respiratory failure, unspecified whether with hypoxia or hypercapnia: Secondary | ICD-10-CM

## 2013-08-01 DIAGNOSIS — I509 Heart failure, unspecified: Secondary | ICD-10-CM

## 2013-08-01 DIAGNOSIS — I712 Thoracic aortic aneurysm, without rupture, unspecified: Secondary | ICD-10-CM

## 2013-08-01 DIAGNOSIS — I5031 Acute diastolic (congestive) heart failure: Secondary | ICD-10-CM

## 2013-08-01 DIAGNOSIS — R0989 Other specified symptoms and signs involving the circulatory and respiratory systems: Secondary | ICD-10-CM

## 2013-08-01 DIAGNOSIS — R0609 Other forms of dyspnea: Secondary | ICD-10-CM

## 2013-08-01 LAB — POCT I-STAT 3, ART BLOOD GAS (G3+)
ACID-BASE EXCESS: 25 mmol/L — AB (ref 0.0–2.0)
ACID-BASE EXCESS: 22 mmol/L — AB (ref 0.0–2.0)
Acid-Base Excess: 21 mmol/L — ABNORMAL HIGH (ref 0.0–2.0)
Acid-Base Excess: 23 mmol/L — ABNORMAL HIGH (ref 0.0–2.0)
BICARBONATE: 46.1 meq/L — AB (ref 20.0–24.0)
Bicarbonate: 43.2 mEq/L — ABNORMAL HIGH (ref 20.0–24.0)
Bicarbonate: 46.4 mEq/L — ABNORMAL HIGH (ref 20.0–24.0)
Bicarbonate: 45 mEq/L — ABNORMAL HIGH (ref 20.0–24.0)
O2 SAT: 97 %
O2 Saturation: 75 %
O2 Saturation: 100 %
O2 Saturation: 99 %
PH ART: 7.512 — AB (ref 7.350–7.450)
PH ART: 7.709 — AB (ref 7.350–7.450)
PO2 ART: 39 mmHg — AB (ref 80.0–100.0)
PO2 ART: 282 mmHg — AB (ref 80.0–100.0)
PO2 ART: 75 mmHg — AB (ref 80.0–100.0)
Patient temperature: 99.5
TCO2: 48 mmol/L (ref 0–100)
TCO2: 44 mmol/L (ref 0–100)
TCO2: 48 mmol/L (ref 0–100)
TCO2: 46 mmol/L (ref 0–100)
pCO2 arterial: 57.8 mmHg (ref 35.0–45.0)
pCO2 arterial: 32 mmHg — ABNORMAL LOW (ref 35.0–45.0)
pCO2 arterial: 36.9 mmHg (ref 35.0–45.0)
pCO2 arterial: 44.2 mmHg (ref 35.0–45.0)
pH, Arterial: 7.742 (ref 7.350–7.450)
pH, Arterial: 7.617 (ref 7.350–7.450)
pO2, Arterial: 92 mmHg (ref 80.0–100.0)

## 2013-08-01 LAB — BLOOD GAS, ARTERIAL
ACID-BASE EXCESS: 13.8 mmol/L — AB (ref 0.0–2.0)
ACID-BASE EXCESS: 17.2 mmol/L — AB (ref 0.0–2.0)
Acid-Base Excess: 15.1 mmol/L — ABNORMAL HIGH (ref 0.0–2.0)
Bicarbonate: 43.3 mEq/L — ABNORMAL HIGH (ref 20.0–24.0)
Bicarbonate: 41.7 mEq/L — ABNORMAL HIGH (ref 20.0–24.0)
Bicarbonate: 43 mEq/L — ABNORMAL HIGH (ref 20.0–24.0)
DRAWN BY: 13898
DRAWN BY: 13898
Delivery systems: POSITIVE
Drawn by: 13898
Expiratory PAP: 5
FIO2: 100 %
FIO2: 1 %
FIO2: 0.5 %
Inspiratory PAP: 15
LHR: 10 {breaths}/min
O2 SAT: 99.7 %
O2 Saturation: 98.2 %
O2 Saturation: 98.6 %
PATIENT TEMPERATURE: 98.6
PATIENT TEMPERATURE: 99.3
PEEP/CPAP: 5 cmH2O
PH ART: 7.208 — AB (ref 7.350–7.450)
PO2 ART: 217 mmHg — AB (ref 80.0–100.0)
PRESSURE CONTROL: 10 cmH2O
Patient temperature: 97.9
TCO2: 46.8 mmol/L (ref 0–100)
TCO2: 44.9 mmol/L (ref 0–100)
TCO2: 45.2 mmol/L (ref 0–100)
pCO2 arterial: 103 mmHg (ref 35.0–45.0)
pCO2 arterial: 71.8 mmHg (ref 35.0–45.0)
pH, Arterial: 7.227 — ABNORMAL LOW (ref 7.350–7.450)
pH, Arterial: 7.397 (ref 7.350–7.450)
pO2, Arterial: 119 mmHg — ABNORMAL HIGH (ref 80.0–100.0)
pO2, Arterial: 111 mmHg — ABNORMAL HIGH (ref 80.0–100.0)

## 2013-08-01 LAB — IRON AND TIBC
Iron: 110 ug/dL (ref 42–135)
Saturation Ratios: 21 % (ref 20–55)
TIBC: 518 ug/dL — ABNORMAL HIGH (ref 250–470)
UIBC: 408 ug/dL — AB (ref 125–400)

## 2013-08-01 LAB — CBC
HCT: 31.3 % — ABNORMAL LOW (ref 36.0–46.0)
HEMATOCRIT: 31.9 % — AB (ref 36.0–46.0)
HEMOGLOBIN: 9.5 g/dL — AB (ref 12.0–15.0)
Hemoglobin: 8.9 g/dL — ABNORMAL LOW (ref 12.0–15.0)
MCH: 26.4 pg (ref 26.0–34.0)
MCH: 27.3 pg (ref 26.0–34.0)
MCHC: 28.4 g/dL — ABNORMAL LOW (ref 30.0–36.0)
MCHC: 29.8 g/dL — AB (ref 30.0–36.0)
MCV: 92.9 fL (ref 78.0–100.0)
MCV: 91.7 fL (ref 78.0–100.0)
PLATELETS: 111 10*3/uL — AB (ref 150–400)
Platelets: 88 10*3/uL — ABNORMAL LOW (ref 150–400)
RBC: 3.37 MIL/uL — AB (ref 3.87–5.11)
RBC: 3.48 MIL/uL — AB (ref 3.87–5.11)
RDW: 14.7 % (ref 11.5–15.5)
RDW: 14.7 % (ref 11.5–15.5)
WBC: 6.6 10*3/uL (ref 4.0–10.5)
WBC: 7.9 10*3/uL (ref 4.0–10.5)

## 2013-08-01 LAB — GLUCOSE, CAPILLARY
GLUCOSE-CAPILLARY: 86 mg/dL (ref 70–99)
Glucose-Capillary: 105 mg/dL — ABNORMAL HIGH (ref 70–99)
Glucose-Capillary: 118 mg/dL — ABNORMAL HIGH (ref 70–99)

## 2013-08-01 LAB — BASIC METABOLIC PANEL
BUN: 18 mg/dL (ref 6–23)
CALCIUM: 9.5 mg/dL (ref 8.4–10.5)
CO2: 35 meq/L — AB (ref 19–32)
Chloride: 94 mEq/L — ABNORMAL LOW (ref 96–112)
Creatinine, Ser: 0.89 mg/dL (ref 0.50–1.10)
GFR calc non Af Amer: 59 mL/min — ABNORMAL LOW (ref >90)
GFR, EST AFRICAN AMERICAN: 69 mL/min — AB (ref >90)
Glucose, Bld: 93 mg/dL (ref 70–99)
Potassium: 4.4 mEq/L (ref 3.7–5.3)
SODIUM: 145 meq/L (ref 137–147)

## 2013-08-01 LAB — APTT: aPTT: 167 seconds — ABNORMAL HIGH (ref 24–37)

## 2013-08-01 LAB — PROTIME-INR
INR: 1.15 (ref 0.00–1.49)
Prothrombin Time: 14.5 seconds (ref 11.6–15.2)

## 2013-08-01 LAB — MAGNESIUM: Magnesium: 1.8 mg/dL (ref 1.5–2.5)

## 2013-08-01 LAB — PHOSPHORUS: Phosphorus: 2.9 mg/dL (ref 2.3–4.6)

## 2013-08-01 LAB — TROPONIN I: Troponin I: <0.3 ng/mL (ref <0.30)

## 2013-08-01 LAB — RETICULOCYTES
RBC.: 3.58 MIL/uL — AB (ref 3.87–5.11)
RETIC COUNT ABSOLUTE: 114.6 10*3/uL (ref 19.0–186.0)
Retic Ct Pct: 3.2 % — ABNORMAL HIGH (ref 0.4–3.1)

## 2013-08-01 LAB — FERRITIN: Ferritin: 52 ng/mL (ref 10–291)

## 2013-08-01 LAB — VITAMIN B12: Vitamin B-12: 1670 pg/mL — ABNORMAL HIGH (ref 211–911)

## 2013-08-01 LAB — MRSA PCR SCREENING: MRSA by PCR: NEGATIVE

## 2013-08-01 LAB — TSH: TSH: 7.34 u[IU]/mL — AB (ref 0.350–4.500)

## 2013-08-01 LAB — PRO B NATRIURETIC PEPTIDE: PRO B NATRI PEPTIDE: 12114 pg/mL — AB (ref 0–450)

## 2013-08-01 LAB — FOLATE: Folate: >20 ng/mL

## 2013-08-01 MED ORDER — ISOSORBIDE MONONITRATE 10 MG PO TABS
10.0000 mg | ORAL_TABLET | Freq: Two times a day (BID) | ORAL | Status: DC
  Administered 2013-08-01 – 2013-08-02 (×2): 10 mg via ORAL
  Filled 2013-08-01 (×6): qty 1

## 2013-08-01 MED ORDER — HYDRALAZINE HCL 20 MG/ML IJ SOLN
INTRAMUSCULAR | Status: AC
  Filled 2013-08-01: qty 1

## 2013-08-01 MED ORDER — SODIUM CHLORIDE 0.9 % IV SOLN
0.0000 ug/h | INTRAVENOUS | Status: DC
  Administered 2013-08-01: 50 ug/h via INTRAVENOUS
  Filled 2013-08-01 (×2): qty 50

## 2013-08-01 MED ORDER — MIDAZOLAM HCL 2 MG/2ML IJ SOLN
2.0000 mg | Freq: Once | INTRAMUSCULAR | Status: AC
  Administered 2013-08-01: 2 mg via INTRAVENOUS

## 2013-08-01 MED ORDER — FENTANYL CITRATE 0.05 MG/ML IJ SOLN
50.0000 ug | Freq: Once | INTRAMUSCULAR | Status: AC
  Administered 2013-08-01: 50 ug via INTRAVENOUS

## 2013-08-01 MED ORDER — POTASSIUM CHLORIDE 20 MEQ/15ML (10%) PO LIQD
ORAL | Status: AC
  Filled 2013-08-01: qty 15

## 2013-08-01 MED ORDER — VITAL AF 1.2 CAL PO LIQD
1000.0000 mL | ORAL | Status: DC
  Administered 2013-08-01: 1000 mL
  Filled 2013-08-01 (×3): qty 1000

## 2013-08-01 MED ORDER — FAMOTIDINE IN NACL 20-0.9 MG/50ML-% IV SOLN
20.0000 mg | Freq: Two times a day (BID) | INTRAVENOUS | Status: DC
  Administered 2013-08-01 – 2013-08-02 (×4): 20 mg via INTRAVENOUS
  Filled 2013-08-01 (×7): qty 50

## 2013-08-01 MED ORDER — HYDRALAZINE HCL 20 MG/ML IJ SOLN
20.0000 mg | Freq: Once | INTRAMUSCULAR | Status: AC
  Administered 2013-08-01: 20 mg via INTRAVENOUS

## 2013-08-01 MED ORDER — FENTANYL BOLUS VIA INFUSION
25.0000 ug | INTRAVENOUS | Status: DC | PRN
  Administered 2013-08-01: 50 ug via INTRAVENOUS
  Filled 2013-08-01: qty 50

## 2013-08-01 MED ORDER — "THROMBI-PAD 3""X3"" EX PADS"
1.0000 | MEDICATED_PAD | Freq: Once | CUTANEOUS | Status: AC
  Administered 2013-08-01: 1 via TOPICAL
  Filled 2013-08-01 (×2): qty 1

## 2013-08-01 MED ORDER — FENTANYL CITRATE 0.05 MG/ML IJ SOLN
25.0000 ug | INTRAMUSCULAR | Status: DC | PRN
  Administered 2013-08-01 (×3): 50 ug via INTRAVENOUS
  Filled 2013-08-01: qty 2

## 2013-08-01 MED ORDER — FENTANYL CITRATE 0.05 MG/ML IJ SOLN: 50.0000 ug | INTRAMUSCULAR | Status: DC | PRN

## 2013-08-01 MED ORDER — ISOSORBIDE MONONITRATE 10 MG PO TABS
15.0000 mg | ORAL_TABLET | Freq: Two times a day (BID) | ORAL | Status: DC
  Filled 2013-08-01 (×2): qty 1.5

## 2013-08-01 MED ORDER — ETOMIDATE 2 MG/ML IV SOLN
INTRAVENOUS | Status: AC
Start: 2013-08-01 — End: 2013-08-01
  Administered 2013-08-01: 30 mg
  Filled 2013-08-01: qty 20

## 2013-08-01 MED ORDER — LIDOCAINE HCL (CARDIAC) 20 MG/ML IV SOLN
INTRAVENOUS | Status: AC
  Filled 2013-08-01: qty 5

## 2013-08-01 MED ORDER — FUROSEMIDE 10 MG/ML IJ SOLN
60.0000 mg | Freq: Two times a day (BID) | INTRAMUSCULAR | Status: DC
  Administered 2013-08-01 (×2): 60 mg via INTRAVENOUS
  Filled 2013-08-01 (×4): qty 6

## 2013-08-01 MED ORDER — FENTANYL BOLUS VIA INFUSION
50.0000 ug | Freq: Once | INTRAVENOUS | Status: AC
  Administered 2013-08-01: 50 ug via INTRAVENOUS

## 2013-08-01 MED ORDER — MIDAZOLAM HCL 2 MG/2ML IJ SOLN
1.0000 mg | INTRAMUSCULAR | Status: DC | PRN
  Administered 2013-08-01 (×3): 1 mg via INTRAVENOUS
  Filled 2013-08-01 (×4): qty 2

## 2013-08-01 MED ORDER — AMLODIPINE BESYLATE 10 MG PO TABS
10.0000 mg | ORAL_TABLET | Freq: Every day | ORAL | Status: DC
  Administered 2013-08-01: 10 mg via ORAL
  Filled 2013-08-01 (×2): qty 1

## 2013-08-01 MED ORDER — SUCCINYLCHOLINE CHLORIDE 20 MG/ML IJ SOLN
INTRAMUSCULAR | Status: AC
  Filled 2013-08-01: qty 1

## 2013-08-01 MED ORDER — BIOTENE DRY MOUTH MT LIQD
15.0000 mL | Freq: Four times a day (QID) | OROMUCOSAL | Status: DC
  Administered 2013-08-01 – 2013-08-04 (×10): 15 mL via OROMUCOSAL

## 2013-08-01 MED ORDER — DEXMEDETOMIDINE HCL IN NACL 400 MCG/100ML IV SOLN
0.4000 ug/kg/h | INTRAVENOUS | Status: DC
Start: 2013-08-01 — End: 2013-08-02
  Administered 2013-08-01: 0.4 ug/kg/h via INTRAVENOUS
  Administered 2013-08-01: 1.1 ug/kg/h via INTRAVENOUS
  Administered 2013-08-01: 0.4 ug/kg/h via INTRAVENOUS
  Administered 2013-08-01: 1.1 ug/kg/h via INTRAVENOUS
  Administered 2013-08-01: 0.9 ug/kg/h via INTRAVENOUS
  Administered 2013-08-01 (×3): 1.1 ug/kg/h via INTRAVENOUS
  Filled 2013-08-01: qty 100
  Filled 2013-08-01 (×3): qty 50
  Filled 2013-08-01 (×2): qty 100
  Filled 2013-08-01 (×3): qty 50

## 2013-08-01 MED ORDER — FENTANYL CITRATE 0.05 MG/ML IJ SOLN
50.0000 ug | Freq: Once | INTRAMUSCULAR | Status: AC
  Administered 2013-08-01: 50 ug via INTRAVENOUS
  Filled 2013-08-01: qty 2

## 2013-08-01 MED ORDER — CHLORHEXIDINE GLUCONATE 0.12 % MT SOLN
15.0000 mL | Freq: Two times a day (BID) | OROMUCOSAL | Status: DC
  Administered 2013-08-01 – 2013-08-04 (×7): 15 mL via OROMUCOSAL
  Filled 2013-08-01 (×7): qty 15

## 2013-08-01 MED ORDER — SODIUM CHLORIDE 0.9 % IV SOLN
250.0000 mL | INTRAVENOUS | Status: DC
  Administered 2013-08-02: 250 mL via INTRAVENOUS

## 2013-08-01 MED ORDER — FENTANYL CITRATE 0.05 MG/ML IJ SOLN
INTRAMUSCULAR | Status: AC
  Administered 2013-08-01: 50 ug via INTRAVENOUS
  Filled 2013-08-01: qty 2

## 2013-08-01 MED ORDER — PRO-STAT SUGAR FREE PO LIQD
30.0000 mL | Freq: Two times a day (BID) | ORAL | Status: DC
  Administered 2013-08-01 (×2): 30 mL
  Filled 2013-08-01 (×4): qty 30

## 2013-08-01 MED ORDER — ALBUTEROL SULFATE (2.5 MG/3ML) 0.083% IN NEBU
2.5000 mg | INHALATION_SOLUTION | RESPIRATORY_TRACT | Status: DC | PRN
  Administered 2013-08-01: 2.5 mg via RESPIRATORY_TRACT
  Filled 2013-08-01: qty 3

## 2013-08-01 MED ORDER — FUROSEMIDE 10 MG/ML IJ SOLN
INTRAMUSCULAR | Status: AC
  Administered 2013-08-01: 60 mg via INTRAVENOUS
  Filled 2013-08-01: qty 8

## 2013-08-01 MED ORDER — NICARDIPINE HCL IN NACL 20-0.86 MG/200ML-% IV SOLN
3.0000 mg/h | INTRAVENOUS | Status: DC
  Filled 2013-08-01: qty 200

## 2013-08-01 MED ORDER — POTASSIUM CHLORIDE 20 MEQ/15ML (10%) PO LIQD
10.0000 meq | Freq: Every day | ORAL | Status: DC
  Administered 2013-08-01: 10 meq
  Filled 2013-08-01 (×3): qty 7.5

## 2013-08-01 MED ORDER — FENTANYL CITRATE 0.05 MG/ML IJ SOLN: 25.0000 ug | INTRAMUSCULAR | Status: DC | PRN

## 2013-08-01 MED ORDER — ROCURONIUM BROMIDE 50 MG/5ML IV SOLN
INTRAVENOUS | Status: AC
  Filled 2013-08-01: qty 2

## 2013-08-01 MED ORDER — HYDRALAZINE HCL 20 MG/ML IJ SOLN
10.0000 mg | INTRAMUSCULAR | Status: DC | PRN
  Administered 2013-08-01: 20 mg via INTRAVENOUS
  Filled 2013-08-01: qty 1

## 2013-08-01 NOTE — Progress Notes (Signed)
Per MD to hold off on ABG because of vent changes to [facility] and Arterial Line per MD

## 2013-08-01 NOTE — Progress Notes (Signed)
  Echocardiogram 2D Echocardiogram has been performed.  Laura [name] 08/01/2013, 2:55 PM

## 2013-08-01 NOTE — Progress Notes (Signed)
Primary Cardiologist: Dr. Wynonia Lawman   Subjective:  Currently intubated. Mild coughing.  Objective:  Vital Signs in the last 24 hours: Temp:  [97.3 F (36.3 C)-99.5 F (37.5 C)] 99.5 F (37.5 C) (04/25 0800) Pulse Rate:  [67-90] 74 (04/25 0830) Resp:  [13-29] 15 (04/25 0845) BP: (93-228)/(34-163) 141/85 mmHg (04/25 0845) SpO2:  [87 %-100 %] 99 % (04/25 0845) FiO2 (%):  [50 %-100 %] 100 % (04/25 0838) Weight:  [203 lb 4.2 oz (92.2 kg)-205 lb 7.5 oz (93.2 kg)] 205 lb 7.5 oz (93.2 kg) (04/25 0401)  Intake/Output from previous day: 04/24 0701 - 04/25 0700 In: 10 [I.V.:10] Out: -    Physical Exam: General: Well developed, well nourished, ill-appearing, intubated  Head:  Normocephalic and atraumatic. ET tube in place. No significant JVD appreciated Lungs: Coarse breath sounds/vent noise bilaterally. Heart: Normal S1 and S2.  No murmur, rubs or gallops.  Abdomen: soft, non-tender, positive bowel sounds. Extremities: No clubbing or cyanosis. No edema. Neurologic: Alert on ventilator Skin: Warm and dry    Lab Results:  Recent Labs  07/31/13 1731  WBC 8.3  HGB 10.0*  PLT 106*    Recent Labs  07/31/13 1731  NA 143  K 4.2  CL 97  CO2 36*  GLUCOSE 125*  BUN 15  CREATININE 0.70    Hepatic Function Panel  Recent Labs  07/31/13 1731  PROT 7.1  ALBUMIN 3.4*  AST 13  ALT 9  ALKPHOS 65  BILITOT 0.6   ProBNP 7176   Imaging: Dg Chest Port 1 View  08/01/2013   CLINICAL DATA:  Endotracheal tube  EXAM: PORTABLE CHEST - 1 VIEW  COMPARISON:  DG CHEST 1V PORT dated 07/31/2013  FINDINGS: An endotracheal tube has been placed. Tip is about 2.7 cm above the carina. Significant cardiac enlargement persists, with vascular congestion and hazy bibasilar densities suggesting pleural effusions. There remains moderate interstitial prominence suggesting pulmonary edema. Status post intubation, there is improved aeration at both lung bases.  IMPRESSION: Moderate interstitial pulmonary  edema with small bilateral pleural effusions. Significantly improved aeration status post intubation.   Electronically Signed   By: Skipper Cliche M.D.   On: 08/01/2013 07:06   Dg Chest Port 1 View  07/31/2013   CLINICAL DATA:  Short of breath, near syncope  EXAM: PORTABLE CHEST - 1 VIEW  COMPARISON:  Prior chest x-ray 04/03/2013  FINDINGS: Stable marked cardiomegaly and widening of the mediastinum. Increased pulmonary vascular congestion bordering on edema. Probable bilateral layering pleural effusions and associated bibasilar atelectasis. No acute osseous abnormality.  IMPRESSION: Mild-moderate CHF.  Bilateral layering pleural effusions and associated bibasilar atelectasis versus infiltrate.  Similar appearance of massive cardiomegaly.   Electronically Signed   By: Jacqulynn Cadet M.D.   On: 07/31/2013 18:27   Personally viewed.   Telemetry: No adverse arrhythmias Personally viewed.   EKG:  Normal sinus rhythm 71 with nonspecific ST-T wave changes  Cardiac Studies: 05/21/11 Echocardiogram EF 50-55%  . antiseptic oral rinse  15 mL Mouth Rinse QID  . aspirin  81 mg Oral QHS  . atorvastatin  20 mg Oral q1800  . chlorhexidine  15 mL Mouth/Throat BID  . enoxaparin (LOVENOX) injection  40 mg Subcutaneous Q24H  . ezetimibe  10 mg Oral Daily  . famotidine (PEPCID) IV  20 mg Intravenous Q12H  . fluticasone  2 puff Inhalation BID  . furosemide  60 mg Intravenous BID  . irbesartan  75 mg Oral Daily  . isosorbide mononitrate  30  mg Oral Daily  . lidocaine (cardiac) 100 mg/61ml      . metoprolol  50 mg Oral BID  . montelukast  10 mg Oral Daily  . potassium chloride  10 mEq Oral Daily  . rocuronium      . sertraline  50 mg Oral Q breakfast  . sodium chloride  3 mL Intravenous Q12H  . sodium chloride  3 mL Intravenous Q12H  . succinylcholine      . tamsulosin  0.4 mg Oral QHS   Assessment/Plan:   80 year old female with acute respiratory failure, coronary artery disease, diastolic heart  failure acute with weight gain, pulmonary edema, COPD.  1. Acute respiratory failure-empirically giving IV Lasix 60 mg twice a day as there may be a component of acute diastolic heart failure. BNP was in the 7000 range. PCO2 was 103, hypercarbic respiratory failure.  2. Acute diastolic heart failure-prior EF 50%. Continuing with angiotensin receptor blocker irbesartan 75 mg once a day, isosorbide 30 mg once a day, metoprolol 50 mg twice a day. Diuresis. Increase Lasix if necessary. Only recording -147 mL out thus far. Repeat echocardiogram pending.  3. COPD-severe underlying lung disease. Discussed with Dr. Unknown Jim. She is on home oxygen 2-3 L.  4. CAD  5. Thoracic aortic aneurysm-4.3 cm-unchanged. Dr. Franne Grip [facility] 08/01/2013, 8:50 AM

## 2013-08-01 NOTE — Progress Notes (Addendum)
[facility] hospitalist progress note. Chief complaint. Dyspnea, decreased O2 sats. History of present illness This 80 year old female in hospital with acute hypoxic respiratory failure. Etiology unclear but possibly congestive heart failure as well as possible infectious process. Patient declined from using 5 L nasal cannula oxygen to a nonrebreather with O2 sats 94-96%. Despite improving O2 sats she continued demonstrating tachypnea. An arterial blood gas was obtained and this resulted pH 7.208, PCO2 130, PO2 119, bicarbonate 43.3. The patient was moved to step down unit and initiated on BiPAP therapy given her abnormal blood gas. She is somnolent likely secondary to CO2 narcosis. I'm seeing the patient at bedside to assess her stability. Vital signs. Temperature 97.9, pulse 90, respiration 19, blood pressure 130/57. O2 sats 100%. General appearance. Obese elderly female who is very somnolent but in no obvious distress. Cardiac. Rate and rhythm regular. No jugular venous distention or significant edema. Lungs. Poor air movement particularly from midlung to base bilaterally. Patient appears stable on BiPAP with O2 sats now 100%. Extremities. No significant edema. Impression/plan. Problem #1. Acute hypoxic respiratory failure with CO2 retention. Patient clinically looks better on BiPAP with stable O2 sats and no evidence of distress. Ordered a repeat ABG for approximately 2 hours post initiation of BiPAP. If blood gas is not significantly improved on BiPAP her next ABG will need to consider intubation. Nursing will call me this result when available. The patient is full code. I see no clear evidence of congestive heart failure with no evidence of crackles, jugular venous distention, or edema. I will defer additional diuretics at this time.  Addendum: Repeat arterial blood gas resulted pH 7.227, PCO2 103, PO2 217, bicarbonate 41.7. Though improved, arterial blood gas is certainly not close to normalizing.  Patient's level of consciousness continues to be decreased. Given the above results I contacted [facility] and they have kindly consented to consult. We'll follow for their note and recommendations.

## 2013-08-01 NOTE — Procedures (Signed)
Arterial Catheter Insertion Procedure Note Laura [name] [id number] July 10, 1929  Procedure: Insertion of Arterial Catheter  Indications: Blood pressure monitoring and Frequent blood sampling  Procedure Details Consent: Risks of procedure as well as the alternatives and risks of each were explained to the (patient/caregiver).  Consent for procedure obtained. Time Out: Verified patient identification, verified procedure, site/side was marked, verified correct patient position, special equipment/implants available, medications/allergies/relevent history reviewed, required imaging and test results available.  Performed Real time [place] used to ID and cannulate vessel Maximum sterile technique was used including antiseptics, cap, gloves, gown, hand hygiene, mask and sheet. Skin prep: Iodine solution; local anesthetic administered 20 gauge catheter was inserted into right radial artery using the Seldinger technique.  Evaluation Blood flow good; BP tracing good. Complications: No apparent complications.  U/S used in placement  Erick Colace 08/01/2013  I was present and supervised the entire procedure.  Rush Farmer, M.D. [facility] Pulmonary/Critical Care Medicine. Pager: [phone]. After hours pager: [phone].

## 2013-08-01 NOTE — Progress Notes (Signed)
RT attempted ABG and received venous. Will attempt again by another RT to receive arterial blood.

## 2013-08-01 NOTE — Progress Notes (Signed)
Dr. Mauri Brooklyn from [facility] notified via telephone regarding continued moderate amount of bright red blood from right IJ CVC site despite holding pressure x25 minutes. Aware site has been bleeding moderately since approximately 1600 requiring multiple dressing and linen changes and multiple applications of thrombi-pads and attempts to hold manual pressure. Orders received to send CBC, PT/INR, aPTT and to elevate HOB to 45 degrees. Also aware of most recent ABG results with elevated pH and HCO3, orders received for respiratory therapy to change vent settings and repeat ABG in one hour. Will continue to monitor patient closely.

## 2013-08-01 NOTE — Progress Notes (Signed)
INITIAL NUTRITION ASSESSMENT  DOCUMENTATION CODES Per approved criteria  -Obesity Unspecified   INTERVENTION: Initiate Vital AF 1.2 via OGT at 20 ml/hr, advance by 10 ml q 4 hours, to goal of 40 ml/hr. Add 30 ml Prostat liquid protein via tube BID. Goal regimen will provide: 1352 kcal, 102 grams protein, and 779 ml free water. RD to continue to follow nutrition care plan.  NUTRITION DIAGNOSIS: Inadequate oral intake related to inability to eat as evidenced by NPO status.   Goal: Enteral nutrition to provide 60-70% of estimated calorie needs (22-25 kcals/kg ideal body weight) and 100% of estimated protein needs, based on ASPEN guidelines for permissive underfeeding in critically ill obese individuals  Monitor:  weight trends, lab trends, I/O's, TF initiation/tolerance, vent status  Reason for Assessment: MD Consult for Enteral Nutrition Initiation  80 y.o. female  Admitting Dx: acute hypoxic respiratory failure  ASSESSMENT: PMHx significant for CHF, HTN, dyslipidemia. Admitted with worsening SOB x several days. Work-up reveals acute hypoxic respiratory failure and CHF exacerbation.  Per radiology report 4/25 OGT tip projects over the gastric fundus.  Patient is currently intubated on ventilator support MV: 13 L/min Temp (24hrs), Avg:98.2 F (36.8 C), Min:97.3 F (36.3 C), Max:99.5 F (37.5 C)  Propofol: none  RD consulted per PCCM to initiate enteral nutrition. Discussed with RN.  Height: Ht Readings from Last 1 Encounters:  08/01/13 5\' 4"  (1.626 m)    Weight: Wt Readings from Last 1 Encounters:  08/01/13 203 lb 7.8 oz (92.3 kg)    Ideal Body Weight: 120 lb/54.5 kg  % Ideal Body Weight: 169%  Wt Readings from Last 10 Encounters:  08/01/13 203 lb 7.8 oz (92.3 kg)  04/30/13 206 lb (93.441 kg)  04/30/13 206 lb (93.441 kg)  04/04/13 200 lb (90.719 kg)  04/04/13 200 lb (90.719 kg)  09/30/12 195 lb (88.451 kg)  02/19/12 205 lb (92.987 kg)  05/24/11 208 lb 1.8  oz (94.4 kg)    Usual Body Weight: 200 lb  % Usual Body Weight: 101%  BMI:  Body mass index is 34.91 kg/(m^2). Obese Class I  Estimated Nutritional Needs: Kcal: 1910 kcal Underfeeding kcal goal: 1200 - 1360 kcal Protein: at least 109 g daily Fluid: per MD  Skin: stage I pressure ulcer to sacrum  Diet Order: General  EDUCATION NEEDS: -No education needs identified at this time   Intake/Output Summary (Last 24 hours) at 08/01/13 1030 Last data filed at 08/01/13 1000  Gross per 24 hour  Intake 141.58 ml  Output   1275 ml  Net -1133.42 ml    Last BM: PTA  Labs:   Recent Labs Lab 07/31/13 1731  NA 143  K 4.2  CL 97  CO2 36*  BUN 15  CREATININE 0.70  CALCIUM 9.2  GLUCOSE 125*    CBG (last 3)   Recent Labs  08/01/13 0757  GLUCAP 86    Scheduled Meds: . amLODipine  10 mg Oral Daily  . antiseptic oral rinse  15 mL Mouth Rinse QID  . aspirin  81 mg Oral QHS  . atorvastatin  20 mg Oral q1800  . chlorhexidine  15 mL Mouth/Throat BID  . enoxaparin (LOVENOX) injection  40 mg Subcutaneous Q24H  . ezetimibe  10 mg Oral Daily  . famotidine (PEPCID) IV  20 mg Intravenous Q12H  . fluticasone  2 puff Inhalation BID  . furosemide  60 mg Intravenous BID  . irbesartan  75 mg Oral Daily  . isosorbide mononitrate  15  mg Oral BID  . lidocaine (cardiac) 100 mg/33ml      . metoprolol  50 mg Oral BID  . montelukast  10 mg Oral Daily  . potassium chloride  10 mEq Per Tube Daily  . rocuronium      . sertraline  50 mg Oral Q breakfast  . sodium chloride  3 mL Intravenous Q12H  . sodium chloride  3 mL Intravenous Q12H  . succinylcholine      . tamsulosin  0.4 mg Oral QHS    Continuous Infusions: . dexmedetomidine 0.8 mcg/kg/hr (08/01/13 0935)    Past Medical History  Diagnosis Date  . Coronary artery disease   . Myocardial infarction   . Hypertension   . Arthritis   . Asthma   . Chronic bronchitis   . Urinary tract infection   . Kidney stone   .  Hydronephrosis   . Complication of anesthesia   . PONV (postoperative nausea and vomiting)   . Dysrhythmia   . Peripheral vascular disease   . CHF (congestive heart failure)   . Sleep apnea   . Shortness of breath   . Depression     Past Surgical History  Procedure Laterality Date  . Coronary stent placement    . Appendectomy    . Tonsillectomy    . Knee surgery    . Cystoscopy w/ ureteral stent placement Right 04/04/2013    Procedure: CYSTOSCOPY WITH RETROGRADE PYELOGRAM/URETERAL STENT PLACEMENT;  Surgeon: Molli Hazard, MD;  Location: [facility];  Service: Urology;  Laterality: Right;  . Cystoscopy with retrograde pyelogram, ureteroscopy and stent placement Right 04/30/2013    Procedure: CYSTOSCOPY WITH RETROGRADE PYELOGRAM, URETEROSCOPY AND STENT EXCHANGE;  Surgeon: Molli Hazard, MD;  Location: [facility];  Service: Urology;  Laterality: Right;  . Holmium laser application Right 05/11/5425    Procedure: HOLMIUM LASER APPLICATION;  Surgeon: Molli Hazard, MD;  Location: [facility];  Service: Urology;  Laterality: Right;    Inda Coke MS, RD, LDN Inpatient Registered Dietitian Pager: [phone] After-hours pager: [phone]

## 2013-08-01 NOTE — Procedures (Signed)
Central Venous Catheter Insertion Procedure Note Laura [name] [id number] Mar 10, 1930  Procedure: Insertion of Central Venous Catheter Indications: Assessment of intravascular volume, Drug and/or fluid administration and Frequent blood sampling  Procedure Details Consent: Risks of procedure as well as the alternatives and risks of each were explained to the (patient/caregiver).  Consent for procedure obtained. Time Out: Verified patient identification, verified procedure, site/side was marked, verified correct patient position, special equipment/implants available, medications/allergies/relevent history reviewed, required imaging and test results available.  Performed Real time [place] used to ID and cannulate the vessel   Maximum sterile technique was used including antiseptics, cap, gloves, gown, hand hygiene, mask and sheet. Skin prep: Chlorhexidine; local anesthetic administered A antimicrobial bonded/coated triple lumen catheter was placed in the right internal jugular vein using the Seldinger technique.  Evaluation Blood flow good Complications: No apparent complications Patient did tolerate procedure well. Chest X-ray ordered to verify placement.  CXR: pending.  U/S used in placement.  Erick Colace 08/01/2013, 2:01 PM  I was present and supervised the entire procedure.  Rush Farmer, M.D. [facility] Pulmonary/Critical Care Medicine. Pager: [phone]. After hours pager: [phone].

## 2013-08-01 NOTE — H&P (Signed)
Attending addendum: Patient seen and examined, agrees with resident's findings and assesment. Basically hypercarbic resp failure likely to be a combination of OSA + CO2 insensitivity in HF patient, failed BiPAP, with obtundation needing intubation. No evidence of COPD exacerbation from lung mechanics on vent or physical exam. Will try to correct CO2, if patient mental status does not respond despite this measure, will need MRI brain to rule out other less likely causes such as brainstem infarct (currently much less likely). Critical care time 45 minutes  Alric Quan

## 2013-08-01 NOTE — ED Provider Notes (Signed)
I saw and evaluated the patient, reviewed the resident's note and I agree with the findings and plan.   EKG Interpretation   Date/Time:  Friday July 31 2013 17:08:44 EDT Ventricular Rate:  71 PR Interval:  212 QRS Duration: 100 QT Interval:  424 QTC Calculation: 461 R Axis:   72 Text Interpretation:  Sinus rhythm Borderline prolonged PR interval Left  atrial enlargement nonspecific t wave changes Baseline wander No  significant change was found Confirmed by [facility]  MD, TREY ([id number]) on  08/01/2013 3:53:54 PM        Jenny Reichmann Elba Barman III, MD 08/01/13 1556

## 2013-08-01 NOTE — Progress Notes (Signed)
When pt arrived to floor, continued to have resp. Distress even with  [place] 2 5L, 50% venti mask and Non-rebreather mask. Lethargic. Rogue Bussing called and pt transferred to ICU after ABG was performed with critical result.  Husband was notified.

## 2013-08-01 NOTE — Progress Notes (Signed)
PULMONARY / CRITICAL CARE MEDICINE   Name: Laura [name] MRN: [id number] DOB: 06-25-1929    ADMISSION DATE:  07/31/2013 CONSULTATION DATE:  08/01/13  REFERRING MD :  Dr. Doyle Askew PRIMARY SERVICE: PCCM  CHIEF COMPLAINT:  AMS and SOB  BRIEF PATIENT DESCRIPTION:  The patient is a 80 year old female with history of CHF (EF 50 to 55% on 05/21/11), CAD/MI, dyslipidemia, hypertension, asthma, kidney stone, who was admitted for SOB and syncope. Patient was found to be somnolent in ED. Chest x-ray and physical exam findings are consistent with CHF.  ProBNP elevated 7176. Trop negative X 1. PCCM was asked to consult given worsening respiratory status even when patient is on BiPAP.   SIGNIFICANT EVENTS / STUDIES:  CXR 4/24: Increased pulmonary vascular congestion bordering on edema ETT 08/01/13>>> PIV  LINES / TUBES: PIV 07/31/13 >>>  CULTURES: Blood culture 08/01/13>>>  ANTIBIOTICS: none  SUBJECTIVE: Intubated overnight, now on precedex but arousable.  VITAL SIGNS: Temp:  [97.3 F (36.3 C)-99.5 F (37.5 C)] 99.5 F (37.5 C) (04/25 0800) Pulse Rate:  [67-90] 74 (04/25 0830) Resp:  [13-29] 15 (04/25 0845) BP: (93-228)/(34-163) 141/85 mmHg (04/25 0845) SpO2:  [87 %-100 %] 99 % (04/25 0845) FiO2 (%):  [50 %-100 %] 100 % (04/25 0910) Weight:  [203 lb 4.2 oz (92.2 kg)-205 lb 7.5 oz (93.2 kg)] 203 lb 7.8 oz (92.3 kg) (04/25 0923) HEMODYNAMICS:   VENTILATOR SETTINGS: Vent Mode:  [-] PCV FiO2 (%):  [50 %-100 %] 100 % Set Rate:  [16 bmp-20 bmp] 16 bmp Vt Set:  [400 mL] 400 mL PEEP:  [10 cmH20] 10 cmH20 Plateau Pressure:  [22 cmH20-28 cmH20] 28 cmH20 INTAKE / OUTPUT: Intake/Output     04/24 0701 - 04/25 0700 04/25 0701 - 04/26 0700   I.V. (mL/kg) 10 (0.1) 37.3 (0.4)   Total Intake(mL/kg) 10 (0.1) 37.3 (0.4)   Urine (mL/kg/hr)  775 (3.4)   Total Output   775   Net +10 -737.7         PHYSICAL EXAMINATION: General: Sedate but arousable and moving all ext to pain. Neuro:  Moves all  extremities.  HEENT: PERRL, no JVD or carotid bruit Cardiovascular:  Regular rhythm, no Murmur Lungs: Decrease BS and coarse diffusely  Abdomen: soft, distended, no guarding, Bowel sound present Musculoskeletal:  No leg edema Skin: no rashes  LABS:  CBC  Recent Labs Lab 07/31/13 1731  WBC 8.3  HGB 10.0*  HCT 34.3*  PLT 106*   Coag's No results found for this basename: APTT, INR,  in the last 168 hours BMET  Recent Labs Lab 07/31/13 1731  NA 143  K 4.2  CL 97  CO2 36*  BUN 15  CREATININE 0.70  GLUCOSE 125*   Electrolytes  Recent Labs Lab 07/31/13 1731  CALCIUM 9.2   Sepsis Markers No results found for this basename: LATICACIDVEN, PROCALCITON, O2SATVEN,  in the last 168 hours ABG  Recent Labs Lab 08/01/13 0233 08/01/13 0513  PHART 7.208* 7.227*  PCO2ART CRITICAL RESULT CALLED TO, READ BACK BY AND VERIFIED WITH: 103.0*  PO2ART 119.0* 217.0*   Liver Enzymes  Recent Labs Lab 07/31/13 1731  AST 13  ALT 9  ALKPHOS 65  BILITOT 0.6  ALBUMIN 3.4*   Cardiac Enzymes  Recent Labs Lab 07/31/13 1731  PROBNP 7176.0*   Glucose  Recent Labs Lab 08/01/13 0757  GLUCAP 86    Imaging Dg Chest Port 1 View  08/01/2013   CLINICAL DATA:  Endotracheal tube  EXAM: PORTABLE  CHEST - 1 VIEW  COMPARISON:  DG CHEST 1V PORT dated 07/31/2013  FINDINGS: An endotracheal tube has been placed. Tip is about 2.7 cm above the carina. Significant cardiac enlargement persists, with vascular congestion and hazy bibasilar densities suggesting pleural effusions. There remains moderate interstitial prominence suggesting pulmonary edema. Status post intubation, there is improved aeration at both lung bases.  IMPRESSION: Moderate interstitial pulmonary edema with small bilateral pleural effusions. Significantly improved aeration status post intubation.   Electronically Signed   By: Skipper Cliche M.D.   On: 08/01/2013 07:06   Dg Chest Port 1 View  07/31/2013   CLINICAL DATA:  Short  of breath, near syncope  EXAM: PORTABLE CHEST - 1 VIEW  COMPARISON:  Prior chest x-ray 04/03/2013  FINDINGS: Stable marked cardiomegaly and widening of the mediastinum. Increased pulmonary vascular congestion bordering on edema. Probable bilateral layering pleural effusions and associated bibasilar atelectasis. No acute osseous abnormality.  IMPRESSION: Mild-moderate CHF.  Bilateral layering pleural effusions and associated bibasilar atelectasis versus infiltrate.  Similar appearance of massive cardiomegaly.   Electronically Signed   By: Jacqulynn Cadet M.D.   On: 07/31/2013 18:27   ASSESSMENT / PLAN:  PULMONARY A: - Acute hypercapnic respiratory failure-->failed BiPAP due to obesity and heart failure. - Pulmonary edema 2/2 to CHF exacerbation - hx of asthma, but no wheezing-->No exacerbation - OSA?  P:   - Continue albuterol neb prn and sigulair. - Switch to PCV given obstructive lung disease. - Diuresis per card, lasix 60 mg bid.  - ETT and ventilator support, hold PS trials for now. - No steroids for now as no wheezing.  CARDIOVASCULAR A:  - hx of CHF-->now exacerbation. ProBNP 7176. CXR is consistent with CHF exacerbation - HTN: blood pressure well controled - HLD  P:  - Continue ASA, lipitor, Imdur, Metoprolol, irbesantan. - Card input appreciated. - Repeat 2 D echo per cards.  RENAL A:   - No acute issues - Electrolytes OK  P:   - F/U BMP/Mg/Phos. - Replace electrolytes as indicated. - Continue diureses with lasix 60 mg BID.  GASTROINTESTINAL A:   - No acute issues  P:   - GI protection with IV Pepcid. - Start TF.  HEMATOLOGIC A:   - Monocytic anemia. Hgb 11/6 on 04/06/13-->10.0. Unclear etiology P:  - Anemia panel - F/u CBC - Transfuse for Hg <=7.0.  INFECTIOUS A:   - No obvious source of infection. No fever or leukocytosis - UA negative P:   - Blood culture X 2 drawn and NTD - No abx for now.  ENDOCRINE A:   - No hx of DM or thyroid  dysfunction. Last TSH was 2.086 on 05/21/11  P:   - Monitor CBG  NEUROLOGIC A:   - Acute encephalopathy 2/2 to hypercapanic respiratory failure/hypoxia  P:   - D/C neuro checks. - Continue precedex, no need for imaging as patient is much more arousable now that CO2 is decreasing.  CC time 35 min.  Rush Farmer, M.D. [facility] Pulmonary/Critical Care Medicine. Pager: [phone]. After hours pager: [phone].

## 2013-08-01 NOTE — ED Provider Notes (Signed)
I saw and evaluated the patient, reviewed the resident's note and I agree with the findings and plan.   EKG Interpretation   Date/Time:  Friday July 31 2013 17:08:44 EDT Ventricular Rate:  71 PR Interval:  212 QRS Duration: 100 QT Interval:  424 QTC Calculation: 461 R Axis:   72 Text Interpretation:  Sinus rhythm Borderline prolonged PR interval Left  atrial enlargement nonspecific t wave changes Baseline wander No  significant change was found Confirmed by [facility]  MD, TREY ([id number]) on  08/01/2013 3:53:54 PM      80 yo female presenting with SOB and syncope when taken off chronic O2.  On my exam, alert, no respiratory distress on supplemental O2, bibasilar rales, normal heart sounds with RRR, peripheral edema.  Clinical presentation consistent with CHF exacerbation.  Plan admission.    Clinical Impression: 1. Acute exacerbation of CHF (congestive heart failure)      Houston Siren III, MD 08/01/13 [phone]

## 2013-08-01 NOTE — Procedures (Signed)
Intubation Procedure Note Laura [name] [id number] 21-Mar-1930  Procedure: Intubation Indications: Respiratory insufficiency  Procedure Details Consent: Unable to obtain consent because of altered level of consciousness. Time Out: Verified patient identification, verified procedure, site/side was marked, verified correct patient position, special equipment/implants available, medications/allergies/relevent history reviewed, required imaging and test results available.  Performed  MAC   Evaluation Hemodynamic Status: BP stable throughout; O2 sats: stable throughout Patient's Current Condition: stable Complications: No apparent complications Patient did tolerate procedure well. Chest X-ray ordered to verify placement.  CXR: pending. Operators of the procedure: Freida Busman Nyaira Hodgens and Parsons 08/01/2013

## 2013-08-01 NOTE — H&P (Signed)
PULMONARY / CRITICAL CARE MEDICINE   Name: Laura [name] MRN: [id number] DOB: 06-23-1929    ADMISSION DATE:  07/31/2013 CONSULTATION DATE:  08/01/13  REFERRING MD :  Dr. Doyle Askew PRIMARY SERVICE: PCCM  CHIEF COMPLAINT:  AMS and SOB  BRIEF PATIENT DESCRIPTION:  The patient is a 80 year old female with history of CHF (EF 50 to 55% on 05/21/11), CAD/MI, dyslipidemia, hypertension, asthma, kidney stone, who was admitted for SOB and syncope. Patient was found to be somnolent in ED. Chest x-ray and physical exam findings are consistent with CHF.  ProBNP elevated 7176. Trop negative X 1. PCCM was asked to consult given worsening respiratory status even when patient is on BiPAP.   SIGNIFICANT EVENTS / STUDIES:  -- CXR 4/24: Increased pulmonary vascular congestion bordering on edema -- ETT 08/01/13>>>  LINES / TUBES: PIV 07/31/13 >>>  CULTURES: Blood culture 08/01/13>>>  ANTIBIOTICS: none  HISTORY OF PRESENT ILLNESS:    The patient is a 80 year old female with history of CHF (EF 50 to 55% on 05/21/11), CAD/MI, dyslipidemia, hypertension, asthma, kidney stone, who was admitted for SOB and syncope.  Patient has been taking lasix at home. In the past several days, she developed progressively worsening shortness of breath. No fever, chest pain. She had to use 4 pillow to sleep in the night due to orthopnea. She also had an sudden onset of syncopal episode and oxygen saturation to 80s. Patient was found to be somnolent in ED. Chest x-ray and physical exam findings are consistent with CHF.  ProBNP elevated 7176. Trop negative X 1. PCCM was asked to consult given worsening respiratory status even when patient is on BiPAP.   PAST MEDICAL HISTORY :  Past Medical History  Diagnosis Date  . Coronary artery disease   . Myocardial infarction   . Hypertension   . Arthritis   . Asthma   . Chronic bronchitis   . Urinary tract infection   . Kidney stone   . Hydronephrosis   . Complication of  anesthesia   . PONV (postoperative nausea and vomiting)    Past Surgical History  Procedure Laterality Date  . Coronary stent placement    . Appendectomy    . Tonsillectomy    . Knee surgery    . Cystoscopy w/ ureteral stent placement Right 04/04/2013    Procedure: CYSTOSCOPY WITH RETROGRADE PYELOGRAM/URETERAL STENT PLACEMENT;  Surgeon: Molli Hazard, MD;  Location: [facility];  Service: Urology;  Laterality: Right;  . Cystoscopy with retrograde pyelogram, ureteroscopy and stent placement Right 04/30/2013    Procedure: CYSTOSCOPY WITH RETROGRADE PYELOGRAM, URETEROSCOPY AND STENT EXCHANGE;  Surgeon: Molli Hazard, MD;  Location: [facility];  Service: Urology;  Laterality: Right;  . Holmium laser application Right 6/76/1950    Procedure: HOLMIUM LASER APPLICATION;  Surgeon: Molli Hazard, MD;  Location: [facility];  Service: Urology;  Laterality: Right;   Prior to Admission medications   Medication Sig Start Date End Date Taking? Authorizing Provider  acetaminophen (TYLENOL) 500 MG tablet Take 500 mg by mouth every 6 (six) hours as needed for mild pain.   Yes Historical Provider, MD  albuterol (PROVENTIL HFA;VENTOLIN HFA) 108 (90 BASE) MCG/ACT inhaler Inhale 2 puffs into the lungs every 4 (four) hours as needed. For cough or wheezing   Yes Historical Provider, MD  allopurinol (ZYLOPRIM) 100 MG tablet Take 100 mg by mouth daily as needed (gout).   Yes Historical Provider, MD  aspirin 81 MG tablet Take 81 mg by mouth at bedtime.  Yes Historical Provider, MD  beclomethasone (QVAR) 80 MCG/ACT inhaler Inhale 2 puffs into the lungs 2 (two) times daily.   Yes Historical Provider, MD  ezetimibe (ZETIA) 10 MG tablet Take 10 mg by mouth daily.   Yes Historical Provider, MD  furosemide (LASIX) 40 MG tablet Take 40 mg by mouth.   Yes Historical Provider, MD  isosorbide mononitrate (IMDUR) 30 MG 24 hr tablet Take 30 mg by mouth daily.   Yes Historical Provider, MD  metoprolol (LOPRESSOR) 100  MG tablet Take 50 mg by mouth 2 (two) times daily.  04/07/13  Yes Orson Eva, MD  montelukast (SINGULAIR) 10 MG tablet Take 10 mg by mouth every morning.    Yes Historical Provider, MD  nitroGLYCERIN (NITROSTAT) 0.4 MG SL tablet Place 0.4 mg under the tongue every 5 (five) minutes as needed for chest pain.   Yes Historical Provider, MD  ondansetron (ZOFRAN-ODT) 8 MG disintegrating tablet Take 8 mg by mouth every 8 (eight) hours as needed for nausea.  03/31/13  Yes Historical Provider, MD  oxyCODONE-acetaminophen (PERCOCET) 5-325 MG per tablet Take 1-2 tablets by mouth every 4 (four) hours as needed for severe pain. 04/30/13  Yes Tiffany L Reed, DO  potassium chloride (K-DUR,KLOR-CON) 10 MEQ tablet Take 10 mEq by mouth daily.   Yes Historical Provider, MD  rosuvastatin (CRESTOR) 20 MG tablet Take 20 mg by mouth daily with supper.    Yes Historical Provider, MD  sertraline (ZOLOFT) 50 MG tablet Take 50 mg by mouth daily with breakfast.  04/03/13  Yes Historical Provider, MD  tamsulosin (FLOMAX) 0.4 MG CAPS capsule Take 0.4 mg by mouth at bedtime. 04/07/13  Yes Orson Eva, MD  valsartan (DIOVAN) 160 MG tablet Take 160 mg by mouth 2 (two) times daily.   Yes Historical Provider, MD  Vitamin D, Ergocalciferol, (DRISDOL) 50000 UNITS CAPS capsule Take 50,000 Units by mouth every 7 (seven) days.   Yes Historical Provider, MD   Allergies  Allergen Reactions  . Codeine Nausea And Vomiting  . Hydrocodone Other (See Comments)    delusions  . Morphine And Related Other (See Comments)    Extremely cofused    FAMILY HISTORY:  Family History  Problem Relation Age of Onset  . Adopted: Yes   SOCIAL HISTORY:  reports that she has never smoked. She has never used smokeless tobacco. She reports that she does not drink alcohol or use illicit drugs.  REVIEW OF SYSTEMS:  Patient has AMS, not arousable,  could not do ROS  SUBJECTIVE:   VITAL SIGNS: Temp:  [97.3 F (36.3 C)-98.2 F (36.8 C)] 97.9 F (36.6 C)  (04/25 0350) Pulse Rate:  [67-90] 90 (04/25 0342) Resp:  [13-26] 13 (04/25 0500) BP: (93-148)/(34-99) 109/38 mmHg (04/25 0500) SpO2:  [94 %-100 %] 100 % (04/25 0500) FiO2 (%):  [50 %] 50 % (04/25 0119) Weight:  [203 lb 4.2 oz (92.2 kg)-205 lb 7.5 oz (93.2 kg)] 205 lb 7.5 oz (93.2 kg) (04/25 0401) HEMODYNAMICS:   VENTILATOR SETTINGS: Vent Mode:  [-]  FiO2 (%):  [50 %] 50 % INTAKE / OUTPUT: Intake/Output   None    PHYSICAL EXAMINATION: General:  AMS, not reponsive Neuro:  Moves all extremities.  HEENT: PERRL, no JVD or carotid bruit Cardiovascular:  Regular rhythm, no Murmur Lungs: decreased air movement bilaterally (L>R), coarse breathing sound bilaterally. No wheezing or crackles Abdomen: soft, distended, no guarding, Bowel sound present Musculoskeletal:  No leg edema Skin: no rashes  LABS:  CBC  Recent  Labs Lab 07/31/13 1731  WBC 8.3  HGB 10.0*  HCT 34.3*  PLT 106*   Coag's No results found for this basename: APTT, INR,  in the last 168 hours BMET  Recent Labs Lab 07/31/13 1731  NA 143  K 4.2  CL 97  CO2 36*  BUN 15  CREATININE 0.70  GLUCOSE 125*   Electrolytes  Recent Labs Lab 07/31/13 1731  CALCIUM 9.2   Sepsis Markers No results found for this basename: LATICACIDVEN, PROCALCITON, O2SATVEN,  in the last 168 hours ABG  Recent Labs Lab 08/01/13 0233 08/01/13 0513  PHART 7.208* 7.227*  PCO2ART CRITICAL RESULT CALLED TO, READ BACK BY AND VERIFIED WITH: 103.0*  PO2ART 119.0* 217.0*   Liver Enzymes  Recent Labs Lab 07/31/13 1731  AST 13  ALT 9  ALKPHOS 65  BILITOT 0.6  ALBUMIN 3.4*   Cardiac Enzymes  Recent Labs Lab 07/31/13 1731  PROBNP 7176.0*   Glucose No results found for this basename: GLUCAP,  in the last 168 hours  Imaging Dg Chest Port 1 View  07/31/2013   CLINICAL DATA:  Short of breath, near syncope  EXAM: PORTABLE CHEST - 1 VIEW  COMPARISON:  Prior chest x-ray 04/03/2013  FINDINGS: Stable marked cardiomegaly and  widening of the mediastinum. Increased pulmonary vascular congestion bordering on edema. Probable bilateral layering pleural effusions and associated bibasilar atelectasis. No acute osseous abnormality.  IMPRESSION: Mild-moderate CHF.  Bilateral layering pleural effusions and associated bibasilar atelectasis versus infiltrate.  Similar appearance of massive cardiomegaly.   Electronically Signed   By: Jacqulynn Cadet M.D.   On: 07/31/2013 18:27     ASSESSMENT / PLAN:  PULMONARY A: - Acute hypercapnic respiratory failure-->failed BiPAP - Pulmonary edema 2/2 to CHF exacerbation - hx of asthma, but no wheezing-->No exacerbation - OSA?  P:   - continue albuterol neb prn and sigulair - diuresis per card, lasix 60 mg bid.  - ETT and ventilator support  CARDIOVASCULAR A:  - hx of CHF-->now exacerbation. ProBNP 7176. CXR is consistent with CHF exacerbation - HTN: blood pressure well controled - HLD  P:  - continue ASA, lipitor, Imdur, Metoprolol, irbesantan - Card on board  - repeat 2 D echo - repeat trop x 1   RENAL A:   -  No acute issues -  electrolytes OK  P:   - f/u BMP/Mg/Phos  GASTROINTESTINAL A:   - No acute issues  P:   - GI protection with IV Pepcid  HEMATOLOGIC A:   - Monocytic anemia. Hgb 11/6 on 04/06/13-->10.0. Unclear etiology P:  - check anemia panel - f/u CBC  INFECTIOUS A:   - No obvious source of infection. No fever or leukocytosis - UA negative P:   - get blood culture X 2 given AMS  ENDOCRINE A:   - No hx of DM or thyroid dysfunction. Last TSH was 2.086 on 05/21/11  P:   - Monitor CBG - check TSH   NEUROLOGIC A:   - Acute encephalopathy 2/2 to hypercapanic respiratory failure/hypoxia - moves all extremities, but can not r/o intracranial issue at this moment  P:   - neuro check q2h - low shreshold for MRI-head if no improving   Ivor Costa, MD PGY3, Internal Medicine Teaching Service Pager: [phone]   Pulmonary and [place] Pager: [phone]  08/01/2013, 6:16 AM

## 2013-08-01 NOTE — Progress Notes (Signed)
Called bedside for bleeding on her R IJ site despite application of thrombin patch. I applied some pressure for a few minutes then I asked for some ice pack from the nurse, I continued holding pressure with the ice pack for about 5 minutes and the bleeding has stopped. I dropped by to check on the patient again after about 45 minutes and still no more bleeding. Stopped ASA and lovenox for now. Pending result PT and aPTT.

## 2013-08-02 ENCOUNTER — Inpatient Hospital Stay (HOSPITAL_COMMUNITY): Payer: Medicare Other

## 2013-08-02 DIAGNOSIS — I5031 Acute diastolic (congestive) heart failure: Secondary | ICD-10-CM

## 2013-08-02 DIAGNOSIS — E785 Hyperlipidemia, unspecified: Secondary | ICD-10-CM

## 2013-08-02 DIAGNOSIS — N179 Acute kidney failure, unspecified: Secondary | ICD-10-CM

## 2013-08-02 DIAGNOSIS — R0902 Hypoxemia: Secondary | ICD-10-CM

## 2013-08-02 LAB — BLOOD GAS, ARTERIAL
Acid-Base Excess: 15 mmol/L — ABNORMAL HIGH (ref 0.0–2.0)
BICARBONATE: 40 meq/L — AB (ref 20.0–24.0)
Drawn by: 13898
FIO2: 0.5 %
O2 Saturation: 97.7 %
PCO2 ART: 57.8 mmHg — AB (ref 35.0–45.0)
PEEP: 5 cmH2O
PH ART: 7.453 — AB (ref 7.350–7.450)
Patient temperature: 97.8
Pressure control: 10 cmH2O
RATE: 10 resp/min
TCO2: 41.9 mmol/L (ref 0–100)
pO2, Arterial: 92 mmHg (ref 80.0–100.0)

## 2013-08-02 LAB — POCT I-STAT 3, ART BLOOD GAS (G3+)
Acid-Base Excess: 11 mmol/L — ABNORMAL HIGH (ref 0.0–2.0)
BICARBONATE: 40.2 meq/L — AB (ref 20.0–24.0)
O2 Saturation: 94 %
TCO2: 43 mmol/L (ref 0–100)
pCO2 arterial: 84.8 mmHg (ref 35.0–45.0)
pH, Arterial: 7.284 — ABNORMAL LOW (ref 7.350–7.450)
pO2, Arterial: 86 mmHg (ref 80.0–100.0)

## 2013-08-02 LAB — DIFFERENTIAL
Basophils Absolute: 0 10*3/uL (ref 0.0–0.1)
Basophils Relative: 0 % (ref 0–1)
EOS ABS: 0.1 10*3/uL (ref 0.0–0.7)
EOS PCT: 1 % (ref 0–5)
Lymphocytes Relative: 8 % — ABNORMAL LOW (ref 12–46)
Lymphs Abs: 0.7 10*3/uL (ref 0.7–4.0)
MONOS PCT: 8 % (ref 3–12)
Monocytes Absolute: 0.7 10*3/uL (ref 0.1–1.0)
Neutro Abs: 7.7 10*3/uL (ref 1.7–7.7)
Neutrophils Relative %: 83 % — ABNORMAL HIGH (ref 43–77)

## 2013-08-02 LAB — URINE MICROSCOPIC-ADD ON

## 2013-08-02 LAB — CBC
HCT: 29.3 % — ABNORMAL LOW (ref 36.0–46.0)
Hemoglobin: 8.7 g/dL — ABNORMAL LOW (ref 12.0–15.0)
MCH: 26.9 pg (ref 26.0–34.0)
MCHC: 29.7 g/dL — AB (ref 30.0–36.0)
MCV: 90.7 fL (ref 78.0–100.0)
PLATELETS: 98 10*3/uL — AB (ref 150–400)
RBC: 3.23 MIL/uL — AB (ref 3.87–5.11)
RDW: 15.1 % (ref 11.5–15.5)
WBC: 9.1 10*3/uL (ref 4.0–10.5)

## 2013-08-02 LAB — BASIC METABOLIC PANEL
BUN: 32 mg/dL — ABNORMAL HIGH (ref 6–23)
CO2: 37 meq/L — AB (ref 19–32)
Calcium: 8.3 mg/dL — ABNORMAL LOW (ref 8.4–10.5)
Chloride: 94 mEq/L — ABNORMAL LOW (ref 96–112)
Creatinine, Ser: 1.05 mg/dL (ref 0.50–1.10)
GFR calc Af Amer: 56 mL/min — ABNORMAL LOW (ref >90)
GFR, EST NON AFRICAN AMERICAN: 48 mL/min — AB (ref >90)
Glucose, Bld: 130 mg/dL — ABNORMAL HIGH (ref 70–99)
Potassium: 3.2 mEq/L — ABNORMAL LOW (ref 3.7–5.3)
Sodium: 143 mEq/L (ref 137–147)

## 2013-08-02 LAB — GLUCOSE, CAPILLARY
GLUCOSE-CAPILLARY: 97 mg/dL (ref 70–99)
GLUCOSE-CAPILLARY: 119 mg/dL — AB (ref 70–99)
Glucose-Capillary: 106 mg/dL — ABNORMAL HIGH (ref 70–99)
Glucose-Capillary: 122 mg/dL — ABNORMAL HIGH (ref 70–99)
Glucose-Capillary: 126 mg/dL — ABNORMAL HIGH (ref 70–99)
Glucose-Capillary: 136 mg/dL — ABNORMAL HIGH (ref 70–99)
Glucose-Capillary: 140 mg/dL — ABNORMAL HIGH (ref 70–99)

## 2013-08-02 LAB — URINALYSIS, ROUTINE W REFLEX MICROSCOPIC
BILIRUBIN URINE: NEGATIVE
Glucose, UA: NEGATIVE mg/dL
KETONES UR: NEGATIVE mg/dL
Nitrite: NEGATIVE
PROTEIN: NEGATIVE mg/dL
Specific Gravity, Urine: 1.016 (ref 1.005–1.030)
UROBILINOGEN UA: 1 mg/dL (ref 0.0–1.0)
pH: 6.5 (ref 5.0–8.0)

## 2013-08-02 LAB — T3: T3 TOTAL: 80.6 ng/dL (ref 80.0–204.0)

## 2013-08-02 LAB — APTT: aPTT: 39 seconds — ABNORMAL HIGH (ref 24–37)

## 2013-08-02 LAB — T4, FREE: Free T4: 1.2 ng/dL (ref 0.80–1.80)

## 2013-08-02 LAB — LACTIC ACID, PLASMA: Lactic Acid, Venous: 0.9 mmol/L (ref 0.5–2.2)

## 2013-08-02 LAB — PROCALCITONIN: Procalcitonin: 0.63 ng/mL

## 2013-08-02 LAB — T3, FREE: T3, Free: 2.3 pg/mL (ref 2.3–4.2)

## 2013-08-02 LAB — PROTIME-INR
INR: 1.09 (ref 0.00–1.49)
Prothrombin Time: 13.9 seconds (ref 11.6–15.2)

## 2013-08-02 LAB — PHOSPHORUS: PHOSPHORUS: 4 mg/dL (ref 2.3–4.6)

## 2013-08-02 LAB — MAGNESIUM: MAGNESIUM: 1.7 mg/dL (ref 1.5–2.5)

## 2013-08-02 MED ORDER — METOPROLOL TARTRATE 1 MG/ML IV SOLN: 5.0000 mg | Freq: Once | INTRAVENOUS | Status: DC

## 2013-08-02 MED ORDER — METOPROLOL TARTRATE 1 MG/ML IV SOLN
5.0000 mg | Freq: Four times a day (QID) | INTRAVENOUS | Status: DC | PRN
  Administered 2013-08-02: 5 mg via INTRAVENOUS
  Filled 2013-08-02: qty 5

## 2013-08-02 MED ORDER — SODIUM CHLORIDE 0.9 % IV BOLUS (SEPSIS)
500.0000 mL | Freq: Once | INTRAVENOUS | Status: AC
  Administered 2013-08-02: 500 mL via INTRAVENOUS

## 2013-08-02 MED ORDER — POTASSIUM CHLORIDE 20 MEQ/15ML (10%) PO LIQD
40.0000 meq | Freq: Once | ORAL | Status: AC
  Administered 2013-08-02: 40 meq
  Filled 2013-08-02: qty 30

## 2013-08-02 MED ORDER — FUROSEMIDE 10 MG/ML IJ SOLN
40.0000 mg | Freq: Once | INTRAMUSCULAR | Status: AC
  Administered 2013-08-02: 40 mg via INTRAVENOUS

## 2013-08-02 MED ORDER — METOPROLOL TARTRATE 1 MG/ML IV SOLN: 5.0000 mg | Freq: Four times a day (QID) | INTRAVENOUS | Status: DC

## 2013-08-02 MED ORDER — FUROSEMIDE 8 MG/ML PO SOLN
40.0000 mg | Freq: Every day | ORAL | Status: DC
  Filled 2013-08-02 (×2): qty 5

## 2013-08-02 MED ORDER — SODIUM CHLORIDE 0.9 % IV SOLN
INTRAVENOUS | Status: AC
  Administered 2013-08-02: 16:00:00 via INTRAVENOUS

## 2013-08-02 MED ORDER — MAGNESIUM SULFATE 40 MG/ML IJ SOLN
2.0000 g | Freq: Once | INTRAMUSCULAR | Status: AC
  Administered 2013-08-02: 2 g via INTRAVENOUS
  Filled 2013-08-02: qty 50

## 2013-08-02 NOTE — ED Notes (Addendum)
Admitting physician stating patient does not need ABG at this time and patient is stable to move to floor.

## 2013-08-02 NOTE — Progress Notes (Signed)
eLink Physician-Brief Progress Note Patient Name: Laura [name] DOB: 02-20-1930 MRN: [id number]  Date of Service  08/02/2013   HPI/Events of Note   Patient extubated earlier today.  Nurse called b/c of lethargy and hypoxemia.  Patient with increased work of breathing.  Stat ABG showed resp acidosis with PH 7.2 and PCO2 of 84.  CXR this am with signs of edema.  eICU Interventions  Lasix 40 mg IV x 1. Bipap Repeat abg in 2 hrs Saturation quickly recovered with increasing from 3 liters to 5 liters by [place]   Intervention Category Major Interventions: Hypoxemia - evaluation and management  Melvia Heaps 08/02/2013, 3:49 PM

## 2013-08-02 NOTE — Progress Notes (Signed)
Pt pulled feeding tube, will wait to find out if extubation is possible, before reinserting

## 2013-08-02 NOTE — Progress Notes (Signed)
29ml fentanyl wasted in sink,wit 2RN

## 2013-08-02 NOTE — Procedures (Signed)
Extubation Procedure Note  Patient Details:   Name: Laura [name] DOB: [birth year] MRN: [id number]   Airway Documentation:     Evaluation  O2 sats: stable throughout Complications: No apparent complications Patient did tolerate procedure well. Bilateral Breath Sounds: Diminished;Rhonchi Suctioning: Airway Yes  Pt extubated per Dr. Nelda Marseille was present at bedside.  Pt placed on 2l [place]. With sats of 95%.  RT will continue to monitor.  Deetta Perla Closson 08/02/2013, 7:05 PM

## 2013-08-02 NOTE — Progress Notes (Signed)
eLink Physician-Brief Progress Note Patient Name: Laura [name] DOB: [birth year] MRN: [id number]  Date of Service  08/02/2013   HPI/Events of Note  Patient with CHF s/p diuresis of greater than 3 liters in 24 hours on several antihypertensive/cardiac meds now with BP of 74/35.  Bedside nurse has stopped sedation and monitored with no improvement in BP. CVP of 3.   eICU Interventions  Plan: 500 cc bolus of NS for BP support Continue to monitor   Intervention Category Intermediate Interventions: Hypotension - evaluation and management  Guadelupe Sabin Hailie Searight 08/02/2013, 12:20 AM

## 2013-08-02 NOTE — Progress Notes (Signed)
Called by bedside RN.  Patient is decompensating with AMS.  ABG done revealed hypercarbia and acute metabolic acidosis.  The patient became more responsive with BiPAP.  I spoke with the daughter and husband.  Given cause of failure being her chronic lung disease and deconditioning and that family does not wish for trach/peg then intubating this patient would make very little sense.  After discussion, decision was made to make patient a full DNR, attempt BiPAP, if fails then transition to comfort care.  Additional CC time 45 min.  Rush Farmer, M.D. [facility] Pulmonary/Critical Care Medicine. Pager: [phone]. After hours pager: [phone].

## 2013-08-02 NOTE — Progress Notes (Signed)
RT Note- Placed on bipap per Dr. Tamala Julian, tolerating fairly well, cont to monitor.

## 2013-08-02 NOTE — Progress Notes (Signed)
PULMONARY / CRITICAL CARE MEDICINE   Name: Laura [name] MRN: [id number] DOB: 12/04/1929    ADMISSION DATE:  07/31/2013 CONSULTATION DATE:  08/02/13  REFERRING MD : Dr. Doyle Askew  PRIMARY SERVICE: PCCM   CHIEF COMPLAINT: AMS and SOB   BRIEF PATIENT DESCRIPTION:  The patient is a 80 year old female with history of CHF (EF 50 to 55% on 05/21/11),OSA,  CAD/MI, dyslipidemia, hypertension, asthma, kidney stone, who was admitted for SOB and syncope. Patient was found to be somnolent in ED. Chest x-ray and physical exam findings are consistent with CHF. ProBNP elevated 7176. Trop negative X 1. PCCM was asked to consult given worsening respiratory status even when patient is on BiPAP.   SIGNIFICANT EVENTS / STUDIES:  4/24 admitted for CHF and respiratory failure 4/25 ETT 4/25 Echo>>>EF 50% to 55%. grade 1 diastolic dysfunction. RV dilated. PA peak pressure: 61mm Hg (S). 4/26 Hypotensive due to diuresis and antihypertensive meds. CVP 3. Received NS bolus   LINES / TUBES:  4/25 ETT >>> 4/25 R IJ CVP>>> 4/25 OGT>>> 4/25 R radial A line>>>   CULTURES:  4/25 Blood >>> 4/25 respiratory culture>>>  4/26 Blood >>> 4/26 Urine>>> 4/26 Respiratory culture>>>  ANTIBIOTICS:  N/A   SUBJECTIVE:  Hypotensive overnight. Sedation meds off and received NS bolus. CVP 6 now Fever over last two days  VITAL SIGNS: Temp:  [97.8 F (36.6 C)-101.6 F (38.7 C)] 97.8 F (36.6 C) (04/26 0400) Pulse Rate:  [61-74] 64 (04/26 0422) Resp:  [10-29] 17 (04/26 0600) BP: (59-234)/(27-202) 119/43 mmHg (04/26 0422) SpO2:  [87 %-100 %] 99 % (04/26 0600) Arterial Line BP: (69-162)/(30-68) 109/48 mmHg (04/26 0600) FiO2 (%):  [50 %-100 %] 50 % (04/26 0600) Weight:  [198 lb 13.7 oz (90.2 kg)-203 lb 7.8 oz (92.3 kg)] 198 lb 13.7 oz (90.2 kg) (04/26 0500) HEMODYNAMICS: CVP:  [3 mmHg-8 mmHg] 8 mmHg VENTILATOR SETTINGS: Vent Mode:  [-] PCV FiO2 (%):  [50 %-100 %] 50 % Set Rate:  [10 bmp-20 bmp] 10 bmp Vt Set:  [400  mL] 400 mL PEEP:  [5 cmH20-10 cmH20] 5 cmH20 Plateau Pressure:  [13 cmH20-28 cmH20] 15 cmH20 INTAKE / OUTPUT: Intake/Output     04/25 0701 - 04/26 0700 04/26 0701 - 04/27 0700   I.V. (mL/kg) 705.2 (7.8)    NG/GT 760    IV Piggyback 50    Total Intake(mL/kg) 1515.2 (16.8)    Urine (mL/kg/hr) 5085 (2.3)    Total Output 5085     Net -3569.8            PHYSICAL EXAMINATION: General: Sedate but arousable and moving all ext to pain.  Neuro: Moves all extremities.  HEENT: PERRL, no JVD or carotid bruit  Cardiovascular: Regular rhythm, no Murmur  Lungs: Decrease BS and coarse diffusely  Abdomen: soft, distended, no guarding, Bowel sound present  Musculoskeletal: No leg edema  Skin: no rashes   LABS:  CBC  Recent Labs Lab 08/01/13 0952 08/01/13 1936 08/02/13 0356  WBC 6.6 7.9 9.1  HGB 8.9* 9.5* 8.7*  HCT 31.3* 31.9* 29.3*  PLT 111* 88* 98*   Coag's  Recent Labs Lab 08/01/13 1936  APTT 167*  INR 1.15   BMET  Recent Labs Lab 07/31/13 1731 08/01/13 0952 08/02/13 0356  NA 143 145 143  K 4.2 4.4 3.2*  CL 97 94* 94*  CO2 36* 35* 37*  BUN 15 18 32*  CREATININE 0.70 0.89 1.05  GLUCOSE 125* 93 130*   Electrolytes  Recent Labs  Lab 07/31/13 1731 08/01/13 0952 08/02/13 0356  CALCIUM 9.2 9.5 8.3*  MG  --  1.8 1.7  PHOS  --  2.9 4.0   Sepsis Markers No results found for this basename: LATICACIDVEN, PROCALCITON, O2SATVEN,  in the last 168 hours ABG  Recent Labs Lab 08/01/13 1835 08/01/13 2014 08/02/13 0430  PHART 7.617* 7.397 7.453*  PCO2ART 44.2 71.8* 57.8*  PO2ART 75.0* 111.0* 92.0   Liver Enzymes  Recent Labs Lab 07/31/13 1731  AST 13  ALT 9  ALKPHOS 65  BILITOT 0.6  ALBUMIN 3.4*   Cardiac Enzymes  Recent Labs Lab 07/31/13 1731 08/01/13 0850 08/01/13 0952  TROPONINI  --  <0.30  --   PROBNP 7176.0*  --  12114.0*   Glucose  Recent Labs Lab 08/01/13 0757 08/01/13 1152 08/01/13 1616 08/01/13 1950 08/02/13 0035 08/02/13 0356   GLUCAP 86 105* 118* 140* 136* 126*    Imaging Dg Chest Port 1 View  08/01/2013   CLINICAL DATA:  Central line placement.  EXAM: PORTABLE CHEST - 1 VIEW  COMPARISON:  08/01/2013 at 0644 hr  FINDINGS: Endotracheal tube remains in place with tip well above the carina. Right jugular central venous catheter has been placed, terminating over the mid SVC. Enteric tube courses towards the left upper abdomen off the inferior margin of the image. The cardiac silhouette remains enlarged. Thoracic aortic calcification is noted. Veiling opacity in the left lung base is unchanged and likely represents a pleural effusion. Right lateral costophrenic angle is excluded from the image, with previously described small right pleural effusion not well seen. Mild pulmonary vascular congestion and prominent interstitial markings have mildly improved from the prior study. No pneumothorax is identified.  IMPRESSION: 1. Interval right jugular CVC placement as above. 2. Cardiomegaly with mild interval improvement in interstitial edema. Small left pleural effusion.   Electronically Signed   By: Logan Bores   On: 08/01/2013 14:36   Dg Chest Port 1 View  08/01/2013   CLINICAL DATA:  Endotracheal tube  EXAM: PORTABLE CHEST - 1 VIEW  COMPARISON:  DG CHEST 1V PORT dated 07/31/2013  FINDINGS: An endotracheal tube has been placed. Tip is about 2.7 cm above the carina. Significant cardiac enlargement persists, with vascular congestion and hazy bibasilar densities suggesting pleural effusions. There remains moderate interstitial prominence suggesting pulmonary edema. Status post intubation, there is improved aeration at both lung bases.  IMPRESSION: Moderate interstitial pulmonary edema with small bilateral pleural effusions. Significantly improved aeration status post intubation.   Electronically Signed   By: Skipper Cliche M.D.   On: 08/01/2013 07:06   Dg Chest Port 1 View  07/31/2013   CLINICAL DATA:  Short of breath, near syncope   EXAM: PORTABLE CHEST - 1 VIEW  COMPARISON:  Prior chest x-ray 04/03/2013  FINDINGS: Stable marked cardiomegaly and widening of the mediastinum. Increased pulmonary vascular congestion bordering on edema. Probable bilateral layering pleural effusions and associated bibasilar atelectasis. No acute osseous abnormality.  IMPRESSION: Mild-moderate CHF.  Bilateral layering pleural effusions and associated bibasilar atelectasis versus infiltrate.  Similar appearance of massive cardiomegaly.   Electronically Signed   By: Jacqulynn Cadet M.D.   On: 07/31/2013 18:27   Dg Abd Portable 1v  08/01/2013   CLINICAL DATA:  OG placement  EXAM: PORTABLE ABDOMEN - 1 VIEW  COMPARISON:  Chest x-ray obtained earlier today  FINDINGS: The tip of the gastric tube projects over the gastric fundus. Marked enlargement of the cardiopericardial silhouette. The bowel gas pattern is not obstructed.  No free air. Degenerative changes throughout the thoracic spine. Normal bony mineralization.  IMPRESSION: The tip of the gastric tube projects over the gastric fundus.   Electronically Signed   By: Jacqulynn Cadet M.D.   On: 08/01/2013 10:01     ASSESSMENT / PLAN:  PULMONARY  A:  Acute hypercapnic respiratory failure, improving Pulmonary edema, improving Hx of asthma W/O exacerbation OSA ? OHS  P: Mechanical ventilation on Pressure support  Goal pH>7.30, SpO2>92 VAP bundle Daily SBT Follow ABG and CXR  Neb and home inhalers Diuresis per card, lasix 60 mg bid--> lasix 40 daily.  Consider PE work up if minimum oxygenation improvement after diuresis LE Doppler today>>>  CARDIOVASCULAR  A:  Hypotension, received NS bolus 500 ml  Acute on chronic HFpEF, improving, Neg 3.5 L since admission. Weight down 7 lbs HTN HLD Hx of CAD/MI Echo: EF 50% to 55%. grade 1 diastolic dysfunction. RV dilated. PA peak pressure: 21mm Hg (S).  P:  Trops neg x 2 CVP goal 6-8 Decrease Lasix to 40 mg Iv daily>>>titarting to BP and CVP.   Continue ASA, Lipitor,  Imdur, Metoprolol  Will order Lopressor 5 mg IV Q6 PRN ( unable to take po pending speech eval) Stop Zetia  Stop Amlodipine (not home med) Hold irbesartan given hypotension>>> resume once BP tolerates Consider PE workup if no sig improvement on oxygenation ( Resp. Failure and new RV dilated)  RENAL  A:  Hypokalemia Hypomagnesemia  Contraction metabolic alkalosis  At risk for prerenal vs ATN given CHF  Hx of ureteral stent in 2014 P:  F/U BMP/Mg/Phos.  K 40 po x 1 Mg 2 g IV x 1  GASTROINTESTINAL  A:  Stress ulcer Px P:  Pepcid TF   HEMATOLOGIC  A:  Normocytic Anemia Thrombocytopenia  Coagulopathy, elevated PTT at 167, unclear etiology P:  Follow CBC Transfuse for Hg <=7.0 Repeat coagulation panel Cnsider mix study if psistent elevated PTT>>>   INFECTIOUS  A:  Fever, unknown etiology. No leukocytosis P:  Pan culture Lactic acid>>> PCT>>> Assess infectious vs noninfectious etiology LE doppler r/o DVT>>> Consider PE work up if minimum improvement oxygenation after diuresis.  Hold Abx for now pending above lab>> less impressive from infectious etiology.   ENDOCRINE  A:  ? Hypothyroidism, elevated TSH 7.34 P:  Free T4, T3 pending  NEUROLOGIC  A:  Acute encephalopathy, improving Hx of Depression P:  Monitor off sedation Fentanyl gtt Off precedex  Charlann Lange, MD PGY-3 IMTS  TODAY'S SUMMARY: Patient is ready for extubation, spoke with family extensively, they definitely do not want trach/peg and are more concerned with quality rather than quantity of life.  After discussion, decided to extubate and if fails then depending on the cause of respiratory failure, if it is fluid related then will reintubate, if it is worsening of her asthma or muscle strength related then will not.  If a reversible cause then will reintubate.  In the meantime, no CPR and no cardioversion.  I have personally obtained a history, examined the patient, evaluated  laboratory and imaging results, formulated the assessment and plan and placed orders.  CRITICAL CARE: The patient is critically ill with multiple organ systems failure and requires high complexity decision making for assessment and support, frequent evaluation and titration of therapies, application of advanced monitoring technologies and extensive interpretation of multiple databases. Critical Care Time devoted to patient care services described in this note is 35 minutes.   Rush Farmer, M.D. [facility] Pulmonary/Critical Care Medicine.  Pager: [phone]. After hours pager: [phone].

## 2013-08-02 NOTE — Progress Notes (Signed)
[facility] Progress Note Patient Name: Laura [name] DOB: [birth year] MRN: [id number]  Date of Service  08/02/2013   HPI/Events of Note   Hypokalemia  eICU Interventions  Potassium replaced   Intervention Category Minor Interventions: Electrolytes abnormality - evaluation and management  Laura [name] 08/02/2013, 5:34 AM

## 2013-08-02 NOTE — Progress Notes (Signed)
Primary Cardiologist: Dr. Wynonia Lawman   Subjective:  Currently intubated. Mild coughing. Had hypotension overnight. Diuretic dose adjusted. Multiple antihypertensives have been held. Mild fluid bolus has been given. CVP 3-5. She diuresed approximately 5 L with IV Lasix in the past 2 days empiric diastolic heart failure.  Objective:  Vital Signs in the last 24 hours: Temp:  [97.8 F (36.6 C)-101.6 F (38.7 C)] 101.2 F (38.4 C) (04/26 0734) Pulse Rate:  [61-71] 71 (04/26 0805) Resp:  [10-29] 18 (04/26 0805) BP: (59-234)/(27-202) 102/45 mmHg (04/26 0805) SpO2:  [94 %-100 %] 99 % (04/26 0805) Arterial Line BP: (69-162)/(30-68) 105/49 mmHg (04/26 0800) FiO2 (%):  [50 %-100 %] 50 % (04/26 0805) Weight:  [198 lb 13.7 oz (90.2 kg)-203 lb 7.8 oz (92.3 kg)] 198 lb 13.7 oz (90.2 kg) (04/26 0500)  Intake/Output from previous day: 04/25 0701 - 04/26 0700 In: 1565.2 [I.V.:715.2; NG/GT:800; IV Piggyback:50] Out: 5085 [Urine:5085]   Physical Exam: General: Well developed, well nourished, ill-appearing, intubated  Head:  Normocephalic and atraumatic. ET tube in place. No significant JVD appreciated Lungs: Coarse breath sounds/vent noise bilaterally. Heart: Normal S1 and S2.  No murmur, rubs or gallops.  Abdomen: soft, non-tender, positive bowel sounds. Extremities: No clubbing or cyanosis. No edema. Neurologic: Alert on ventilator Skin: Warm and dry    Lab Results:  Recent Labs  08/01/13 1936 08/02/13 0356  WBC 7.9 9.1  HGB 9.5* 8.7*  PLT 88* 98*    Recent Labs  08/01/13 0952 08/02/13 0356  NA 145 143  K 4.4 3.2*  CL 94* 94*  CO2 35* 37*  GLUCOSE 93 130*  BUN 18 32*  CREATININE 0.89 1.05    Hepatic Function Panel  Recent Labs  07/31/13 1731  PROT 7.1  ALBUMIN 3.4*  AST 13  ALT 9  ALKPHOS 65  BILITOT 0.6   ProBNP 7176   Imaging: Dg Chest Port 1 View  08/01/2013   CLINICAL DATA:  Central line placement.  EXAM: PORTABLE CHEST - 1 VIEW  COMPARISON:  08/01/2013  at 0644 hr  FINDINGS: Endotracheal tube remains in place with tip well above the carina. Right jugular central venous catheter has been placed, terminating over the mid SVC. Enteric tube courses towards the left upper abdomen off the inferior margin of the image. The cardiac silhouette remains enlarged. Thoracic aortic calcification is noted. Veiling opacity in the left lung base is unchanged and likely represents a pleural effusion. Right lateral costophrenic angle is excluded from the image, with previously described small right pleural effusion not well seen. Mild pulmonary vascular congestion and prominent interstitial markings have mildly improved from the prior study. No pneumothorax is identified.  IMPRESSION: 1. Interval right jugular CVC placement as above. 2. Cardiomegaly with mild interval improvement in interstitial edema. Small left pleural effusion.   Electronically Signed   By: Logan Bores   On: 08/01/2013 14:36   Dg Chest Port 1 View  08/01/2013   CLINICAL DATA:  Endotracheal tube  EXAM: PORTABLE CHEST - 1 VIEW  COMPARISON:  DG CHEST 1V PORT dated 07/31/2013  FINDINGS: An endotracheal tube has been placed. Tip is about 2.7 cm above the carina. Significant cardiac enlargement persists, with vascular congestion and hazy bibasilar densities suggesting pleural effusions. There remains moderate interstitial prominence suggesting pulmonary edema. Status post intubation, there is improved aeration at both lung bases.  IMPRESSION: Moderate interstitial pulmonary edema with small bilateral pleural effusions. Significantly improved aeration status post intubation.   Electronically Signed   By:  Skipper Cliche M.D.   On: 08/01/2013 07:06   Dg Chest Port 1 View  07/31/2013   CLINICAL DATA:  Short of breath, near syncope  EXAM: PORTABLE CHEST - 1 VIEW  COMPARISON:  Prior chest x-ray 04/03/2013  FINDINGS: Stable marked cardiomegaly and widening of the mediastinum. Increased pulmonary vascular congestion  bordering on edema. Probable bilateral layering pleural effusions and associated bibasilar atelectasis. No acute osseous abnormality.  IMPRESSION: Mild-moderate CHF.  Bilateral layering pleural effusions and associated bibasilar atelectasis versus infiltrate.  Similar appearance of massive cardiomegaly.   Electronically Signed   By: Jacqulynn Cadet M.D.   On: 07/31/2013 18:27   Dg Abd Portable 1v  08/01/2013   CLINICAL DATA:  OG placement  EXAM: PORTABLE ABDOMEN - 1 VIEW  COMPARISON:  Chest x-ray obtained earlier today  FINDINGS: The tip of the gastric tube projects over the gastric fundus. Marked enlargement of the cardiopericardial silhouette. The bowel gas pattern is not obstructed. No free air. Degenerative changes throughout the thoracic spine. Normal bony mineralization.  IMPRESSION: The tip of the gastric tube projects over the gastric fundus.   Electronically Signed   By: Jacqulynn Cadet M.D.   On: 08/01/2013 10:01   Personally viewed Chest x-ray  Telemetry: No adverse arrhythmias Personally viewed.   EKG:  Normal sinus rhythm 71 with nonspecific ST-T wave changes  Cardiac Studies: 08/01/13 Echocardiogram EF 50-55%, mild right ventricular enlargement, estimated pulmonary pressures 45 mm mercury-likely secondary pulmonary hypertension from underlying lung disease  . antiseptic oral rinse  15 mL Mouth Rinse QID  . atorvastatin  20 mg Oral q1800  . chlorhexidine  15 mL Mouth/Throat BID  . famotidine (PEPCID) IV  20 mg Intravenous Q12H  . feeding supplement (PRO-STAT SUGAR FREE 64)  30 mL Per Tube BID  . feeding supplement (VITAL AF 1.2 CAL)  1,000 mL Per Tube Q24H  . fluticasone  2 puff Inhalation BID  . furosemide  40 mg Oral Daily  . isosorbide mononitrate  10 mg Oral BID  . magnesium sulfate 1 - 4 g bolus IVPB  2 g Intravenous Once  . metoprolol  50 mg Oral BID  . montelukast  10 mg Oral Daily  . potassium chloride  10 mEq Per Tube Daily  . sertraline  50 mg Oral Q breakfast  .  sodium chloride  3 mL Intravenous Q12H  . sodium chloride  3 mL Intravenous Q12H  . tamsulosin  0.4 mg Oral QHS   Assessment/Plan:   80 year old female with acute respiratory failure, coronary artery disease, diastolic heart failure acute with weight gain, pulmonary edema, COPD.  1. Acute respiratory failure-empirically gave IV Lasix 60 mg twice a day with good response. Because of hypotension, diuretic was adjusted to home dose and fluid bolus was administered.There may be a component of acute diastolic heart failure. BNP was in the 7000 range. PCO2 was 103, hypercarbic respiratory failure.  2. Acute diastolic heart failure-prior EF 50%. Holding angiotensin receptor blocker irbesartan 75 mg once a day, continuing isosorbide 30 mg once a day, metoprolol 50 mg twice a day. Echocardiogram still with 50%. Mild RV dilatation with secondary pulmonary hypertension mild 45 mm mercury systolic estimated. This could be from secondary lung disease. Lower extremity Dopplers have been ordered by primary team.  3. COPD-severe underlying lung disease. Discussed with Dr. Unknown Jim. She is on home oxygen 2-3 L.  4. CAD  5. Thoracic aortic aneurysm-4.3 cm-unchanged. Dr. Franne [facility] 08/02/2013, 8:36 AM

## 2013-08-02 NOTE — Progress Notes (Signed)
eLink Physician-Brief Progress Note Patient Name: Laura [name] DOB: 04-28-1929 MRN: [id number]  Date of Service  08/02/2013   HPI/Events of Note   Patient hypotensive.  Review of records reveals patient felt to be hypovolemic.     eICU Interventions  Bolus 1 liter NS      Melvia Heaps 08/02/2013, 4:24 PM

## 2013-08-03 ENCOUNTER — Inpatient Hospital Stay (HOSPITAL_COMMUNITY): Payer: Medicare Other

## 2013-08-03 DIAGNOSIS — R0602 Shortness of breath: Secondary | ICD-10-CM

## 2013-08-03 LAB — CBC
HCT: 26.6 % — ABNORMAL LOW (ref 36.0–46.0)
Hemoglobin: 7.9 g/dL — ABNORMAL LOW (ref 12.0–15.0)
MCH: 27.1 pg (ref 26.0–34.0)
MCHC: 29.7 g/dL — ABNORMAL LOW (ref 30.0–36.0)
MCV: 91.1 fL (ref 78.0–100.0)
Platelets: 92 10*3/uL — ABNORMAL LOW (ref 150–400)
RBC: 2.92 MIL/uL — AB (ref 3.87–5.11)
RDW: 15.3 % (ref 11.5–15.5)
WBC: 9.2 10*3/uL (ref 4.0–10.5)

## 2013-08-03 LAB — CULTURE, RESPIRATORY W GRAM STAIN

## 2013-08-03 LAB — BLOOD GAS, ARTERIAL
Acid-Base Excess: 10.5 mmol/L — ABNORMAL HIGH (ref 0.0–2.0)
Bicarbonate: 35.9 mEq/L — ABNORMAL HIGH (ref 20.0–24.0)
DRAWN BY: 13898
Delivery systems: POSITIVE
Expiratory PAP: 6
FIO2: 0.3 %
INSPIRATORY PAP: 14
O2 SAT: 96.4 %
Patient temperature: 98.1
TCO2: 37.8 mmol/L (ref 0–100)
pCO2 arterial: 60.4 mmHg (ref 35.0–45.0)
pH, Arterial: 7.39 (ref 7.350–7.450)
pO2, Arterial: 84.7 mmHg (ref 80.0–100.0)

## 2013-08-03 LAB — CULTURE, RESPIRATORY

## 2013-08-03 LAB — BASIC METABOLIC PANEL
BUN: 30 mg/dL — ABNORMAL HIGH (ref 6–23)
BUN: 27 mg/dL — ABNORMAL HIGH (ref 6–23)
CO2: 33 meq/L — AB (ref 19–32)
CO2: 37 meq/L — AB (ref 19–32)
CREATININE: 0.88 mg/dL (ref 0.50–1.10)
Calcium: 8 mg/dL — ABNORMAL LOW (ref 8.4–10.5)
Calcium: 8.6 mg/dL (ref 8.4–10.5)
Chloride: 99 mEq/L (ref 96–112)
Chloride: 99 mEq/L (ref 96–112)
Creatinine, Ser: 0.9 mg/dL (ref 0.50–1.10)
GFR calc Af Amer: 70 mL/min — ABNORMAL LOW (ref >90)
GFR calc non Af Amer: 58 mL/min — ABNORMAL LOW (ref >90)
GFR calc non Af Amer: 60 mL/min — ABNORMAL LOW (ref >90)
GFR, EST AFRICAN AMERICAN: 68 mL/min — AB (ref >90)
Glucose, Bld: 80 mg/dL (ref 70–99)
Glucose, Bld: 96 mg/dL (ref 70–99)
POTASSIUM: 2.8 meq/L — AB (ref 3.7–5.3)
Potassium: 4 mEq/L (ref 3.7–5.3)
SODIUM: 146 meq/L (ref 137–147)
SODIUM: 146 meq/L (ref 137–147)

## 2013-08-03 LAB — PROCALCITONIN: Procalcitonin: 0.4 ng/mL

## 2013-08-03 LAB — PHOSPHORUS
PHOSPHORUS: 3.1 mg/dL (ref 2.3–4.6)
PHOSPHORUS: 2.6 mg/dL (ref 2.3–4.6)

## 2013-08-03 LAB — MAGNESIUM
MAGNESIUM: 2.2 mg/dL (ref 1.5–2.5)
MAGNESIUM: 2 mg/dL (ref 1.5–2.5)

## 2013-08-03 MED ORDER — CIPROFLOXACIN HCL 500 MG PO TABS
500.0000 mg | ORAL_TABLET | Freq: Two times a day (BID) | ORAL | Status: DC
  Administered 2013-08-03 – 2013-08-05 (×4): 500 mg via ORAL
  Filled 2013-08-03 (×6): qty 1

## 2013-08-03 MED ORDER — POTASSIUM CHLORIDE 10 MEQ/50ML IV SOLN
10.0000 meq | INTRAVENOUS | Status: AC
  Administered 2013-08-03 (×4): 10 meq via INTRAVENOUS
  Filled 2013-08-03: qty 50

## 2013-08-03 MED ORDER — HEPARIN SODIUM (PORCINE) 5000 UNIT/ML IJ SOLN
5000.0000 [IU] | Freq: Three times a day (TID) | INTRAMUSCULAR | Status: DC
  Administered 2013-08-03 – 2013-08-11 (×25): 5000 [IU] via SUBCUTANEOUS
  Filled 2013-08-03 (×28): qty 1

## 2013-08-03 MED ORDER — FUROSEMIDE 10 MG/ML IJ SOLN
40.0000 mg | Freq: Every day | INTRAMUSCULAR | Status: DC
  Administered 2013-08-03: 40 mg via INTRAVENOUS
  Filled 2013-08-03: qty 4

## 2013-08-03 MED ORDER — POTASSIUM CHLORIDE CRYS ER 20 MEQ PO TBCR
40.0000 meq | EXTENDED_RELEASE_TABLET | Freq: Once | ORAL | Status: AC
  Administered 2013-08-03: 40 meq via ORAL
  Filled 2013-08-03: qty 2

## 2013-08-03 MED ORDER — POTASSIUM CHLORIDE CRYS ER 10 MEQ PO TBCR
10.0000 meq | EXTENDED_RELEASE_TABLET | Freq: Every day | ORAL | Status: DC
  Administered 2013-08-03 – 2013-08-11 (×9): 10 meq via ORAL
  Filled 2013-08-03 (×9): qty 1

## 2013-08-03 NOTE — Progress Notes (Signed)
PULMONARY / CRITICAL CARE MEDICINE   Name: Laura [name] MRN: [id number] DOB: 07-14-1929    ADMISSION DATE:  07/31/2013 CONSULTATION DATE:  08/03/2013  REFERRING MD : [facility] PRIMARY SERVICE: PCCM   CHIEF COMPLAINT: Acute respiratory failure  BRIEF PATIENT DESCRIPTION: 80 yo with diastolic CHF, OSA, asthma (?) admitted 4/24 with dyspnea and syncope. Patient was somnolent, chest x-ray and physical exam findings were consistent with CHF. ProBNP elevated 7176. Trop negative X 1. PCCM was asked to consult given worsening respiratory status.  SIGNIFICANT EVENTS / STUDIES:  4/24 Admitted for CHF and respiratory failure  4/25 Intubated 4/25 Echo >>> EF 50% to 55%. grade 1 diastolic dysfunction. RV dilated. PA peak pressure: 24mm Hg (S).  4/26 Hypotensive due to diuresis and antihypertensive meds. CVP 3. Received NS bolus  4/26 Extubation  LINES / TUBES:  ETT 4/25 >>> 4/26 R IJ CVP 4/25 >>>  OGT 4/25 >>> 4/26 R radial A line 4/25 >>> 4/26  CULTURES:  4/25 Blood >>>  4/25 Respiratory >>>  4/26 Blood >>>  4/26 Urine >>>  4/26 Respiratory >>>   ANTIBIOTICS:   SUBJECTIVE Extubated yesterday. Recurrent hypercapnic respiratory failure responding to BiPAP. FULL DNR now. Received one dose of Lasix 40 IV x 1 and BP dropped. NS 1L x 1 given.   VITAL SIGNS: Temp:  [98.1 F (36.7 C)-101.2 F (38.4 C)] 98.1 F (36.7 C) (04/26 1930) Pulse Rate:  [71-105] 105 (04/26 1219) Resp:  [14-30] 20 (04/27 0600) BP: (102)/(45) 102/45 mmHg (04/26 0805) SpO2:  [94 %-100 %] 100 % (04/27 0600) Arterial Line BP: (74-176)/(39-61) 176/61 mmHg (04/27 0600) FiO2 (%):  [30 %-50 %] 30 % (04/27 0400) Weight:  [197 lb 8.5 oz (89.6 kg)] 197 lb 8.5 oz (89.6 kg) (04/27 0400)  HEMODYNAMICS: CVP:  [0 mmHg-13 mmHg] 7 mmHg  VENTILATOR SETTINGS: Vent Mode:  [-] BIPAP FiO2 (%):  [30 %-50 %] 30 % Set Rate:  [10 bmp] 10 bmp PEEP:  [5 cmH20-6 cmH20] 6 cmH20 Pressure Support:  [5 cmH20] 5 cmH20 Plateau Pressure:   [14 cmH20-15 cmH20] 14 cmH20  INTAKE / OUTPUT: Intake/Output     04/26 0701 - 04/27 0700 04/27 0701 - 04/28 0700   P.O. 160    I.V. (mL/kg) 1263.5 (14.1)    NG/GT 80    IV Piggyback 250    Total Intake(mL/kg) 1753.5 (19.6)    Urine (mL/kg/hr) 1375 (0.6)    Total Output 1375     Net +378.5            PHYSICAL EXAMINATION: General: chronic ill appearing Neuro: alert and follow commands HEENT: PERRL, no JVD or carotid bruit  Cardiovascular: Regular rhythm, no Murmur  Lungs: diminished. Bibasilar rales noted.  Abdomen: soft, distended, no guarding, Bowel sound present  Musculoskeletal: No leg edema  Skin: no rashes  LABS:  CBC  Recent Labs Lab 08/01/13 1936 08/02/13 0356 08/03/13 0420  WBC 7.9 9.1 9.2  HGB 9.5* 8.7* 7.9*  HCT 31.9* 29.3* 26.6*  PLT 88* 98* 92*   Coag's  Recent Labs Lab 08/01/13 1936 08/02/13 0835  APTT 167* 39*  INR 1.15 1.09   BMET  Recent Labs Lab 08/01/13 0952 08/02/13 0356 08/03/13 0420  NA 145 143 146  K 4.4 3.2* 2.8*  CL 94* 94* 99  CO2 35* 37* 33*  BUN 18 32* 30*  CREATININE 0.89 1.05 0.90  GLUCOSE 93 130* 80   Electrolytes  Recent Labs Lab 08/01/13 0952 08/02/13 0356 08/03/13 0420  CALCIUM 9.5 8.3* 8.0*  MG 1.8 1.7 2.2  PHOS 2.9 4.0 3.1   Sepsis Markers  Recent Labs Lab 08/02/13 0830 08/02/13 0835 08/03/13 0420  LATICACIDVEN 0.9  --   --   PROCALCITON  --  0.63 0.40   ABG  Recent Labs Lab 08/02/13 0430 08/02/13 1548 08/03/13 0420  PHART 7.453* 7.284* 7.390  PCO2ART 57.8* 84.8* 60.4*  PO2ART 92.0 86.0 84.7   Liver Enzymes  Recent Labs Lab 07/31/13 1731  AST 13  ALT 9  ALKPHOS 65  BILITOT 0.6  ALBUMIN 3.4*   Cardiac Enzymes  Recent Labs Lab 07/31/13 1731 08/01/13 0850 08/01/13 0952  TROPONINI  --  <0.30  --   PROBNP 7176.0*  --  12114.0*   Glucose  Recent Labs Lab 08/02/13 0035 08/02/13 0356 08/02/13 0737 08/02/13 1129 08/02/13 1601 08/02/13 1933  GLUCAP 136* 126* 122* 97  119* 106*   IMAGING:  Dg Chest Port 1 View  08/03/2013   CLINICAL DATA:  Respiratory failure, intubated  EXAM: PORTABLE CHEST - 1 VIEW  COMPARISON:  DG CHEST 1V PORT dated 08/02/2013  FINDINGS: The cardiac silhouette remains moderate to severely enlarged. Mediastinal silhouette is nonsuspicious, mildly calcified aortic knob. Similar central pulmonary vasculature congestion, retrocardiac consolidation with small pleural effusions.  Interval extubation. Interval removal of nasogastric tube. No apparent change in position of right internal jugular central venous catheter with distal tip projecting in mid superior vena cava. No pneumothorax. Multiple EKG lines overlie the patient and may obscure subtle underlying pathology.  IMPRESSION: Interval extubation and removal of nasogastric tube, without apparent change remaining life support lines.  Stable CHF, with retrocardiac consolidation and small pleural effusions.   Electronically Signed   By: Elon Alas   On: 08/03/2013 06:49   Dg Chest Port 1 View  08/02/2013   CLINICAL DATA:  Evaluate endotracheal tube positioning  EXAM: PORTABLE CHEST - 1 VIEW  COMPARISON:  DG CHEST 1V PORT dated 08/01/2013; DG CHEST 1V PORT dated 07/31/2013; DG CHEST 2 VIEW dated 04/03/2013; DG CHEST 2 VIEW dated 05/22/2011; CT CHEST W/CM dated 09/09/2012  FINDINGS: The examination is degraded due to patient body habitus and portable technique.  Grossly unchanged cardiac silhouette and mediastinal contours. Stable positioning of support apparatus. The pulmonary vasculature remains indistinct with cephalization of flow. Grossly unchanged bilateral mid and lower lung heterogeneous/consolidative opacities, left greater than right. Trace bilateral effusions are not excluded. Unchanged bones.  IMPRESSION: 1.  Stable positioning of support apparatus.  No pneumothorax. 2. Similar findings of mild pulmonary edema and bilateral mid and lower lung opacities, atelectasis versus infiltrate.    Electronically Signed   By: Sandi Mariscal M.D.   On: 08/02/2013 08:37   Dg Chest Port 1 View  08/01/2013   CLINICAL DATA:  Central line placement.  EXAM: PORTABLE CHEST - 1 VIEW  COMPARISON:  08/01/2013 at 0644 hr  FINDINGS: Endotracheal tube remains in place with tip well above the carina. Right jugular central venous catheter has been placed, terminating over the mid SVC. Enteric tube courses towards the left upper abdomen off the inferior margin of the image. The cardiac silhouette remains enlarged. Thoracic aortic calcification is noted. Veiling opacity in the left lung base is unchanged and likely represents a pleural effusion. Right lateral costophrenic angle is excluded from the image, with previously described small right pleural effusion not well seen. Mild pulmonary vascular congestion and prominent interstitial markings have mildly improved from the prior study. No pneumothorax is identified.  IMPRESSION:  1. Interval right jugular CVC placement as above. 2. Cardiomegaly with mild interval improvement in interstitial edema. Small left pleural effusion.   Electronically Signed   By: Logan Bores   On: 08/01/2013 14:36   Dg Abd Portable 1v  08/01/2013   CLINICAL DATA:  OG placement  EXAM: PORTABLE ABDOMEN - 1 VIEW  COMPARISON:  Chest x-ray obtained earlier today  FINDINGS: The tip of the gastric tube projects over the gastric fundus. Marked enlargement of the cardiopericardial silhouette. The bowel gas pattern is not obstructed. No free air. Degenerative changes throughout the thoracic spine. Normal bony mineralization.  IMPRESSION: The tip of the gastric tube projects over the gastric fundus.   Electronically Signed   By: Jacqulynn Cadet M.D.   On: 08/01/2013 10:01    ASSESSMENT / PLAN:  PULMONARY  A:  Acute on chronic respiratory failure Pulmonary edema Pneumonia unlikely OSA / OHS  Suspect undiagnosed COPD without exacerbation DNI status P:  Goal SpO2>92 Supplemental oxygen BiPAP PRN  ( distress, hypercarbia ) Flovent Albuterol PRN  CARDIOVASCULAR  A:  Transient hypotension, resolved with fluids Acute on chronic diastolic heart failuire HTN  CAD AF Thoracic aortic aneurysm ( 4.3 cm - unchanged, Dr. Sherren Mocha Early ) Possible pulmonary hypertension secondary to chronic heart / lung disease DNR status P:  ASA, Lipitor Hold Imdur, Irbesartan, Metoprolol given hypotensive after Lasix Lopressor 5 mg IV Q6 PRN for HR > 110 Stop Zetia  Stop Amlodipine (not home med)  Singulair  RENAL  A:  Hypokalemia  Hx of ureteral stent in 2014  P:  Trend BMP, Mg, Phos K 40 x 1 + 10 x 4 Lasix 40 daily  GASTROINTESTINAL  A:  GI Px is not indicated Nutrition P:  D/c Pepcid  Pending speech eval   HEMATOLOGIC  A:  Anemia  Thrombocytopenia  VTE Px P:  Follow CBC  SCD Add Heparin  INFECTIOUS  A:  No overt infection P:  Monitor  ENDOCRINE  A:  Subclinical hypothyroidism P:  Defer Levothyroxine Outpatient follow up.   NEUROLOGIC  A:  Acute encephalopathy, improving  Hx of depression  P:  Zoloft  Charlann Lange, MD PGY-3  [facility]  08/03/2013, 7:17 AM  I have personally obtained history, examined patient, evaluated and interpreted laboratory and imaging results, reviewed medical records, formulated assessment / plan and placed orders.  Doree Fudge, MD [facility] [place] Pager: [phone]  08/03/2013, 9:10 AM

## 2013-08-03 NOTE — Plan of Care (Signed)
Problem: Phase I Progression Outcomes Goal: Up in chair, BRP Outcome: Progressing Pt evaluation ordered. Awaiting recommendation  Goal: Initial discharge plan identified Outcome: Progressing Palliative care consult pending Goal: Voiding-avoid urinary catheter unless indicated Outcome: Progressing Catheter care administered. Excessive diuresis.

## 2013-08-03 NOTE — Progress Notes (Signed)
VASCULAR LAB PRELIMINARY  PRELIMINARY  PRELIMINARY  PRELIMINARY  Bilateral lower extremity venous Dopplers completed.    Preliminary report:  There is no DVT or SVT noted in the bilateral lower extremities.  Iantha Fallen, RVT 08/03/2013, 9:59 AM

## 2013-08-03 NOTE — Progress Notes (Signed)
NUTRITION FOLLOW UP  Intervention:   1.  Modify diet; resume PO diet once medically appropriate per MD discretion.  Nutrition Dx:   Inadequate oral intake, ongoing  Monitor:   1.  Food/Beverage; pt meeting >/=90% estimated needs with tolerance. 2.  Wt/wt change; monitor trends  Assessment:   PMHx significant for CHF, HTN, dyslipidemia. Admitted with worsening SOB x several days. Work-up reveals acute hypoxic respiratory failure and CHF exacerbation.  Pt initially requiring intubation, however, has since been extubated.  Pt's diet has been advanced to clear liquids. Needs reassessed.  Will continue to follow for ongoing diet advancement and tolerance, as well as pt's ability to meet needs.   Height: Ht Readings from Last 1 Encounters:  08/01/13 5\' 4"  (1.626 m)    Weight Status:   Wt Readings from Last 1 Encounters:  08/03/13 197 lb 8.5 oz (89.6 kg)    Re-estimated needs:  Kcal: 1790-1920 Protein: 80-95g Fluid: ~1.8 L/day  Skin: non-pitting edema  Diet Order: Cardiac   Intake/Output Summary (Last 24 hours) at 08/03/13 1236 Last data filed at 08/03/13 1017  Gross per 24 hour  Intake   1640 ml  Output   1300 ml  Net    340 ml    Last BM: 4/26   Labs:   Recent Labs Lab 08/01/13 0952 08/02/13 0356 08/03/13 0420  NA 145 143 146  K 4.4 3.2* 2.8*  CL 94* 94* 99  CO2 35* 37* 33*  BUN 18 32* 30*  CREATININE 0.89 1.05 0.90  CALCIUM 9.5 8.3* 8.0*  MG 1.8 1.7 2.2  PHOS 2.9 4.0 3.1  GLUCOSE 93 130* 80    CBG (last 3)   Recent Labs  08/02/13 1129 08/02/13 1601 08/02/13 1933  GLUCAP 97 119* 106*    Scheduled Meds: . antiseptic oral rinse  15 mL Mouth Rinse QID  . atorvastatin  20 mg Oral q1800  . chlorhexidine  15 mL Mouth/Throat BID  . fluticasone  2 puff Inhalation BID  . furosemide  40 mg Intravenous Daily  . heparin subcutaneous  5,000 Units Subcutaneous 3 times per day  . montelukast  10 mg Oral Daily  . potassium chloride  10 mEq Oral Daily   . sertraline  50 mg Oral Q breakfast  . sodium chloride  3 mL Intravenous Q12H  . sodium chloride  3 mL Intravenous Q12H    Continuous Infusions: . sodium chloride 250 mL (08/02/13 0028)    Brynda Greathouse, MS RD LDN Clinical Inpatient Dietitian Pager: [phone] Weekend/After hours pager: [phone]

## 2013-08-03 NOTE — Progress Notes (Signed)
Name: NIKKIE LIMING MRN: [id number] DOB: 05-15-1929  ELECTRONIC ICU PHYSICIAN NOTE  Problem:  UTI/ 100 k on culture e coli   Intervention:  cipro 500 mg po  bid and will need sensitivities checked  Tanda Rockers 08/03/2013, 3:13 PM

## 2013-08-03 NOTE — Progress Notes (Signed)
Subjective:  Events of weekend reviewed.  Intubated with resp failure and CO2 retention.  ALso has element of diastolic dysfunction.  Extubated yesterday.  Not currently SOB.  Objective:  Vital Signs in the last 24 hours: BP 132/44  Pulse 105  Temp(Src) 97.7 F (36.5 C) (Oral)  Resp 23  Ht 5\' 4"  (1.626 m)  Wt 89.6 kg (197 lb 8.5 oz)  BMI 33.89 kg/m2  SpO2 100%  Physical Exam: Pleasant WF in NAD Lungs:  Decreased BSCardiac:  Regular rhythm, normal S1 and S2, no S3 Abdomen:  Soft, nontender, no masses Extremities:  No edema present  Intake/Output from previous day: 04/26 0701 - 04/27 0700 In: 1753.5 [P.O.:160; I.V.:1263.5; NG/GT:80; IV Piggyback:250] Out: 1375 [Urine:1375] Weight Filed Weights   08/01/13 0923 08/02/13 0500 08/03/13 0400  Weight: 92.3 kg (203 lb 7.8 oz) 90.2 kg (198 lb 13.7 oz) 89.6 kg (197 lb 8.5 oz)    Lab Results: Basic Metabolic Panel:  Recent Labs  08/02/13 0356 08/03/13 0420  NA 143 146  K 3.2* 2.8*  CL 94* 99  CO2 37* 33*  GLUCOSE 130* 80  BUN 32* 30*  CREATININE 1.05 0.90    CBC:  Recent Labs  08/02/13 0300 08/02/13 0356 08/03/13 0420  WBC  --  9.1 9.2  NEUTROABS 7.7  --   --   HGB  --  8.7* 7.9*  HCT  --  29.3* 26.6*  MCV  --  90.7 91.1  PLT  --  98* 92*    BNP    Component Value Date/Time   PROBNP 12114.0* 08/01/2013 0952    PROTIME: Lab Results  Component Value Date   INR 1.09 08/02/2013   INR 1.15 08/01/2013    Telemetry: sinus  Assessment/Plan:  1. Acute on chronic resp failure 2. Acute on chronic diastolic dysfunction 3. Hypokalemia 4. Anemia  Rec:  Discussed with patient and family.  To observe in unit today. Likely will need CPAP  on discharge.       Kerry Hough  MD [facility]  08/03/2013, 9:31 AM

## 2013-08-03 NOTE — Progress Notes (Signed)
SLP Cancellation Note  Patient Details Name: MIMI DEBELLIS MRN: [id number] DOB: January 23, 1930   Cancelled treatment:        Order received for Bedside Swallow evaluation (attempted 4/25 but pt. Was still intubated). SLP returned today, reviewed chart and initiated eval, then was informed by RN that MD had cancelled the eval and wrote for Clears/advance as tolerated.  No cancel order in chart at this time, however, will defer via RN's verbal order to cancel Swallow Evaluation.   Joaquim Nam 08/03/2013, 10:08 AM

## 2013-08-03 NOTE — Progress Notes (Signed)
[place] Progress Note Patient Name: Laura [name] DOB: 1929-12-10 MRN: [id number]  Date of Service  08/03/2013   HPI/Events of Note     eICU Interventions  Hypokalemia, repleted    Intervention Category Minor Interventions: Electrolytes abnormality - evaluation and management  Juanito Doom 08/03/2013, 5:34 AM

## 2013-08-04 ENCOUNTER — Encounter (HOSPITAL_COMMUNITY): Payer: Self-pay

## 2013-08-04 ENCOUNTER — Inpatient Hospital Stay (HOSPITAL_COMMUNITY): Payer: Medicare Other

## 2013-08-04 LAB — CBC
HEMATOCRIT: 29.9 % — AB (ref 36.0–46.0)
HEMOGLOBIN: 8.8 g/dL — AB (ref 12.0–15.0)
MCH: 27.7 pg (ref 26.0–34.0)
MCHC: 29.4 g/dL — AB (ref 30.0–36.0)
MCV: 94 fL (ref 78.0–100.0)
Platelets: 99 10*3/uL — ABNORMAL LOW (ref 150–400)
RBC: 3.18 MIL/uL — ABNORMAL LOW (ref 3.87–5.11)
RDW: 15 % (ref 11.5–15.5)
WBC: 7 10*3/uL (ref 4.0–10.5)

## 2013-08-04 LAB — BLOOD GAS, ARTERIAL
Acid-Base Excess: 14.2 mmol/L — ABNORMAL HIGH (ref 0.0–2.0)
BICARBONATE: 40.4 meq/L — AB (ref 20.0–24.0)
Drawn by: 41308
O2 Content: 4 L/min
O2 Saturation: 97.2 %
PATIENT TEMPERATURE: 98.6
PO2 ART: 90.1 mmHg (ref 80.0–100.0)
TCO2: 42.7 mmol/L (ref 0–100)
pCO2 arterial: 76.3 mmHg (ref 35.0–45.0)
pH, Arterial: 7.344 — ABNORMAL LOW (ref 7.350–7.450)

## 2013-08-04 LAB — BASIC METABOLIC PANEL
BUN: 21 mg/dL (ref 6–23)
BUN: 21 mg/dL (ref 6–23)
CHLORIDE: 101 meq/L (ref 96–112)
CO2: 38 meq/L — AB (ref 19–32)
CO2: 37 mEq/L — ABNORMAL HIGH (ref 19–32)
CREATININE: 0.8 mg/dL (ref 0.50–1.10)
Calcium: 9 mg/dL (ref 8.4–10.5)
Calcium: 9.3 mg/dL (ref 8.4–10.5)
Chloride: 102 mEq/L (ref 96–112)
Creatinine, Ser: 0.79 mg/dL (ref 0.50–1.10)
GFR calc Af Amer: 78 mL/min — ABNORMAL LOW (ref >90)
GFR calc non Af Amer: 67 mL/min — ABNORMAL LOW (ref >90)
GFR calc non Af Amer: 76 mL/min — ABNORMAL LOW (ref >90)
GFR, EST AFRICAN AMERICAN: 88 mL/min — AB (ref >90)
Glucose, Bld: 92 mg/dL (ref 70–99)
Glucose, Bld: 179 mg/dL — ABNORMAL HIGH (ref 70–99)
POTASSIUM: 3.8 meq/L (ref 3.7–5.3)
POTASSIUM: 4.1 meq/L (ref 3.7–5.3)
SODIUM: 148 meq/L — AB (ref 137–147)
Sodium: 148 mEq/L — ABNORMAL HIGH (ref 137–147)

## 2013-08-04 LAB — CULTURE, RESPIRATORY: GRAM STAIN: NONE SEEN

## 2013-08-04 LAB — GLUCOSE, CAPILLARY: GLUCOSE-CAPILLARY: 96 mg/dL (ref 70–99)

## 2013-08-04 LAB — PROCALCITONIN: Procalcitonin: 0.27 ng/mL

## 2013-08-04 LAB — CULTURE, RESPIRATORY W GRAM STAIN

## 2013-08-04 LAB — MAGNESIUM
Magnesium: 2.2 mg/dL (ref 1.5–2.5)
Magnesium: 2.2 mg/dL (ref 1.5–2.5)

## 2013-08-04 LAB — PHOSPHORUS
PHOSPHORUS: 2.7 mg/dL (ref 2.3–4.6)
Phosphorus: 3.4 mg/dL (ref 2.3–4.6)

## 2013-08-04 LAB — URINE CULTURE: Colony Count: >=100000

## 2013-08-04 MED ORDER — BIOTENE DRY MOUTH MT LIQD
15.0000 mL | Freq: Two times a day (BID) | OROMUCOSAL | Status: DC
  Administered 2013-08-05 – 2013-08-10 (×10): 15 mL via OROMUCOSAL

## 2013-08-04 MED ORDER — ACETAZOLAMIDE 250 MG PO TABS
250.0000 mg | ORAL_TABLET | Freq: Every day | ORAL | Status: DC
  Administered 2013-08-04 – 2013-08-05 (×2): 250 mg via ORAL
  Filled 2013-08-04 (×3): qty 1

## 2013-08-04 MED ORDER — METOPROLOL TARTRATE 50 MG PO TABS
50.0000 mg | ORAL_TABLET | Freq: Two times a day (BID) | ORAL | Status: DC
  Administered 2013-08-04 – 2013-08-06 (×5): 50 mg via ORAL
  Filled 2013-08-04 (×10): qty 1

## 2013-08-04 NOTE — Progress Notes (Signed)
PULMONARY / CRITICAL CARE MEDICINE   Name: Laura [name] MRN: [id number] DOB: 10/27/1929    ADMISSION DATE:  07/31/2013 CONSULTATION DATE:  08/04/13  REFERRING MD : [facility]  PRIMARY SERVICE: PCCM   CHIEF COMPLAINT: Acute respiratory failure   BRIEF PATIENT DESCRIPTION: 80 yo with diastolic CHF, OSA, asthma (?) admitted 4/24 with dyspnea and syncope. Patient was somnolent, chest x-ray and physical exam findings were consistent with CHF. ProBNP elevated 7176. Trop negative X 1. PCCM was asked to consult given worsening respiratory status.   SIGNIFICANT EVENTS / STUDIES:  4/24 Admitted for CHF and respiratory failure  4/25 Intubated  4/25 Echo >>> EF 50% to 55%. grade 1 diastolic dysfunction. RV dilated. PA peak pressure: 20mm Hg (S).  4/26 Hypotensive due to diuresis and antihypertensive meds. CVP 3. Received NS bolus  4/26 Extubation   LINES / TUBES:  ETT 4/25 >>> 4/26  R IJ CVP 4/25 >>>  OGT 4/25 >>> 4/26  R radial A line 4/25 >>> 4/27   CULTURES:  4/25 Blood >>>  4/25 Respiratory >>> neg 4/26 Blood >>>  4/26 Urine >>> ESCHERICHIA COLI 4/26 Respiratory >>>   ANTIBIOTICS:  Ciprofloxacin 4/27 >>>  INTERVAL HISTORY:  CO2 retention >>> BiPAP. Cipro was added for UTI.   VITAL SIGNS: Temp:  [97.7 F (36.5 C)-98.7 F (37.1 C)] 98.7 F (37.1 C) (04/28 0400) Pulse Rate:  [72-105] 77 (04/27 1600) Resp:  [14-29] 19 (04/28 0600) BP: (112-158)/(36-89) 126/50 mmHg (04/28 0600) SpO2:  [92 %-100 %] 99 % (04/28 0600) Arterial Line BP: (135-156)/(33-59) 138/48 mmHg (04/27 1100)  HEMODYNAMICS: CVP:  [2 mmHg-3 mmHg] 2 mmHg  VENTILATOR SETTINGS:   INTAKE / OUTPUT: Intake/Output     04/27 0701 - 04/28 0700   P.O. 360   I.V. (mL/kg) 35.3 (0.4)   IV Piggyback 100   Total Intake(mL/kg) 495.3 (5.5)   Urine (mL/kg/hr) 2400 (1.1)   Total Output 2400   Net -1904.7        PHYSICAL EXAMINATION: General: chronic ill appearing. Lethargy but easily arousable   Neuro: alert and  follow commands  HEENT: PERRL, no JVD or carotid bruit  Cardiovascular: Regular rhythm, no Murmur  Lungs: diminished. Bibasilar rales noted.  Abdomen: soft, distended, no guarding, Bowel sound present  Musculoskeletal: No leg edema  Skin: no rashes  LABS:  CBC  Recent Labs Lab 08/02/13 0356 08/03/13 0420 08/04/13 0400  WBC 9.1 9.2 7.0  HGB 8.7* 7.9* 8.8*  HCT 29.3* 26.6* 29.9*  PLT 98* 92* 99*   Coag's  Recent Labs Lab 08/01/13 1936 08/02/13 0835  APTT 167* 39*  INR 1.15 1.09   BMET  Recent Labs Lab 08/03/13 0420 08/03/13 1600 08/04/13 0400  NA 146 146 148*  K 2.8* 4.0 3.8  CL 99 99 101  CO2 33* 37* 38*  BUN 30* 27* 21  CREATININE 0.90 0.88 0.80  GLUCOSE 80 96 92   Electrolytes  Recent Labs Lab 08/03/13 0420 08/03/13 1600 08/04/13 0400  CALCIUM 8.0* 8.6 9.0  MG 2.2 2.0 2.2  PHOS 3.1 2.6 3.4   Sepsis Markers  Recent Labs Lab 08/02/13 0830 08/02/13 0835 08/03/13 0420 08/04/13 0400  LATICACIDVEN 0.9  --   --   --   PROCALCITON  --  0.63 0.40 0.27   ABG  Recent Labs Lab 08/02/13 1548 08/03/13 0420 08/04/13 0425  PHART 7.284* 7.390 7.344*  PCO2ART 84.8* 60.4* 76.3*  PO2ART 86.0 84.7 90.1   Liver Enzymes  Recent Labs Lab 07/31/13  1731  AST 13  ALT 9  ALKPHOS 65  BILITOT 0.6  ALBUMIN 3.4*   Cardiac Enzymes  Recent Labs Lab 07/31/13 1731 08/01/13 0850 08/01/13 0952  TROPONINI  --  <0.30  --   PROBNP 7176.0*  --  12114.0*   Glucose  Recent Labs Lab 08/02/13 0035 08/02/13 0356 08/02/13 0737 08/02/13 1129 08/02/13 1601 08/02/13 1933  GLUCAP 136* 126* 122* 97 119* 106*   IMAGING:  Dg Chest Port 1 View  08/03/2013   CLINICAL DATA:  Respiratory failure, intubated  EXAM: PORTABLE CHEST - 1 VIEW  COMPARISON:  DG CHEST 1V PORT dated 08/02/2013  FINDINGS: The cardiac silhouette remains moderate to severely enlarged. Mediastinal silhouette is nonsuspicious, mildly calcified aortic knob. Similar central pulmonary  vasculature congestion, retrocardiac consolidation with small pleural effusions.  Interval extubation. Interval removal of nasogastric tube. No apparent change in position of right internal jugular central venous catheter with distal tip projecting in mid superior vena cava. No pneumothorax. Multiple EKG lines overlie the patient and may obscure subtle underlying pathology.  IMPRESSION: Interval extubation and removal of nasogastric tube, without apparent change remaining life support lines.  Stable CHF, with retrocardiac consolidation and small pleural effusions.   Electronically Signed   By: Elon Alas   On: 08/03/2013 06:49    ASSESSMENT / PLAN:  PULMONARY  A:  Acute on chronic respiratory failure  Pulmonary edema Pneumonia unlikely  OSA / OHS  Suspect undiagnosed COPD without exacerbation  DNI status  P:  Goal SpO2>92  Supplemental oxygen  Nasal auto PAP and ABG in AM Flovent  Albuterol PRN  Singulair  May need polysomnography as outpatient  CARDIOVASCULAR  A:  Acute on chronic diastolic heart failure Transient hypotension, resolved with fluids   HTN  CAD  AF  Thoracic aortic aneurysm ( 4.3 cm - unchanged, Dr. Sherren Mocha Early )  Possible pulmonary hypertension secondary to chronic heart / lung disease  DNR status  P:  ASA, Lipitor  Hold Imdur, Irbesartan,  Add home dose Metoprolol 50 mg BID  D/c CVL  RENAL  A:  UTI with E.COLI Hx of ureteral stent in 2014  Hypernatremia Contraction alkalosis  Stay negative ~ 5 liters P:  Trend BMP, Mg, Phos  See ID Hold further Lasix as contraction alkalosis may be contributing to hypercarbia Start Diamox   GASTROINTESTINAL  A:  GI Px is not indicated  Nutrition  P:  HH diet   HEMATOLOGIC  A:  Anemia  Thrombocytopenia  VTE Px  P:  Follow CBC  SCD  Heparin SQ  INFECTIOUS  A:  UTI with E.COLI P:  Cipro as above  ENDOCRINE  A:  Subclinical hypothyroidism  P:  Defer Levothyroxine  Outpatient follow  up  NEUROLOGIC  A:  Acute encephalopathy, improving  Hx of depression  P:  Zoloft   Charlann Lange, MD PGY-3  IMTS  08/04/2013, 6:37 AM  Transfer to SDU under PCCM.  I have personally obtained history, examined patient, evaluated and interpreted laboratory and imaging results, reviewed medical records, formulated assessment / plan and placed orders.  Doree Fudge, MD Pulmonary and [place] [facility] Pager: [phone]  08/04/2013, 9:21 AM

## 2013-08-04 NOTE — Progress Notes (Signed)
Subjective:  Up in chair today.   Not too SOB.  Experimenting with type of pressure support at night.  Still with sig CO2 retention.  Objective:  Vital Signs in the last 24 hours: BP 150/85  Pulse 56  Temp(Src) 98.2 F (36.8 C) (Oral)  Resp 25  Ht 5\' 4"  (1.626 m)  Wt 88.2 kg (194 lb 7.1 oz)  BMI 33.36 kg/m2  SpO2 98%  Physical Exam: Pleasant WF in NAD Lungs:  Decreased BSCardiac:  Regular rhythm, normal S1 and S2, no S3 Abdomen:  Soft, nontender, no masses Extremities:  No edema present, chronic venous stasis changes  Intake/Output from previous day: 04/27 0701 - 04/28 0700 In: 495.3 [P.O.:360; I.V.:35.3; IV Piggyback:100] Out: 2400 [Urine:2400] Weight Filed Weights   08/02/13 0500 08/03/13 0400 08/04/13 0600  Weight: 90.2 kg (198 lb 13.7 oz) 89.6 kg (197 lb 8.5 oz) 88.2 kg (194 lb 7.1 oz)    Lab Results: Basic Metabolic Panel:  Recent Labs  08/03/13 1600 08/04/13 0400  NA 146 148*  K 4.0 3.8  CL 99 101  CO2 37* 38*  GLUCOSE 96 92  BUN 27* 21  CREATININE 0.88 0.80    CBC:  Recent Labs  08/02/13 0300  08/03/13 0420 08/04/13 0400  WBC  --   < > 9.2 7.0  NEUTROABS 7.7  --   --   --   HGB  --   < > 7.9* 8.8*  HCT  --   < > 26.6* 29.9*  MCV  --   < > 91.1 94.0  PLT  --   < > 92* 99*  < > = values in this interval not displayed.  BNP    Component Value Date/Time   PROBNP 12114.0* 08/01/2013 0952    PROTIME: Lab Results  Component Value Date   INR 1.09 08/02/2013   INR 1.15 08/01/2013    Telemetry: sinus  Assessment/Plan:  1. Acute on chronic resp failure with CO2 retention 2. Acute on chronic diastolic dysfunction better 3. Contraction alkalosis 4. Anemia may be contributing  Rec:  CPAP being looked at.  Diamox added today to help with contraction alkalosis.     Kerry Hough  MD [facility] Cardiology  08/04/2013, 12:40 PM

## 2013-08-04 NOTE — Evaluation (Signed)
Physical Therapy Evaluation Patient Details Name: Laura [name] MRN: [id number] DOB: 02-Nov-1929 Today's Date: 08/04/2013   History of Present Illness  Pt is a 80 y/o female admitted with CHF exacerbation after SOB and syncope.  Clinical Impression  Pt admitted following syncope and SOB. Pt currently with functional limitations due to the deficits listed below (see PT Problem List). At the time of PT eval, pt able to perform basic transfers with mod assist +2. Pt will benefit from skilled PT to increase their independence and safety with mobility to allow discharge to the venue listed below.    Follow Up Recommendations SNF;Supervision for mobility/OOB    Equipment Recommendations  None recommended by PT    Recommendations for Other Services       Precautions / Restrictions Restrictions Weight Bearing Restrictions: No      Mobility  Bed Mobility Overal bed mobility: Needs Assistance Bed Mobility: Supine to Sit     Supine to sit: Mod assist     General bed mobility comments: VC's for sequencing and technique. Hand-over-hand assist to reach for bed rail. Bed pad used to assist with scooting to EOB.  Transfers Overall transfer level: Needs assistance Equipment used: Rolling walker (2 wheeled)   Sit to Stand: Mod assist;+2 physical assistance Stand pivot transfers: Mod assist;+2 physical assistance       General transfer comment: VC's for hand placement on seated surface for safety. Assist required to power-up to full stand and steady pt. Verbal and tactile cueing to get COG over BOS.   Ambulation/Gait Ambulation/Gait assistance: Min guard Ambulation Distance (Feet): 7 Feet Assistive device: Rolling walker (2 wheeled) Gait Pattern/deviations: Decreased stride length;Shuffle;Trunk flexed Gait velocity: Decreased Gait velocity interpretation: Below normal speed for age/gender General Gait Details: Pt able to ambulate in room a short distance (limited by set-up of  room). VC's for sequencing and safety awareness with the RW.   Stairs            Wheelchair Mobility    Modified Rankin (Stroke Patients Only)       Balance Overall balance assessment: Needs assistance Sitting-balance support: Feet supported;No upper extremity supported Sitting balance-Leahy Scale: Fair     Standing balance support: Bilateral upper extremity supported Standing balance-Leahy Scale: Poor Standing balance comment: Requires UE support on walker to maintain balance                              Pertinent Vitals/Pain Pt on supplemental O2 throughout session with decrease in sats with transfers/ambulation. At lowest point sats read 77%, however feel this may have been incorrect as pt holding walker very tightly which was restricting blood flow to her fingers.     Home Living Family/patient expects to be discharged to:: Private residence Living Arrangements: Spouse/significant other Available Help at Discharge: Family;Available 24 hours/day Type of Home: Other(Comment) ([place] home) Home Access: Stairs to enter Entrance Stairs-Rails: Can reach both Entrance Stairs-Number of Steps: 2 Home Layout: One level;Able to live on main level with bedroom/bathroom Home Equipment: Walker - 4 wheels;Cane - single point;Bedside commode      Prior Function Level of Independence: Independent with assistive device(s)         Comments: Used w/c if going out in the community, cane use at home all the time.      Hand Dominance   Dominant Hand: Right    Extremity/Trunk Assessment   Upper Extremity Assessment: Defer to OT evaluation  Lower Extremity Assessment: Generalized weakness      Cervical / Trunk Assessment: Kyphotic  Communication   Communication: No difficulties  Cognition Arousal/Alertness: Awake/alert Behavior During Therapy: WFL for tasks assessed/performed Overall Cognitive Status: Within Functional Limits for tasks assessed                       General Comments      Exercises        Assessment/Plan    PT Assessment Patient needs continued PT services  PT Diagnosis Generalized weakness;Difficulty walking   PT Problem List Decreased strength;Decreased activity tolerance;Decreased range of motion;Decreased balance;Decreased mobility;Decreased knowledge of use of DME;Decreased safety awareness;Decreased knowledge of precautions;Cardiopulmonary status limiting activity  PT Treatment Interventions DME instruction;Gait training;Stair training;Functional mobility training;Therapeutic activities;Therapeutic exercise;Neuromuscular re-education;Patient/family education   PT Goals (Current goals can be found in the Care Plan section) Acute Rehab PT Goals Patient Stated Goal: To return home PT Goal Formulation: With patient/family Time For Goal Achievement: 08/18/13 Potential to Achieve Goals: Good    Frequency Min 2X/week   Barriers to discharge        Co-evaluation               End of Session Equipment Utilized During Treatment: Gait belt;Oxygen Activity Tolerance: Patient tolerated treatment well Patient left: in chair;with call bell/phone within reach;with family/visitor present Nurse Communication: Mobility status         Time: 6701-4103 PT Time Calculation (min): 24 min   Charges:   PT Evaluation $Initial PT Evaluation Tier I: 1 Procedure PT Treatments $Therapeutic Activity: 8-22 mins   PT G Codes:          Jolyn Lent 08/04/2013, 5:29 PM  Jolyn Lent, PT, DPT Acute Rehabilitation Services Pager: [phone]

## 2013-08-05 ENCOUNTER — Inpatient Hospital Stay (HOSPITAL_COMMUNITY): Payer: Medicare Other

## 2013-08-05 DIAGNOSIS — J9620 Acute and chronic respiratory failure, unspecified whether with hypoxia or hypercapnia: Secondary | ICD-10-CM | POA: Diagnosis present

## 2013-08-05 DIAGNOSIS — I5042 Chronic combined systolic (congestive) and diastolic (congestive) heart failure: Secondary | ICD-10-CM | POA: Diagnosis present

## 2013-08-05 DIAGNOSIS — E669 Obesity, unspecified: Secondary | ICD-10-CM | POA: Insufficient documentation

## 2013-08-05 LAB — BASIC METABOLIC PANEL
BUN: 19 mg/dL (ref 6–23)
BUN: 20 mg/dL (ref 6–23)
CO2: 36 mEq/L — ABNORMAL HIGH (ref 19–32)
CO2: 33 meq/L — AB (ref 19–32)
CREATININE: 0.79 mg/dL (ref 0.50–1.10)
Calcium: 9.1 mg/dL (ref 8.4–10.5)
Calcium: 9.3 mg/dL (ref 8.4–10.5)
Chloride: 103 mEq/L (ref 96–112)
Chloride: 103 mEq/L (ref 96–112)
Creatinine, Ser: 0.82 mg/dL (ref 0.50–1.10)
GFR calc Af Amer: 88 mL/min — ABNORMAL LOW (ref >90)
GFR calc Af Amer: 76 mL/min — ABNORMAL LOW (ref >90)
GFR calc non Af Amer: 65 mL/min — ABNORMAL LOW (ref >90)
GFR, EST NON AFRICAN AMERICAN: 76 mL/min — AB (ref >90)
GLUCOSE: 106 mg/dL — AB (ref 70–99)
Glucose, Bld: 174 mg/dL — ABNORMAL HIGH (ref 70–99)
POTASSIUM: 3.7 meq/L (ref 3.7–5.3)
Potassium: 4.1 mEq/L (ref 3.7–5.3)
SODIUM: 146 meq/L (ref 137–147)
Sodium: 146 mEq/L (ref 137–147)

## 2013-08-05 LAB — CBC
HCT: 31.1 % — ABNORMAL LOW (ref 36.0–46.0)
Hemoglobin: 9 g/dL — ABNORMAL LOW (ref 12.0–15.0)
MCH: 27 pg (ref 26.0–34.0)
MCHC: 28.9 g/dL — AB (ref 30.0–36.0)
MCV: 93.4 fL (ref 78.0–100.0)
PLATELETS: 115 10*3/uL — AB (ref 150–400)
RBC: 3.33 MIL/uL — ABNORMAL LOW (ref 3.87–5.11)
RDW: 15 % (ref 11.5–15.5)
WBC: 6.3 10*3/uL (ref 4.0–10.5)

## 2013-08-05 LAB — BLOOD GAS, ARTERIAL
ACID-BASE EXCESS: 10.1 mmol/L — AB (ref 0.0–2.0)
Bicarbonate: 35.8 mEq/L — ABNORMAL HIGH (ref 20.0–24.0)
DELIVERY SYSTEMS: POSITIVE
DRAWN BY: 41308
O2 CONTENT: 4 L/min
O2 SAT: 97.3 %
PATIENT TEMPERATURE: 98.6
PO2 ART: 76 mmHg — AB (ref 80.0–100.0)
TCO2: 37.7 mmol/L (ref 0–100)
pCO2 arterial: 64.7 mmHg (ref 35.0–45.0)
pH, Arterial: 7.361 (ref 7.350–7.450)

## 2013-08-05 LAB — MAGNESIUM
Magnesium: 2.2 mg/dL (ref 1.5–2.5)
Magnesium: 2.3 mg/dL (ref 1.5–2.5)

## 2013-08-05 LAB — PHOSPHORUS
PHOSPHORUS: 4.6 mg/dL (ref 2.3–4.6)
Phosphorus: 3.4 mg/dL (ref 2.3–4.6)

## 2013-08-05 MED ORDER — AMLODIPINE BESYLATE 5 MG PO TABS
5.0000 mg | ORAL_TABLET | Freq: Every day | ORAL | Status: DC
  Administered 2013-08-05 – 2013-08-11 (×7): 5 mg via ORAL
  Filled 2013-08-05 (×10): qty 1

## 2013-08-05 MED ORDER — CEPHALEXIN 500 MG PO CAPS
500.0000 mg | ORAL_CAPSULE | Freq: Two times a day (BID) | ORAL | Status: DC
  Administered 2013-08-05: 500 mg via ORAL
  Filled 2013-08-05 (×4): qty 1

## 2013-08-05 MED ORDER — ISOSORBIDE MONONITRATE ER 30 MG PO TB24
30.0000 mg | ORAL_TABLET | Freq: Every day | ORAL | Status: DC
  Administered 2013-08-05 – 2013-08-11 (×7): 30 mg via ORAL
  Filled 2013-08-05 (×8): qty 1

## 2013-08-05 NOTE — Progress Notes (Signed)
Subjective:  Somnolent today.  Most of the day in bed and sleeping.  BP is high.    Objective:  Vital Signs in the last 24 hours: BP 148/63  Pulse 60  Temp(Src) 98.2 F (36.8 C) (Oral)  Resp 25  Ht 5\' 4"  (1.626 m)  Wt 88.2 kg (194 lb 7.1 oz)  BMI 33.36 kg/m2  SpO2 100%  Physical Exam: Pleasant WF in NAD Lungs:  Decreased BS Cardiac:  Regular rhythm, normal S1 and S2, no S3 Abdomen:  Soft, nontender, no masses Extremities:  No edema present, chronic venous stasis changes  Intake/Output from previous day: 04/28 0701 - 04/29 0700 In: 543 [P.O.:540; I.V.:3] Out: 1315 [Urine:1315]  Weight Filed Weights   08/02/13 0500 08/03/13 0400 08/04/13 0600  Weight: 90.2 kg (198 lb 13.7 oz) 89.6 kg (197 lb 8.5 oz) 88.2 kg (194 lb 7.1 oz)    Lab Results: Basic Metabolic Panel:  Recent Labs  08/04/13 1700 08/05/13 0440  NA 148* 146  K 4.1 3.7  CL 102 103  CO2 37* 36*  GLUCOSE 179* 106*  BUN 21 19  CREATININE 0.79 0.79    CBC:  Recent Labs  08/04/13 0400 08/05/13 0440  WBC 7.0 6.3  HGB 8.8* 9.0*  HCT 29.9* 31.1*  MCV 94.0 93.4  PLT 99* 115*    BNP    Component Value Date/Time   PROBNP 12114.0* 08/01/2013 0952    PROTIME: Lab Results  Component Value Date   INR 1.09 08/02/2013   INR 1.15 08/01/2013    Telemetry: sinus  Assessment/Plan:  1. Acute on chronic resp failure with CO2 retention 2. Acute on chronic diastolic dysfunction better 3. Contraction alkalosis 4. Anemia may be contributing 5. Hypertension not well controlled  Rec:  CPAP at night.  She needs to be up in chair. Control blood pressure.Husband asks about rehab post hospital.     W. Doristine Church  MD [facility] Cardiology  08/05/2013, 2:28 PM

## 2013-08-05 NOTE — Progress Notes (Signed)
PULMONARY / CRITICAL CARE MEDICINE   Name: Laura [name] MRN: [id number] DOB: 02/19/1930    ADMISSION DATE:  07/31/2013 CONSULTATION DATE:  08/05/13  REFERRING MD : [facility]  PRIMARY SERVICE: PCCM   CHIEF COMPLAINT: Acute respiratory failure   BRIEF PATIENT DESCRIPTION: 80 yo with diastolic CHF, OSA, asthma (?) admitted 4/24 with dyspnea and syncope. Patient was somnolent, chest x-ray and physical exam findings were consistent with CHF. ProBNP elevated 7176. Trop negative X 1. PCCM was asked to consult given worsening respiratory status.   SIGNIFICANT EVENTS / STUDIES:  4/24 Admitted for CHF and respiratory failure  4/25 Intubated  4/25 Echo >>> EF 50% to 55%. grade 1 diastolic dysfunction. RV dilated. PA peak pressure: 58mm Hg (S).  4/26 Hypotensive due to diuresis and antihypertensive meds. CVP 3. Received NS bolus  4/26 Extubation  4/28 nCPAP started  LINES / TUBES:  ETT 4/25 >>> 4/26  R IJ CVP 4/25 >>>  OGT 4/25 >>> 4/26  R radial A line 4/25 >>> 4/27   CULTURES:  4/25 Blood >>>  4/25 Respiratory >>> Neg  4/26 Blood >>>  4/26 Urine >>> ESCHERICHIA COLI pensensitive 4/26 Respiratory >>> Neg  ANTIBIOTICS:  Ciprofloxacin 4/27 >>>  INTERVAL HISTORY:  Tolerate CPAP well overnight. No c/o SOB. CO2 retention improved.  Neg 5.7 L and weight down 11 lbs since admission.  VITAL SIGNS: Temp:  [97.4 F (36.3 C)-99.7 F (37.6 C)] 98.9 F (37.2 C) (04/29 0036) Pulse Rate:  [56-79] 60 (04/28 2346) Resp:  [16-32] 17 (04/29 0438) BP: (123-187)/(51-103) 172/70 mmHg (04/29 0438) SpO2:  [92 %-100 %] 100 % (04/29 0438) FiO2 (%):  [30 %] 30 % (04/28 0727)  HEMODYNAMICS: CVP:  [7 mmHg] 7 mmHg  VENTILATOR SETTINGS: Vent Mode:  [-] BIPAP FiO2 (%):  [30 %] 30 % Set Rate:  [12 bmp] 12 bmp PEEP:  [6 cmH20] 6 cmH20  INTAKE / OUTPUT: Intake/Output     04/28 0701 - 04/29 0700   P.O. 540   I.V. (mL/kg) 3 (0)   Total Intake(mL/kg) 543 (6.2)   Urine (mL/kg/hr) 915 (0.4)   Total  Output 915   Net -372       Stool Occurrence 1 x     PHYSICAL EXAMINATION: General: chronic ill appearing. Lethargy but easily arousable  Neuro: alert and follow commands  HEENT: PERRL, no JVD or carotid bruit  Cardiovascular: Regular rhythm, no Murmur  Lungs: diminished. Bibasilar rales noted.  Abdomen: soft, distended, no guarding, Bowel sound present  Musculoskeletal: No leg edema  Skin: no rashes   LABS:  CBC  Recent Labs Lab 08/03/13 0420 08/04/13 0400 08/05/13 0440  WBC 9.2 7.0 6.3  HGB 7.9* 8.8* 9.0*  HCT 26.6* 29.9* 31.1*  PLT 92* 99* 115*   Coag's  Recent Labs Lab 08/01/13 1936 08/02/13 0835  APTT 167* 39*  INR 1.15 1.09   BMET  Recent Labs Lab 08/04/13 0400 08/04/13 1700 08/05/13 0440  NA 148* 148* 146  K 3.8 4.1 3.7  CL 101 102 103  CO2 38* 37* 36*  BUN 21 21 19   CREATININE 0.80 0.79 0.79  GLUCOSE 92 179* 106*   Electrolytes  Recent Labs Lab 08/04/13 0400 08/04/13 1700 08/05/13 0440  CALCIUM 9.0 9.3 9.1  MG 2.2 2.2 2.2  PHOS 3.4 2.7 3.4   Sepsis Markers  Recent Labs Lab 08/02/13 0830 08/02/13 0835 08/03/13 0420 08/04/13 0400  LATICACIDVEN 0.9  --   --   --   PROCALCITON  --  0.63 0.40 0.27   ABG  Recent Labs Lab 08/03/13 0420 08/04/13 0425 08/05/13 0425  PHART 7.390 7.344* 7.361  PCO2ART 60.4* 76.3* 64.7*  PO2ART 84.7 90.1 76.0*   Liver Enzymes  Recent Labs Lab 07/31/13 1731  AST 13  ALT 9  ALKPHOS 65  BILITOT 0.6  ALBUMIN 3.4*   Cardiac Enzymes  Recent Labs Lab 07/31/13 1731 08/01/13 0850 08/01/13 0952  TROPONINI  --  <0.30  --   PROBNP 7176.0*  --  12114.0*   Glucose  Recent Labs Lab 08/02/13 0356 08/02/13 0737 08/02/13 1129 08/02/13 1601 08/02/13 1933 08/04/13 0747  GLUCAP 126* 122* 97 119* 106* 96   IMAGING:  Dg Chest Port 1 View  08/04/2013   CLINICAL DATA:  Short of breath.  Pulmonary edema.  EXAM: PORTABLE CHEST - 1 VIEW  COMPARISON:  08/03/2013.  FINDINGS: Right IJ central  line is unchanged. There is no pneumothorax. Enlargement of the cardiopericardial silhouette appears similar to prior. Basilar predominant airspace disease compatible with cardiogenic pulmonary edema. Dense retrocardiac opacity appears similar, likely representing effusion, edema and atelectasis in combination. Probable small right pleural effusion. Monitoring leads project over the chest.  Compared to the prior exam 08/03/2013 at 0500 hr, there is little if any interval change.  IMPRESSION: No interval change in mild to moderate CHF. Unchanged right IJ central line.   Electronically Signed   By: Dereck Ligas M.D.   On: 08/04/2013 07:28   ASSESSMENT / PLAN:  PULMONARY  A:  Acute on chronic respiratory failure, improving Pulmonary edema, improving Pneumonia unlikely  OSA / OHS, tolerated nCPAP well with no hypercarbia in the morning Suspect undiagnosed COPD without exacerbation  DNI status  P:  Goal SpO2>92  Supplemental oxygen  Nasal auto PAP QHS Flovent  Albuterol PRN  Singulair  Polysomnography as outpatient, needs to be set up with [facility] Would d/c on nocturnal positive pressure therapy ( hypercarbia should qualify for DME without sleep study )  CARDIOVASCULAR  A:  Acute on chronic diastolic heart failure  Transient hypotension, resolved with fluids  HTN  CAD  AF  Thoracic aortic aneurysm ( 4.3 cm - unchanged, Dr. Sherren Mocha Early )  Possible pulmonary hypertension secondary to chronic heart / lung disease  DNR status  P:  ASA, Metoprolol, Lipitor  Add home dose Imdur 30 mg QD Hold Diovan  D/c CVL if able to obtain PIV  RENAL  A:  UTI with E.COLI  Hx of ureteral stent in 2014  Hypernatremia  Contraction alkalosis, improving Stay negative ~ 6 liters  P:  Trend BMP, Mg, Phos  See ID  Hold further Lasix as contraction alkalosis may be contributing to hypercarbia  Continue Diamox   GASTROINTESTINAL  A:  GI Px is not indicated  Nutrition  P:  HH diet    HEMATOLOGIC  A:  Anemia  Thrombocytopenia  VTE Px  P:  Follow CBC  SCD  Heparin SQ   INFECTIOUS  A:  UTI with E.COLI  P:  Cipro as above x 7 days  ENDOCRINE  A:  Subclinical hypothyroidism  P:  Defer Levothyroxine  Outpatient follow up   NEUROLOGIC  A:  Acute encephalopathy, improving  Hx of depression  P:  Zoloft   Charlann Lange, MD PGY-3  IMTS  08/05/2013, 6:30 AM  Transfer to telemetry under PCCM.  Possible d/c 4/30.  I have personally obtained history, examined patient, evaluated and interpreted laboratory and imaging results, reviewed medical records, formulated assessment / plan  and placed orders.  Doree Fudge, MD Pulmonary and [place] Pager: [phone]  08/05/2013, 8:50 AM

## 2013-08-05 NOTE — Care Management Note (Addendum)
    Page 1 of 1   08/07/2013     4:28:14 PM CARE MANAGEMENT NOTE 08/07/2013  Patient:  OREATHA, FABRY   Account Number:  [id number]  Date Initiated:  08/03/2013  Documentation initiated by:  Elissa Hefty  Subjective/Objective Assessment:   adm w chf     Action/Plan:   lives w husband, pcp dr mark perini   Per UR Regulation:  Reviewed for med. necessity/level of care/duration of stay  If discussed at [place] of Stay Meetings, dates discussed:   08/06/2013   Comments:  08/07/13 [place] MSN BSN CCM Pt to d/c on BiPAP per PCCM.  Per spouse, pt already has O2 @ home, cannot recall the name of the providing agency, will call CM with information. [phone]  TC to [employer] [phone].  Per rep with [employer], they do not supply BiPAP or CPAP.  Pt is willing to change companies if they can get a portable tank like they have with [employer].  Per [employer], they will not be able to provide BiPAP.  Talked with Maudie Mercury @ [facility] and they will be able to provide nocturnal BiPAP, pt can keep her O2 and portable tank through [employer].  Requested documentation faxed to [facility]. Will need order when ready for discharge.  If pt goes to SNF for rehab, [facility] will follow there and set up home equipment on discharge from SNF.  4/29 1517 debbie dowell rn,bsn saw md note that may need sleep study. have placed sleep study form for md to sign on shadow chart.

## 2013-08-05 NOTE — Progress Notes (Signed)
eLink Physician-Brief Progress Note Patient Name: Laura [name] DOB: 1929/07/07 MRN: [id number]  Date of Service  08/05/2013   HPI/Events of Note   Central line out. Lab unable to get blood draw  eICU Interventions  Okayed foot stick    Intervention Category Minor Interventions: Other:  Brand Males 08/05/2013, 7:06 PM

## 2013-08-06 ENCOUNTER — Inpatient Hospital Stay (HOSPITAL_COMMUNITY): Payer: Medicare Other

## 2013-08-06 DIAGNOSIS — I5032 Chronic diastolic (congestive) heart failure: Secondary | ICD-10-CM

## 2013-08-06 LAB — CBC
HEMATOCRIT: 32.1 % — AB (ref 36.0–46.0)
Hemoglobin: 9.2 g/dL — ABNORMAL LOW (ref 12.0–15.0)
MCH: 26.9 pg (ref 26.0–34.0)
MCHC: 28.7 g/dL — ABNORMAL LOW (ref 30.0–36.0)
MCV: 93.9 fL (ref 78.0–100.0)
PLATELETS: 136 10*3/uL — AB (ref 150–400)
RBC: 3.42 MIL/uL — AB (ref 3.87–5.11)
RDW: 15 % (ref 11.5–15.5)
WBC: 7 10*3/uL (ref 4.0–10.5)

## 2013-08-06 LAB — BASIC METABOLIC PANEL
BUN: 18 mg/dL (ref 6–23)
BUN: 20 mg/dL (ref 6–23)
CALCIUM: 9.2 mg/dL (ref 8.4–10.5)
CALCIUM: 9.5 mg/dL (ref 8.4–10.5)
CHLORIDE: 101 meq/L (ref 96–112)
CO2: 31 mEq/L (ref 19–32)
CO2: 33 mEq/L — ABNORMAL HIGH (ref 19–32)
Chloride: 104 mEq/L (ref 96–112)
Creatinine, Ser: 0.8 mg/dL (ref 0.50–1.10)
Creatinine, Ser: 0.86 mg/dL (ref 0.50–1.10)
GFR, EST AFRICAN AMERICAN: 78 mL/min — AB (ref >90)
GFR, EST AFRICAN AMERICAN: 71 mL/min — AB (ref >90)
GFR, EST NON AFRICAN AMERICAN: 67 mL/min — AB (ref >90)
GFR, EST NON AFRICAN AMERICAN: 62 mL/min — AB (ref >90)
Glucose, Bld: 110 mg/dL — ABNORMAL HIGH (ref 70–99)
Glucose, Bld: 119 mg/dL — ABNORMAL HIGH (ref 70–99)
Potassium: 4.1 mEq/L (ref 3.7–5.3)
Potassium: 4.4 mEq/L (ref 3.7–5.3)
Sodium: 145 mEq/L (ref 137–147)
Sodium: 144 mEq/L (ref 137–147)

## 2013-08-06 LAB — BLOOD GAS, ARTERIAL
Acid-Base Excess: 7.5 mmol/L — ABNORMAL HIGH (ref 0.0–2.0)
Bicarbonate: 34.5 mEq/L — ABNORMAL HIGH (ref 20.0–24.0)
Drawn by: 33100
O2 Content: 3 L/min
O2 Saturation: 98.4 %
PCO2 ART: 81 mmHg — AB (ref 35.0–45.0)
PH ART: 7.253 — AB (ref 7.350–7.450)
Patient temperature: 98.6
TCO2: 37 mmol/L (ref 0–100)
pO2, Arterial: 104 mmHg — ABNORMAL HIGH (ref 80.0–100.0)

## 2013-08-06 LAB — PHOSPHORUS: PHOSPHORUS: 4.3 mg/dL (ref 2.3–4.6)

## 2013-08-06 LAB — MAGNESIUM: MAGNESIUM: 2.4 mg/dL (ref 1.5–2.5)

## 2013-08-06 MED ORDER — DEXTROSE 5 % IV SOLN
1.0000 g | INTRAVENOUS | Status: AC
  Administered 2013-08-06 – 2013-08-09 (×4): 1 g via INTRAVENOUS
  Filled 2013-08-06 (×5): qty 10

## 2013-08-06 MED ORDER — FUROSEMIDE 40 MG PO TABS
40.0000 mg | ORAL_TABLET | Freq: Two times a day (BID) | ORAL | Status: DC
  Administered 2013-08-06 – 2013-08-10 (×9): 40 mg via ORAL
  Filled 2013-08-06 (×11): qty 1

## 2013-08-06 NOTE — Progress Notes (Signed)
Clinical Social Work Department BRIEF PSYCHOSOCIAL ASSESSMENT 08/06/2013  Patient:  Laura [name], Laura [name]     Account Number:  [id number]     [place] date:  07/31/2013  Clinical Social Worker:  Valda Lamb  Date/Time:  08/06/2013 03:23 PM  Referred by:  Physician  Date Referred:  08/06/2013 Referred for  SNF Placement   Other Referral:   Interview type:  Patient Other interview type:    PSYCHOSOCIAL DATA Living Status:  HUSBAND Admitted from facility:   Level of care:   Primary support name:  Erik Nessel [phone] Primary support relationship to patient:  SPOUSE Degree of support available:   Pt states she lives with her husband and her 2 daughters live nearby.    CURRENT CONCERNS Current Concerns  Post-Acute Placement   Other Concerns:    SOCIAL WORK ASSESSMENT / PLAN Covering CSW observed that PT recommended SNF placement.    CSW visited pt room and explored pt living situation. Pt states she lives with her husband in a town home. Pt states she sleeps on a recliner on the 1st floor and has access to the bathroom. Pt did state that she does not have 24hr care however her daughters live fairly close and are very supportive. CSW explored pt thoughts regarding SNF placement. Pt stated she would prefer to go home however will consider placement at [facility] where she has been before. Pt not agreeable to CSW faxing out to all of [facility].   Assessment/plan status:  Psychosocial Support/Ongoing Assessment of Needs Other assessment/ plan:   Information/referral to community resources:   Resources will be provided as needed.    PATIENT'S/FAMILY'S RESPONSE TO PLAN OF CARE: Pt alert and oriented and pleasant to speak with. Pt unsure as to whether she will agree to SNF placement. Pt prefers to go home but will consider placement at [facility].    Unit CSW to continue to follow.       Hunt Oris, MSW, [place]

## 2013-08-06 NOTE — Progress Notes (Signed)
Subjective:  Not doing well.  Moved to stepdown but retained CO2 overnight and now on BIPAP.    Objective:  Vital Signs in the last 24 hours: BP 126/53  Pulse 57  Temp(Src) 97.4 F (36.3 C) (Oral)  Resp 18  Ht 5\' 4"  (1.626 m)  Wt 88.2 kg (194 lb 7.1 oz)  BMI 33.36 kg/m2  SpO2 99%  Physical Exam: Pleasant WF on Bipap Lungs:  Decreased BS Cardiac:  Regular rhythm, normal S1 and S2, no S3 Abdomen:  Soft, nontender, no masses Extremities:  No edema present, chronic venous stasis changes  Intake/Output from previous day: 04/29 0701 - 04/30 0700 In: 660 [P.O.:660] Out: 700 [Urine:700]  Weight Filed Weights   08/02/13 0500 08/03/13 0400 08/04/13 0600  Weight: 90.2 kg (198 lb 13.7 oz) 89.6 kg (197 lb 8.5 oz) 88.2 kg (194 lb 7.1 oz)    Lab Results: Basic Metabolic Panel:  Recent Labs  08/05/13 1956 08/06/13 0455  NA 146 145  K 4.1 4.1  CL 103 104  CO2 33* 31  GLUCOSE 174* 110*  BUN 20 18  CREATININE 0.82 0.80    CBC:  Recent Labs  08/05/13 0440 08/06/13 0455  WBC 6.3 7.0  HGB 9.0* 9.2*  HCT 31.1* 32.1*  MCV 93.4 93.9  PLT 115* 136*    BNP    Component Value Date/Time   PROBNP 12114.0* 08/01/2013 0952   Telemetry: sinus  Assessment/Plan:  1. Acute on chronic resp failure with CO2 retention worse 2. Acute on chronic diastolic dysfunction better 3. Contraction alkalosis 4. Anemia may be contributing 5. Hypertension not well controlled  Rec:  Appears to have some resp depression ? Too much oxygen?  Suggest stop diamox at this time.  BP better controlled. Recheck BNP.   Kerry Hough  MD [facility] Cardiology  08/06/2013, 8:55 AM

## 2013-08-06 NOTE — Progress Notes (Signed)
Pt off BiPap placed on 1 l n/c 98 % 02 sats

## 2013-08-06 NOTE — Progress Notes (Signed)
PT Cancellation Note  Patient Details Name: ROMILDA PROBY MRN: [id number] DOB: 1929/04/11   Cancelled Treatment:    Reason Eval/Treat Not Completed: Medical issues which prohibited therapy (pt with medical decline and transferred to step down unit.   Shary Decamp Kwesi Sangha 08/06/2013, 8:58 AM  Suanne Marker PT [phone]

## 2013-08-06 NOTE — Progress Notes (Signed)
Report given to [facility] staff nurse.

## 2013-08-06 NOTE — Progress Notes (Signed)
OT Cancellation Note  Patient Details Name: Laura [name] MRN: [id number] DOB: May 01, 1929   Cancelled Treatment:    Reason Eval/Treat Not Completed: Patient not medically ready (transferring to [place] per staff) Ot to reattempt evaluation as appropriate.  Peri Maris Pager: [phone]  08/06/2013, 8:37 AM

## 2013-08-06 NOTE — Progress Notes (Deleted)
PULMONARY / CRITICAL CARE MEDICINE   Name: Laura [name] MRN: [id number] DOB: 1929-07-10    ADMISSION DATE:  07/31/2013 CONSULTATION DATE:  08/05/13  REFERRING MD : [facility]  PRIMARY SERVICE: PCCM   CHIEF COMPLAINT: Acute respiratory failure   BRIEF PATIENT DESCRIPTION: 80 yo with diastolic CHF, OSA, asthma (?) admitted 4/24 with dyspnea and syncope. Patient was somnolent, chest x-ray and physical exam findings were consistent with CHF. ProBNP elevated 7176. Trop negative X 1. PCCM was asked to consult given worsening respiratory status.   SIGNIFICANT EVENTS / STUDIES:  4/24 Admitted for CHF and respiratory failure  4/25 Intubated  4/25 Echo >>> EF 50% to 55%. grade 1 diastolic dysfunction. RV dilated. PA peak pressure: 50mm Hg (S).  4/26 Hypotensive due to diuresis and antihypertensive meds. CVP 3. Received NS bolus  4/26 Extubation  4/28 nCPAP started  LINES / TUBES:  ETT 4/25 >>> 4/26  R IJ CVP 4/25 >>>  OGT 4/25 >>> 4/26  R radial A line 4/25 >>> 4/27   CULTURES:  4/25 Blood >>>  4/25 Respiratory >>> Neg  4/26 Blood >>>  4/26 Urine >>> ESCHERICHIA COLI pensensitive 4/26 Respiratory >>> Neg  ANTIBIOTICS:  Ciprofloxacin 4/27 >>>4/28 Keflex 4/29>>>4/30 Rocephin 4/30>>>  INTERVAL HISTORY:  More somnolent overnight, improved with bipap.  Increased hypercarbia. Now back in SDU.   VITAL SIGNS: Temp:  [97.4 F (36.3 C)-98.3 F (36.8 C)] 97.4 F (36.3 C) (04/30 0452) Pulse Rate:  [48-73] 48 (04/30 0845) Resp:  [14-25] 14 (04/30 0845) BP: (126-148)/(53-63) 126/53 mmHg (04/30 0452) SpO2:  [95 %-100 %] 96 % (04/30 0845)     INTAKE / OUTPUT: Intake/Output     04/29 0701 - 04/30 0700 04/30 0701 - 05/01 0700   P.O. 660    I.V. (mL/kg)     Total Intake(mL/kg) 660 (7.5)    Urine (mL/kg/hr) 700 (0.3)    Total Output 700     Net -40          Urine Occurrence 3 x    Stool Occurrence 1 x      PHYSICAL EXAMINATION: General: chronic ill appearing.  Neuro: somnolent  at times but easily arousable.  HEENT: PERRL, no JVD or carotid bruit  Cardiovascular: Regular rhythm, no Murmur  Lungs: resps even non labored on bipap. diminished. Bibasilar rales noted.  Abdomen: soft, distended, no guarding, Bowel sound present  Musculoskeletal: No leg edema  Skin: no rashes   LABS:  CBC  Recent Labs Lab 08/04/13 0400 08/05/13 0440 08/06/13 0455  WBC 7.0 6.3 7.0  HGB 8.8* 9.0* 9.2*  HCT 29.9* 31.1* 32.1*  PLT 99* 115* 136*   Coag's  Recent Labs Lab 08/01/13 1936 08/02/13 0835  APTT 167* 39*  INR 1.15 1.09   BMET  Recent Labs Lab 08/05/13 0440 08/05/13 1956 08/06/13 0455  NA 146 146 145  K 3.7 4.1 4.1  CL 103 103 104  CO2 36* 33* 31  BUN 19 20 18   CREATININE 0.79 0.82 0.80  GLUCOSE 106* 174* 110*   Electrolytes  Recent Labs Lab 08/05/13 0440 08/05/13 1956 08/06/13 0455  CALCIUM 9.1 9.3 9.2  MG 2.2 2.3 2.4  PHOS 3.4 4.6 4.3   Sepsis Markers  Recent Labs Lab 08/02/13 0830 08/02/13 0835 08/03/13 0420 08/04/13 0400  LATICACIDVEN 0.9  --   --   --   PROCALCITON  --  0.63 0.40 0.27   ABG  Recent Labs Lab 08/04/13 0425 08/05/13 0425 08/06/13 0356  PHART  7.344* 7.361 7.253*  PCO2ART 76.3* 64.7* 81.0*  PO2ART 90.1 76.0* 104.0*   Liver Enzymes  Recent Labs Lab 07/31/13 1731  AST 13  ALT 9  ALKPHOS 65  BILITOT 0.6  ALBUMIN 3.4*   Cardiac Enzymes  Recent Labs Lab 07/31/13 1731 08/01/13 0850 08/01/13 0952  TROPONINI  --  <0.30  --   PROBNP 7176.0*  --  12114.0*   Glucose  Recent Labs Lab 08/02/13 0356 08/02/13 0737 08/02/13 1129 08/02/13 1601 08/02/13 1933 08/04/13 0747  GLUCAP 126* 122* 97 119* 106* 96   IMAGING:  Dg Chest Port 1 View  08/06/2013   CLINICAL DATA:  Respiratory failure.  EXAM: PORTABLE CHEST - 1 VIEW  COMPARISON:  08/05/2013.  FINDINGS: Right central line has been removed.  Prominent cardiomegaly.  Calcified tortuous aorta.  Limited evaluation of lung bases secondary to the  cardiomegaly. Appearance unchanged.  Central pulmonary vascular prominence without pulmonary edema. No gross pneumothorax.  IMPRESSION: Right central line has been removed otherwise no significant change as noted above.   Electronically Signed   By: Chauncey Cruel M.D.   On: 08/06/2013 08:19   Dg Chest Port 1 View  08/05/2013   CLINICAL DATA:  Respiratory failure.  EXAM: PORTABLE CHEST - 1 VIEW  COMPARISON:  DG CHEST 1V PORT dated 08/04/2013  FINDINGS: Right IJ line in stable position. Stable severe cardiomegaly present. Pulmonary venous congestion and mild interstitial prominence noted suggesting congestive heart failure. Small pleural effusions are noted. Basilar atelectasis and/or pneumonia cannot be excluded. The lung bases are difficult to evaluate due to the severe cardiomegaly. No pneumothorax. No acute osseous abnormality.  IMPRESSION: 1. Right IJ line in stable position. 2. Stable severe cardiomegaly with mild changes of congestive heart failure with mild pulmonary interstitial edema and bilateral pleural effusions. 3. Bibasilar atelectasis and/or pneumonia cannot be excluded. Lung bases are difficult to evaluate due to the severity of the cardiomegaly.   Electronically Signed   By: Marcello Moores  Register   On: 08/05/2013 07:38   ASSESSMENT / PLAN:  PULMONARY  A:  Acute on chronic respiratory failure, improving Pulmonary edema, improving Pneumonia unlikely  OSA / OHS, tolerated nCPAP well with no hypercarbia in the morning Suspect undiagnosed COPD without exacerbation  DNI status  P:  Supplemental O2 - goals sats 88-92%  BiPAP  Albuterol PRN  F/u CXR  Polysomnography as outpatient, needs to be set up with [facility] Would d/c on nocturnal positive pressure therapy ( hypercarbia should qualify for DME without sleep study )  CARDIOVASCULAR  A:  Acute on chronic diastolic heart failure  Transient hypotension, resolved with fluids  HTN  CAD  AF  Thoracic aortic aneurysm (4.3 cm -  unchanged, Dr. Sherren Mocha Early )  Possible pulmonary hypertension secondary to chronic heart / lung disease  DNR status  P:  ASA, Metoprolol, Lipitor  Imdur 30 mg QD Cards following  F/u BNP  Will need to change meds back to IV if mental status worsens   RENAL  A:  UTI with E.COLI  Hx of ureteral stent in 2014  Hypernatremia  Contraction alkalosis, improving Stay negative ~ 6 liters  P:  Trend BMP, Mg, Phos  See ID  Hold further lasix D/c Diamox   GASTROINTESTINAL  A:  GI Px is not indicated  Nutrition  P:  HH diet   HEMATOLOGIC  A:  Anemia  Thrombocytopenia  VTE Px  P:  Follow CBC  SCD  Heparin SQ   INFECTIOUS  A:  UTI with E.COLI  P:  Will change to IV rocephin with waxing/waning mental status  ENDOCRINE  A:  Subclinical hypothyroidism  P:  Defer Levothyroxine  Outpatient follow up   NEUROLOGIC  A:  Acute encephalopathy, improving  Hx of depression  P:  Zoloft   D/w Dr Wynonia Lawman.   Marijean Heath, NP 08/06/2013  9:01 AM Pager: [phone] or [phone]  I have personally obtained history, examined patient, evaluated and interpreted laboratory and imaging results, reviewed medical records, formulated assessment / plan and placed orders.  Doree Fudge, MD Pulmonary and [place] Pager: [phone]

## 2013-08-06 NOTE — Progress Notes (Signed)
Pt arrived from [facility] per bed. Alert Resp standing by with Bipap. Explained procedure of being on Bipap to help her breathing. Pt very cooperative. Oriented to room.

## 2013-08-06 NOTE — Progress Notes (Addendum)
Pt placed on CPAP at this time. Pt stated she wanted to go on around 2330.

## 2013-08-06 NOTE — Progress Notes (Addendum)
Clinical Social Work Department CLINICAL SOCIAL WORK PLACEMENT NOTE 08/06/2013  Patient:  LEASHA, GOLDBERGER  Account Number:  [id number] [place] date:  07/31/2013  Clinical Social Worker:  Hunt Oris, Latanya Presser  Date/time:  08/06/2013 03:35 PM  Clinical Social Work is seeking post-discharge placement for this patient at the following level of care:   SKILLED NURSING   (*CSW will update this form in Epic as items are completed)   08/06/2013  Patient/family provided with [place] Department of Clinical Social Work's list of facilities offering this level of care within the geographic area requested by the patient (or if unable, by the patient's family).  08/06/2013  Patient/family informed of their freedom to choose among providers that offer the needed level of care, that participate in Medicare, Medicaid or managed care program needed by the patient, have an available bed and are willing to accept the patient.  08/06/2013  Patient/family informed of [facility]' ownership interest in [facility], as well as of the fact that they are under no obligation to receive care at this facility.  PASARR submitted to EDS on 08/06/2013 PASARR number received from EDS on   FL2 transmitted to all facilities in geographic area requested by pt/family on  08/06/2013 FL2 transmitted to all facilities within larger geographic area on   Patient informed that his/her managed care company has contracts with or will negotiate with  certain facilities, including the following:     Patient/family informed of bed offers received:  08/10/13 Patient chooses bed at [facility] Physician recommends and patient chooses bed at    Patient to be transferred to [facility] on 08/11/13  Patient to be transferred to facility by [facility]  The following physician request were entered in Epic:   Additional Comments: Pt interested in [facility]. Going to [place] on 08/11/13. Facility completed paperwork with  pt/husband. PTAR scheduled for 3pm. RN informed. Pt/husband aware.  Hunt Oris, MSW, [place]

## 2013-08-06 NOTE — Progress Notes (Signed)
Dr Wynonia Lawman here & examined pt. HR noted to be 49 SB Hands cold rest of body warm. Tolerating Bipap well. Sats 99 % on 25 % 02. RR 19.

## 2013-08-06 NOTE — Progress Notes (Signed)
CRITICAL VALUE ALERT  Critical value received:  ABG results: CO2 81 pH 7.25  Date of notification: 08/06/13  Time of notification:  1610  Critical value read back:yes  Nurse who received alert:  DanitaPittsRN  MD notified (1st page):  Dr. Lake Bells  Time of first page:  0435  MD notified (2nd page):  Time of second page:  Responding MD:  Dr. Lake Bells  Time MD responded:  [phone]  No new orders received.

## 2013-08-06 NOTE — Progress Notes (Signed)
BiPap remains at bedside as needed. Pt wore CPAP last night but is refusing to where it tonight. She states at home she only uses 2L O2. No complications at this time. RT explained to patient if she felt SOB or experienced any difficulties to please call RN. RT will continue to monitor patient throughout night.

## 2013-08-07 ENCOUNTER — Inpatient Hospital Stay (HOSPITAL_COMMUNITY): Payer: Medicare Other

## 2013-08-07 LAB — CBC
HCT: 32.1 % — ABNORMAL LOW (ref 36.0–46.0)
Hemoglobin: 9.5 g/dL — ABNORMAL LOW (ref 12.0–15.0)
MCH: 27 pg (ref 26.0–34.0)
MCHC: 29.6 g/dL — ABNORMAL LOW (ref 30.0–36.0)
MCV: 91.2 fL (ref 78.0–100.0)
PLATELETS: 147 10*3/uL — AB (ref 150–400)
RBC: 3.52 MIL/uL — ABNORMAL LOW (ref 3.87–5.11)
RDW: 15.2 % (ref 11.5–15.5)
WBC: 7.5 10*3/uL (ref 4.0–10.5)

## 2013-08-07 LAB — BASIC METABOLIC PANEL
BUN: 22 mg/dL (ref 6–23)
BUN: 25 mg/dL — AB (ref 6–23)
CO2: 33 meq/L — AB (ref 19–32)
CO2: 32 mEq/L (ref 19–32)
CREATININE: 0.94 mg/dL (ref 0.50–1.10)
Calcium: 9.1 mg/dL (ref 8.4–10.5)
Calcium: 9.2 mg/dL (ref 8.4–10.5)
Chloride: 100 mEq/L (ref 96–112)
Chloride: 98 mEq/L (ref 96–112)
Creatinine, Ser: 1.01 mg/dL (ref 0.50–1.10)
GFR calc Af Amer: 59 mL/min — ABNORMAL LOW (ref >90)
GFR calc non Af Amer: 55 mL/min — ABNORMAL LOW (ref >90)
GFR calc non Af Amer: 51 mL/min — ABNORMAL LOW (ref >90)
GFR, EST AFRICAN AMERICAN: 64 mL/min — AB (ref >90)
GLUCOSE: 202 mg/dL — AB (ref 70–99)
Glucose, Bld: 101 mg/dL — ABNORMAL HIGH (ref 70–99)
POTASSIUM: 3.9 meq/L (ref 3.7–5.3)
Potassium: 3.8 mEq/L (ref 3.7–5.3)
SODIUM: 143 meq/L (ref 137–147)
Sodium: 145 mEq/L (ref 137–147)

## 2013-08-07 LAB — BLOOD GAS, ARTERIAL
Acid-Base Excess: 9.7 mmol/L — ABNORMAL HIGH (ref 0.0–2.0)
Bicarbonate: 35.5 mEq/L — ABNORMAL HIGH (ref 20.0–24.0)
Drawn by: 36496
O2 Content: 1 L/min
O2 Saturation: 98.5 %
PATIENT TEMPERATURE: 98.6
PH ART: 7.341 — AB (ref 7.350–7.450)
TCO2: 37.6 mmol/L (ref 0–100)
pCO2 arterial: 67.5 mmHg (ref 35.0–45.0)
pO2, Arterial: 91.8 mmHg (ref 80.0–100.0)

## 2013-08-07 LAB — CULTURE, BLOOD (ROUTINE X 2): Culture: NO GROWTH

## 2013-08-07 LAB — PRO B NATRIURETIC PEPTIDE: Pro B Natriuretic peptide (BNP): 3897 pg/mL — ABNORMAL HIGH (ref 0–450)

## 2013-08-07 LAB — PHOSPHORUS: Phosphorus: 3.9 mg/dL (ref 2.3–4.6)

## 2013-08-07 LAB — MAGNESIUM: Magnesium: 2.2 mg/dL (ref 1.5–2.5)

## 2013-08-07 MED ORDER — METOPROLOL TARTRATE 25 MG PO TABS
25.0000 mg | ORAL_TABLET | Freq: Two times a day (BID) | ORAL | Status: DC
  Administered 2013-08-07 – 2013-08-11 (×9): 25 mg via ORAL
  Filled 2013-08-07 (×10): qty 1

## 2013-08-07 NOTE — Progress Notes (Signed)
Occupational Therapy Evaluation Patient Details Name: Laura [name] MRN: [id number] DOB: Jun 12, 1929 Today's Date: 08/07/2013    History of Present Illness Pt is a 80 y/o female admitted with CHF exacerbation after SOB and syncope.   Clinical Impression   PTA, pt lived with husband and was mod I with ADL and mobility. Pt presents with decreased independence and is a fall risk. Feel safest plan is for pt to D/C to SNF for short  term rehab to facilitate safe return home. Pt ambulated @ 30 feet on 1L O2 @ RW level with min A. Desat to 85 after activity. Pt quickly returned to 95 after sitting on 1L O2. Dyspnea @ 2/4. Pt appears in agreement to return to [facility] for rehab. Pt will benefit from skilled OT services to facilitate D/C to next venue due to below deficits.    Follow Up Recommendations  SNF;Supervision/Assistance - 24 hour    Equipment Recommendations  Other (comment) (AE for ADL)    Recommendations for Other Services       Precautions / Restrictions Precautions Precautions: Fall Precaution Comments: watch O2 Sats      Mobility Bed Mobility    not assessed. Pt up in chair              Transfers Overall transfer level: Needs assistance   Transfers: Sit to/from Stand Sit to Stand: Mod assist Stand pivot transfers: Min assist       General transfer comment: Mod A from lower surface. vc for safe use of RW    Balance Overall balance assessment: Needs assistance Sitting-balance support: Feet supported Sitting balance-Leahy Scale: Good     Standing balance support: During functional activity Standing balance-Leahy Scale: Poor Standing balance comment: posterior lean at times                            ADL Overall ADL's : Needs assistance/impaired     Grooming: Minimal assistance   Upper Body Bathing: Minimal assitance;Sitting   Lower Body Bathing: Moderate assistance;Sit to/from stand Lower Body Bathing Details (indicate cue type  and reason): fatigues trying to reach over Upper Body Dressing : Minimal assistance   Lower Body Dressing: Moderate assistance;Sit to/from stand   Toilet Transfer: Moderate assistance (from recliner)   Toileting- Clothing Manipulation and Hygiene: Moderate assistance;Sit to/from stand Toileting - Clothing Manipulation Details (indicate cue type and reason): Pt preferred for OT to assist instead of trying to clean herself independentlyq     Functional mobility during ADLs: Moderate assistance;Rolling walker (once up, min A) General ADL Comments: Decline in function. Would benefit from AE and E conservaiton     Vision                     Perception     Praxis      Pertinent Vitals/Pain O2 85 1 L  O2 at other times in 90s.     Hand Dominance Right   Extremity/Trunk Assessment Upper Extremity Assessment Upper Extremity Assessment: Generalized weakness   Lower Extremity Assessment Lower Extremity Assessment: Generalized weakness   Cervical / Trunk Assessment Cervical / Trunk Assessment: Kyphotic   Communication Communication Communication: No difficulties   Cognition Arousal/Alertness: Awake/alert Behavior During Therapy: WFL for tasks assessed/performed Overall Cognitive Status: Impaired/Different from baseline Area of Impairment: Memory;Problem solving     Memory: Decreased short-term memory       Problem Solving: Slow processing;Requires verbal cues;Difficulty sequencing  General Comments       Exercises Exercises: Other exercises Other Exercises Other Exercises: encouraged general AROM chair level Educated on importance of leaving feet on floor in order to complete exercise, and then elevate BLE due to circulation needs.    Shoulder Instructions      Home Living Family/patient expects to be discharged to:: Skilled nursing facility                                        Prior Functioning/Environment Level of Independence:  Independent with assistive device(s)        Comments: Pt staes LB ADL and hygiene after toielting were dificult for her    OT Diagnosis: Generalized weakness   OT Problem List: Decreased strength;Decreased activity tolerance;Impaired balance (sitting and/or standing);Decreased safety awareness;Decreased knowledge of use of DME or AE;Cardiopulmonary status limiting activity;Obesity   OT Treatment/Interventions: Self-care/ADL training;Therapeutic exercise;Energy conservation;Therapeutic activities;Patient/family education;Balance training    OT Goals(Current goals can be found in the care plan section) Acute Rehab OT Goals Patient Stated Goal:  (to be able to do more) OT Goal Formulation: With patient Time For Goal Achievement: 08/21/13 Potential to Achieve Goals: Good  OT Frequency: Min 2X/week   Barriers to D/C: Decreased caregiver support  elderly husband is primary caretaker s/p recent back surgery       Co-evaluation              End of Session Equipment Utilized During Treatment: Gait belt;Rolling walker;Oxygen Nurse Communication: Mobility status  Activity Tolerance: Patient limited by fatigue Patient left: in chair;with family/visitor present;with call bell/phone within reach   Time: 1201-1234 OT Time Calculation (min): 33 min Charges:  OT General Charges $OT Visit: 1 Procedure OT Evaluation $Initial OT Evaluation Tier I: 1 Procedure OT Treatments $Self Care/Home Management : 23-37 mins G-Codes:    Roney Jaffe Laura [name] 2013/08/14, 2:46 PM   [place], OTR/L  [phone] 08-14-13

## 2013-08-07 NOTE — Progress Notes (Signed)
Subjective:  Much better today now on lower amounts of oxygen.  Objective:  Vital Signs in the last 24 hours: BP 98/66  Pulse 57  Temp(Src) 98.3 F (36.8 C) (Oral)  Resp 19  Ht 5\' 5"  (1.651 m)  Wt 88.2 kg (194 lb 7.1 oz)  BMI 32.36 kg/m2  SpO2 97%  Physical Exam: Pleasant WF on Bipap Lungs:  Decreased BS Cardiac:  Regular rhythm, normal S1 and S2, no S3 Abdomen:  Soft, nontender, no masses Extremities:  No edema present, chronic venous stasis changes  Intake/Output from previous day: 04/30 0701 - 05/01 0700 In: 886 [P.O.:880; I.V.:6] Out: 500 [Urine:500]  Weight Filed Weights   08/04/13 0600 08/06/13 0900 08/07/13 0500  Weight: 88.2 kg (194 lb 7.1 oz) 90 kg (198 lb 6.6 oz) 88.2 kg (194 lb 7.1 oz)    Lab Results: Basic Metabolic Panel:  Recent Labs  08/06/13 1710 08/07/13 0355  NA 144 145  K 4.4 3.8  CL 101 100  CO2 33* 33*  GLUCOSE 119* 101*  BUN 20 22  CREATININE 0.86 0.94    CBC:  Recent Labs  08/06/13 0455 08/07/13 0355  WBC 7.0 7.5  HGB 9.2* 9.5*  HCT 32.1* 32.1*  MCV 93.9 91.2  PLT 136* 147*    BNP    Component Value Date/Time   PROBNP 3897.0* 08/07/2013 0355   Telemetry: Sinus, some bradycardia overnight.  Assessment/Plan:  1. Acute on chronic resp failure with CO2 retention imporved2. Acute on chronic diastolic dysfunction better 3. Contraction alkalosis improved  4. Anemia may be contributing 5. Hypertension somewhat better controlled  Rec:  Metoprolol was reduced because of bradycardia - agree.  Will leave choice of diuretics to pulmonary although if retains will need to go back on furosemide.  Post hospital planning in process husband has a lot of questions.   Kerry Hough  MD [facility] Cardiology  08/07/2013, 1:20 PM

## 2013-08-07 NOTE — Progress Notes (Addendum)
PT Cancellation Note  Patient Details Name: BERNEDETTE AUSTON MRN: [id number] DOB: [birth year]   Cancelled Treatment:    Reason Eval/Treat Not Completed: Fatigue/lethargy limiting ability to participate. Pt and husband report (RN confirms) that pt just completed OT session within the hour and she is too fatigued to walk again or do LE exercises right now. (Reports she did 100 "leg kicks" with each leg after OT left). Pt awaiting lunch and states she would do more later.  1738- Attempted to see pt @ 1545 and pt gone for respiratory testing.   Jeanie Cooks Piers Baade 08/07/2013, 1:42 PM Pager [phone]

## 2013-08-07 NOTE — Progress Notes (Signed)
CRITICAL VALUE ALERT  Critical value received:  Panic ABG results: ph 7.34, Co2 67.5, PO2 92, Bicarb 36, Sats 98  Date of notification:  08/07/13  Time of notification:  3016  Critical value read back:yes  Nurse who received alert:  Elizbeth Squires RN  MD notified (1st page):  CCMD Elzie Rings  Time of first page:  [phone]  Responding MD:  Elzie Rings  Time MD responded:  3235  No further orders at this time.  Jeannie Fend 08/07/2013 5732

## 2013-08-07 NOTE — Progress Notes (Addendum)
PULMONARY / CRITICAL CARE MEDICINE   Name: Laura [name] MRN: [id number] DOB: 1929/07/05    ADMISSION DATE:  07/31/2013 CONSULTATION DATE:  08/05/13  REFERRING MD : [facility]  PRIMARY SERVICE: PCCM   CHIEF COMPLAINT: Acute respiratory failure   BRIEF PATIENT DESCRIPTION: 80 yo with diastolic CHF, OSA, asthma (?) admitted 4/24 with dyspnea and syncope. Patient was somnolent, chest x-ray and physical exam findings were consistent with CHF. ProBNP elevated 7176. Trop negative X 1. PCCM was asked to consult given worsening respiratory status.   SIGNIFICANT EVENTS / STUDIES:  4/24 Admitted for CHF and respiratory failure  4/25 Intubated  4/25 Echo >>> EF 50% to 55%. grade 1 diastolic dysfunction. RV dilated. PA peak pressure: 55mm Hg (S).  4/26 Hypotensive due to diuresis and antihypertensive meds. CVP 3. Received NS bolus  4/26 Extubation  4/28 nCPAP started 4/29 Transferred to telemetry 4/30 Transferred back to SDU as hypercarbic ( of note - no distress at the time hypercarbia was documented, mental status at baseline ) 5/1   Bradycardic overnight, Metoprolol held  LINES / TUBES:  ETT 4/25 >>> 4/26  R IJ CVP 4/25 >>>  OGT 4/25 >>> 4/26  R radial A line 4/25 >>> 4/27   CULTURES:  4/25 Blood >>>  4/25 Respiratory >>> Neg  4/26 Blood >>>  4/26 Urine >>> ESCHERICHIA COLI pensensitive 4/26 Respiratory >>> Neg  ANTIBIOTICS:  Ciprofloxacin 4/27 >>> 4/30 Ceftriaxone 4/30 >>>  INTERVAL HISTORY: Bradycardic in 40's, metoprolol held.  VITAL SIGNS: Temp:  [97.8 F (36.6 C)-98.7 F (37.1 C)] 98.1 F (36.7 C) (05/01 0748) Pulse Rate:  [47-69] 55 (05/01 0748) Resp:  [19-31] 26 (05/01 0748) BP: (100-147)/(51-69) 147/66 mmHg (05/01 0748) SpO2:  [91 %-99 %] 95 % (05/01 0748) FiO2 (%):  [25 %] 25 % (04/30 1100) Weight:  [88.2 kg (194 lb 7.1 oz)] 88.2 kg (194 lb 7.1 oz) (05/01 0500)  HEMODYNAMICS:   VENTILATOR SETTINGS: Vent Mode:  [-]  FiO2 (%):  [25 %] 25 %  INTAKE /  OUTPUT: Intake/Output     04/30 0701 - 05/01 0700 05/01 0701 - 05/02 0700   P.O. 880 120   I.V. (mL/kg) 6 (0.1)    IV Piggyback  100   Total Intake(mL/kg) 886 (10) 220 (2.5)   Urine (mL/kg/hr) 500 (0.2)    Total Output 500     Net +386 +220        Urine Occurrence 2 x 1 x     PHYSICAL EXAMINATION: General: No distress Neuro: Awake, alert, mildly confused HEENT: Moist membranes Cardiovascular: Regular, no murmurs Lungs: Bilateral air entry, few rhonchi clearing with cough Abdomen: Soft, bowel sounds present Musculoskeletal: No leg edema  Skin: no rashes  LABS:  CBC  Recent Labs Lab 08/05/13 0440 08/06/13 0455 08/07/13 0355  WBC 6.3 7.0 7.5  HGB 9.0* 9.2* 9.5*  HCT 31.1* 32.1* 32.1*  PLT 115* 136* 147*   Coag's  Recent Labs Lab 08/01/13 1936 08/02/13 0835  APTT 167* 39*  INR 1.15 1.09   BMET  Recent Labs Lab 08/06/13 0455 08/06/13 1710 08/07/13 0355  NA 145 144 145  K 4.1 4.4 3.8  CL 104 101 100  CO2 31 33* 33*  BUN 18 20 22   CREATININE 0.80 0.86 0.94  GLUCOSE 110* 119* 101*   Electrolytes  Recent Labs Lab 08/05/13 1956 08/06/13 0455 08/06/13 1710 08/07/13 0355  CALCIUM 9.3 9.2 9.5 9.1  MG 2.3 2.4  --  2.2  PHOS 4.6 4.3  --  3.9   Sepsis Markers  Recent Labs Lab 08/02/13 0830 08/02/13 0835 08/03/13 0420 08/04/13 0400  LATICACIDVEN 0.9  --   --   --   PROCALCITON  --  0.63 0.40 0.27   ABG  Recent Labs Lab 08/05/13 0425 08/06/13 0356 08/07/13 0425  PHART 7.361 7.253* 7.341*  PCO2ART 64.7* 81.0* 67.5*  PO2ART 76.0* 104.0* 91.8   Liver Enzymes  Recent Labs Lab 07/31/13 1731  AST 13  ALT 9  ALKPHOS 65  BILITOT 0.6  ALBUMIN 3.4*   Cardiac Enzymes  Recent Labs Lab 07/31/13 1731 08/01/13 0850 08/01/13 0952 08/07/13 0355  TROPONINI  --  <0.30  --   --   PROBNP 7176.0*  --  12114.0* 3897.0*   Glucose  Recent Labs Lab 08/02/13 0356 08/02/13 0737 08/02/13 1129 08/02/13 1601 08/02/13 1933 08/04/13 0747   GLUCAP 126* 122* 97 119* 106* 96   IMAGING:  Dg Chest Port 1 View  08/07/2013   CLINICAL DATA:  Respiratory failure  EXAM: PORTABLE CHEST - 1 VIEW  COMPARISON:  August 06, 2013  FINDINGS: There is underlying emphysema. There is generalized cardiomegaly, stable. There is consolidation in both lung bases. There are questionable minimal effusions bilaterally. Elsewhere, lungs are clear. No adenopathy. No pneumothorax.  IMPRESSION: Cardiomegaly with questionable minimal effusions bilaterally. There is bilateral consolidation in the bases. Underlying emphysema. There is very little change from 1 day prior. Question a degree of congestive heart failure superimposed on emphysematous change. Pneumonia in the bases cannot be excluded on this study.   Electronically Signed   By: Lowella Grip M.D.   On: 08/07/2013 08:01   Dg Chest Port 1 View  08/06/2013   CLINICAL DATA:  Respiratory failure.  EXAM: PORTABLE CHEST - 1 VIEW  COMPARISON:  08/05/2013.  FINDINGS: Right central line has been removed.  Prominent cardiomegaly.  Calcified tortuous aorta.  Limited evaluation of lung bases secondary to the cardiomegaly. Appearance unchanged.  Central pulmonary vascular prominence without pulmonary edema. No gross pneumothorax.  IMPRESSION: Right central line has been removed otherwise no significant change as noted above.   Electronically Signed   By: Chauncey Cruel M.D.   On: 08/06/2013 08:19   ASSESSMENT / PLAN:  PULMONARY  A:  Acute on chronic respiratory failure OSA / OHS Nocturnal hypercarbia, seemed to improve on nCPAP Pulmonary edema, resolving Pneumonia unlikely  Suspect undiagnosed COPD without exacerbation  DNI status  P:  Goal SpO2>88 Supplemental oxygen, do NOT titrate up unless SpO2 is consistently below 88% BiPAP ( EPAP 5, IPAP 12, delta 7) qHS via nasal mask / portable machine Would d/c on nocturnal BiPAP ( hypercarbia / chronic respiratory failure should qualify for DME without sleep study  ) Do NOT check ABG unless acute change in mental status or respiratory distress Full PFT Flovent  Albuterol PRN  Singulair  Needs to be set up with [facility] upon discharge  CARDIOVASCULAR  A:  Asymptomatic bradycardia Acute on chronic diastolic heart failure  Transient hypotension, resolved with fluids  HTN  CAD  AF  Thoracic aortic aneurysm ( 4.3 cm - unchanged, Dr. Sherren Mocha Early )  Possible pulmonary hypertension secondary to chronic heart / lung disease  DNR status  P:  ASA, Lipito, Imdur, Norvasc Decrease Metoprolol to 25 bid Cardiology following  RENAL  A:  UTI with E.COLI  Hx of ureteral stent in 2014  Hypernatremia  Contraction alkalosis, improving Stay negative ~ 6 liters Good response to Diamox  P:  Trend BMP,  Mg, Phos  See ID  Lasix per Cardiology - I would prefer using Diamox as good clinical response and concern for Lasix causing contraction alkalosis, which in turn would drive CO2 into higher range where small changes in minute ventilation can cause large increases in CO2 level  GASTROINTESTINAL  A:  GI Px is not indicated  Nutrition  P:  Diet  HEMATOLOGIC  A:  Anemia  Thrombocytopenia  VTE Px  P:  Follow CBC  SCD  Heparin  INFECTIOUS  A:  UTI with E.COLI  P:  Abx x 7 days, stop date 5/3  ENDOCRINE  A:  Subclinical hypothyroidism  P:  Defer Levothyroxine  Outpatient follow up   NEUROLOGIC  A:  Acute encephalopathy, improving  Hx of depression  P:  Zoloft   I have personally obtained history, examined patient, evaluated and interpreted laboratory and imaging results, reviewed medical records, formulated assessment / plan and placed orders.  Doree Fudge, MD Pulmonary and [place] Pager: [phone]  08/07/2013, 9:47 AM

## 2013-08-07 NOTE — Progress Notes (Addendum)
PULMONARY / CRITICAL CARE MEDICINE   Name: Laura [name] MRN: [id number] DOB: 1929/11/18    ADMISSION DATE:  07/31/2013 CONSULTATION DATE:  08/05/13  REFERRING MD : [facility]  PRIMARY SERVICE: PCCM   CHIEF COMPLAINT: Acute respiratory failure   BRIEF PATIENT DESCRIPTION: 80 yo with diastolic CHF, OSA, asthma (?) admitted 4/24 with dyspnea and syncope. Patient was somnolent, chest x-ray and physical exam findings were consistent with CHF. ProBNP elevated 7176. Trop negative X 1. PCCM was asked to consult given worsening respiratory status.   SIGNIFICANT EVENTS / STUDIES:  4/24 Admitted for CHF and respiratory failure  4/25 Intubated  4/25 Echo >>> EF 50% to 55%. grade 1 diastolic dysfunction. RV dilated. PA peak pressure: 34mm Hg (S).  4/26 Hypotensive due to diuresis and antihypertensive meds. CVP 3. Received NS bolus  4/26 Extubation  4/28 nCPAP started  LINES / TUBES:  ETT 4/25 >>> 4/26  R IJ CVP 4/25 >>>  OGT 4/25 >>> 4/26  R radial A line 4/25 >>> 4/27   CULTURES:  4/25 Blood >>>  4/25 Respiratory >>> Neg  4/26 Blood >>>  4/26 Urine >>> ESCHERICHIA COLI pensensitive 4/26 Respiratory >>> Neg  ANTIBIOTICS:  Ciprofloxacin 4/27 >>>4/28 Keflex 4/29>>>4/30 Rocephin 4/30>>>  INTERVAL HISTORY:  More somnolent overnight, improved with bipap.  Increased hypercarbia. Now back in SDU.   VITAL SIGNS: Temp:  [97.8 F (36.6 C)-98.7 F (37.1 C)] 98.1 F (36.7 C) (05/01 0748) Pulse Rate:  [47-69] 55 (05/01 0748) Resp:  [19-31] 26 (05/01 0748) BP: (100-148)/(51-69) 148/62 mmHg (05/01 0953) SpO2:  [91 %-99 %] 95 % (05/01 0748) FiO2 (%):  [25 %] 25 % (04/30 1100) Weight:  [194 lb 7.1 oz (88.2 kg)] 194 lb 7.1 oz (88.2 kg) (05/01 0500)  Vent Mode:  [-]  FiO2 (%):  [25 %] 25 %  INTAKE / OUTPUT: Intake/Output     04/30 0701 - 05/01 0700 05/01 0701 - 05/02 0700   P.O. 880 120   I.V. (mL/kg) 6 (0.1)    IV Piggyback  100   Total Intake(mL/kg) 886 (10) 220 (2.5)   Urine  (mL/kg/hr) 500 (0.2)    Total Output 500     Net +386 +220        Urine Occurrence 2 x 1 x     PHYSICAL EXAMINATION: General: chronic ill appearing.  Neuro: somnolent at times but easily arousable.  HEENT: PERRL, no JVD or carotid bruit  Cardiovascular: Regular rhythm, no Murmur  Lungs: resps even non labored on bipap. diminished. Bibasilar rales noted.  Abdomen: soft, distended, no guarding, Bowel sound present  Musculoskeletal: No leg edema  Skin: no rashes   LABS:  CBC  Recent Labs Lab 08/05/13 0440 08/06/13 0455 08/07/13 0355  WBC 6.3 7.0 7.5  HGB 9.0* 9.2* 9.5*  HCT 31.1* 32.1* 32.1*  PLT 115* 136* 147*   Coag's  Recent Labs Lab 08/01/13 1936 08/02/13 0835  APTT 167* 39*  INR 1.15 1.09   BMET  Recent Labs Lab 08/06/13 0455 08/06/13 1710 08/07/13 0355  NA 145 144 145  K 4.1 4.4 3.8  CL 104 101 100  CO2 31 33* 33*  BUN 18 20 22   CREATININE 0.80 0.86 0.94  GLUCOSE 110* 119* 101*   Electrolytes  Recent Labs Lab 08/05/13 1956 08/06/13 0455 08/06/13 1710 08/07/13 0355  CALCIUM 9.3 9.2 9.5 9.1  MG 2.3 2.4  --  2.2  PHOS 4.6 4.3  --  3.9   Sepsis Markers  Recent  Labs Lab 08/02/13 0830 08/02/13 0835 08/03/13 0420 08/04/13 0400  LATICACIDVEN 0.9  --   --   --   PROCALCITON  --  0.63 0.40 0.27   ABG  Recent Labs Lab 08/05/13 0425 08/06/13 0356 08/07/13 0425  PHART 7.361 7.253* 7.341*  PCO2ART 64.7* 81.0* 67.5*  PO2ART 76.0* 104.0* 91.8   Liver Enzymes  Recent Labs Lab 07/31/13 1731  AST 13  ALT 9  ALKPHOS 65  BILITOT 0.6  ALBUMIN 3.4*   Cardiac Enzymes  Recent Labs Lab 07/31/13 1731 08/01/13 0850 08/01/13 0952 08/07/13 0355  TROPONINI  --  <0.30  --   --   PROBNP 7176.0*  --  12114.0* 3897.0*   Glucose  Recent Labs Lab 08/02/13 0356 08/02/13 0737 08/02/13 1129 08/02/13 1601 08/02/13 1933 08/04/13 0747  GLUCAP 126* 122* 97 119* 106* 96   IMAGING:  Dg Chest Port 1 View  08/07/2013   CLINICAL DATA:   Respiratory failure  EXAM: PORTABLE CHEST - 1 VIEW  COMPARISON:  August 06, 2013  FINDINGS: There is underlying emphysema. There is generalized cardiomegaly, stable. There is consolidation in both lung bases. There are questionable minimal effusions bilaterally. Elsewhere, lungs are clear. No adenopathy. No pneumothorax.  IMPRESSION: Cardiomegaly with questionable minimal effusions bilaterally. There is bilateral consolidation in the bases. Underlying emphysema. There is very little change from 1 day prior. Question a degree of congestive heart failure superimposed on emphysematous change. Pneumonia in the bases cannot be excluded on this study.   Electronically Signed   By: Lowella Grip M.D.   On: 08/07/2013 08:01   Dg Chest Port 1 View  08/06/2013   CLINICAL DATA:  Respiratory failure.  EXAM: PORTABLE CHEST - 1 VIEW  COMPARISON:  08/05/2013.  FINDINGS: Right central line has been removed.  Prominent cardiomegaly.  Calcified tortuous aorta.  Limited evaluation of lung bases secondary to the cardiomegaly. Appearance unchanged.  Central pulmonary vascular prominence without pulmonary edema. No gross pneumothorax.  IMPRESSION: Right central line has been removed otherwise no significant change as noted above.   Electronically Signed   By: Chauncey Cruel M.D.   On: 08/06/2013 08:19   ASSESSMENT / PLAN:  PULMONARY  A:  Acute on chronic respiratory failure, improving Pulmonary edema, improving Pneumonia unlikely  OSA / OHS, tolerated nCPAP well with no hypercarbia in the morning Suspect undiagnosed COPD without exacerbation  DNI status  P:  Supplemental O2 - goals sats 88-92%  BiPAP  Albuterol PRN  F/u CXR  Polysomnography as outpatient, needs to be set up with [facility] Would d/c on nocturnal positive pressure therapy ( hypercarbia should qualify for DME without sleep study )  CARDIOVASCULAR  A:  Acute on chronic diastolic heart failure  Transient hypotension, resolved with fluids   HTN  CAD  AF  Thoracic aortic aneurysm (4.3 cm - unchanged, Dr. Sherren Mocha Early )  Possible pulmonary hypertension secondary to chronic heart / lung disease  DNR status  P:  ASA, Metoprolol, Lipitor  Imdur 30 mg QD Cards following  F/u BNP  Will need to change meds back to IV if mental status worsens   RENAL  A:  UTI with E.COLI  Hx of ureteral stent in 2014  Hypernatremia  Contraction alkalosis, improving Stay negative ~ 6 liters  P:  Trend BMP, Mg, Phos  See ID  Hold further lasix D/c Diamox   GASTROINTESTINAL  A:  GI Px is not indicated  Nutrition  P:  HH diet   HEMATOLOGIC  A:  Anemia  Thrombocytopenia  VTE Px  P:  Follow CBC  SCD  Heparin SQ   INFECTIOUS  A:  UTI with E.COLI  P:  Will change to IV rocephin with waxing/waning mental status  ENDOCRINE  A:  Subclinical hypothyroidism  P:  Defer Levothyroxine  Outpatient follow up   NEUROLOGIC  A:  Acute encephalopathy, improving  Hx of depression  P:  Zoloft   D/w Dr Wynonia Lawman.   For services rendered on 08/06/2013.  CC time 35 min.  I have personally obtained history, examined patient, evaluated and interpreted laboratory and imaging results, reviewed medical records, formulated assessment / plan and placed orders.  Rush Farmer, M.D. [facility] Pulmonary/Critical Care Medicine. Pager: [phone]. After hours pager: [phone].

## 2013-08-08 DIAGNOSIS — R0602 Shortness of breath: Secondary | ICD-10-CM

## 2013-08-08 LAB — BLOOD GAS, ARTERIAL
Acid-Base Excess: 9.5 mmol/L — ABNORMAL HIGH (ref 0.0–2.0)
BICARBONATE: 34.5 meq/L — AB (ref 20.0–24.0)
DRAWN BY: 33234
Delivery systems: POSITIVE
Expiratory PAP: 5
FIO2: 0.3 %
INSPIRATORY PAP: 12
O2 SAT: 96.3 %
PCO2 ART: 57.2 mmHg — AB (ref 35.0–45.0)
PH ART: 7.398 (ref 7.350–7.450)
PO2 ART: 86.3 mmHg (ref 80.0–100.0)
Patient temperature: 98.6
TCO2: 36.3 mmol/L (ref 0–100)

## 2013-08-08 LAB — BASIC METABOLIC PANEL
BUN: 25 mg/dL — AB (ref 6–23)
BUN: 26 mg/dL — AB (ref 6–23)
CALCIUM: 9.1 mg/dL (ref 8.4–10.5)
CHLORIDE: 100 meq/L (ref 96–112)
CO2: 33 mEq/L — ABNORMAL HIGH (ref 19–32)
CO2: 32 meq/L (ref 19–32)
Calcium: 9.3 mg/dL (ref 8.4–10.5)
Chloride: 96 mEq/L (ref 96–112)
Creatinine, Ser: 0.94 mg/dL (ref 0.50–1.10)
Creatinine, Ser: 0.96 mg/dL (ref 0.50–1.10)
GFR calc Af Amer: 64 mL/min — ABNORMAL LOW (ref >90)
GFR calc Af Amer: 63 mL/min — ABNORMAL LOW (ref >90)
GFR calc non Af Amer: 55 mL/min — ABNORMAL LOW (ref >90)
GFR, EST NON AFRICAN AMERICAN: 54 mL/min — AB (ref >90)
GLUCOSE: 157 mg/dL — AB (ref 70–99)
Glucose, Bld: 96 mg/dL (ref 70–99)
POTASSIUM: 3.2 meq/L — AB (ref 3.7–5.3)
POTASSIUM: 4.1 meq/L (ref 3.7–5.3)
SODIUM: 144 meq/L (ref 137–147)
Sodium: 140 mEq/L (ref 137–147)

## 2013-08-08 LAB — CBC
HEMATOCRIT: 30.8 % — AB (ref 36.0–46.0)
HEMOGLOBIN: 9.2 g/dL — AB (ref 12.0–15.0)
MCH: 26.5 pg (ref 26.0–34.0)
MCHC: 29.9 g/dL — ABNORMAL LOW (ref 30.0–36.0)
MCV: 88.8 fL (ref 78.0–100.0)
Platelets: 147 10*3/uL — ABNORMAL LOW (ref 150–400)
RBC: 3.47 MIL/uL — AB (ref 3.87–5.11)
RDW: 15.1 % (ref 11.5–15.5)
WBC: 6.8 10*3/uL (ref 4.0–10.5)

## 2013-08-08 LAB — CULTURE, BLOOD (ROUTINE X 2)
CULTURE: NO GROWTH
Culture: NO GROWTH
Culture: NO GROWTH

## 2013-08-08 MED ORDER — POTASSIUM CHLORIDE CRYS ER 20 MEQ PO TBCR: 40.0000 meq | EXTENDED_RELEASE_TABLET | Freq: Once | ORAL | Status: DC

## 2013-08-08 NOTE — Progress Notes (Signed)
[facility] Progress Note Patient Name: Laura [name] DOB: 1930/02/24 MRN: [id number]  Date of Service  08/08/2013   HPI/Events of Note  Hypokalemia   eICU Interventions  Potassium replaced   Intervention Category Minor Interventions: Electrolytes abnormality - evaluation and management  Guadelupe Sabin Deterding 08/08/2013, 4:54 AM

## 2013-08-08 NOTE — Progress Notes (Signed)
PULMONARY / CRITICAL CARE MEDICINE   Name: Laura [name] MRN: [id number] DOB: [birth year]    ADMISSION DATE:  07/31/2013 CONSULTATION DATE:  08/05/13  REFERRING MD : [facility]  PRIMARY SERVICE: PCCM   CHIEF COMPLAINT: Acute respiratory failure   BRIEF PATIENT DESCRIPTION: 80 yo with diastolic CHF, OSA, asthma (?) admitted 4/24 with dyspnea and syncope. Patient was somnolent, chest x-ray and physical exam findings were consistent with CHF. ProBNP elevated 7176. Trop negative X 1. PCCM was asked to consult given worsening respiratory status.   SIGNIFICANT EVENTS / STUDIES:  4/24 Admitted for CHF and respiratory failure  4/25 Intubated  4/25 Echo >>> EF 50% to 55%. grade 1 diastolic dysfunction. RV dilated. PA peak pressure: 37mm Hg (S).  4/26 Hypotensive due to diuresis and antihypertensive meds. CVP 3. Received NS bolus  4/26 Extubation  4/28 nCPAP started  LINES / TUBES:  ETT 4/25 >>> 4/26  R IJ CVP 4/25 >>>  OGT 4/25 >>> 4/26  R radial A line 4/25 >>> 4/27   CULTURES:  4/25 Blood >>>  4/25 Respiratory >>> Neg  4/26 Blood >>>  4/26 Urine >>> ESCHERICHIA COLI pansensitive 4/26 Respiratory >>> Neg  ANTIBIOTICS:  Ciprofloxacin 4/27 >>>4/28 Keflex 4/29>>>4/30 Rocephin 4/30>>>  INTERVAL HISTORY:   , improved with bipap.  Increased hypercarbia. Now back in SDU.   VITAL SIGNS: Temp:  [97.7 F (36.5 C)-98.7 F (37.1 C)] 98 F (36.7 C) (05/02 1218) Pulse Rate:  [51-71] 59 (05/02 1218) Resp:  [17-30] 23 (05/02 1218) BP: (90-165)/(38-80) 90/38 mmHg (05/02 1218) SpO2:  [94 %-100 %] 96 % (05/02 1218) FiO2 (%):  [30 %] 30 % (05/02 0426)  Vent Mode:  [-]  FiO2 (%):  [30 %] 30 %  INTAKE / OUTPUT: Intake/Output     05/01 0701 - 05/02 0700 05/02 0701 - 05/03 0700   P.O. 420    I.V. (mL/kg) 6 (0.1)    IV Piggyback 100    Total Intake(mL/kg) 526 (6)    Urine (mL/kg/hr) 2375 (1.1)    Total Output 2375     Net -1849          Urine Occurrence 2 x      PHYSICAL  EXAMINATION: General: chronic ill appearing.  Neuro:   Alert, sitting on side of bed HEENT:  no JVD or carotid bruit  Cardiovascular: Regular rhythm, no Murmur  Lungs: resps even non labored on bipap. diminished. Bibasilar rales noted.  Abdomen: soft, distended, no guarding, Bowel sound present  Musculoskeletal: No leg edema  Skin: no rashes   LABS:  CBC  Recent Labs Lab 08/06/13 0455 08/07/13 0355 08/08/13 0346  WBC 7.0 7.5 6.8  HGB 9.2* 9.5* 9.2*  HCT 32.1* 32.1* 30.8*  PLT 136* 147* 147*   Coag's  Recent Labs Lab 08/01/13 1936 08/02/13 0835  APTT 167* 39*  INR 1.15 1.09   BMET  Recent Labs Lab 08/07/13 0355 08/07/13 1937 08/08/13 0346  NA 145 143 144  K 3.8 3.9 3.2*  CL 100 98 100  CO2 33* 32 33*  BUN 22 25* 25*  CREATININE 0.94 1.01 0.94  GLUCOSE 101* 202* 96   Electrolytes  Recent Labs Lab 08/05/13 1956 08/06/13 0455  08/07/13 0355 08/07/13 1937 08/08/13 0346  CALCIUM 9.3 9.2  < > 9.1 9.2 9.1  MG 2.3 2.4  --  2.2  --   --   PHOS 4.6 4.3  --  3.9  --   --   < > =  values in this interval not displayed. Sepsis Markers  Recent Labs Lab 08/02/13 0830 08/02/13 0835 08/03/13 0420 08/04/13 0400  LATICACIDVEN 0.9  --   --   --   PROCALCITON  --  0.63 0.40 0.27   ABG  Recent Labs Lab 08/06/13 0356 08/07/13 0425 08/08/13 0420  PHART 7.253* 7.341* 7.398  PCO2ART 81.0* 67.5* 57.2*  PO2ART 104.0* 91.8 86.3   Liver Enzymes No results found for this basename: AST, ALT, ALKPHOS, BILITOT, ALBUMIN,  in the last 168 hours Cardiac Enzymes  Recent Labs Lab 08/07/13 0355  PROBNP 3897.0*   Glucose  Recent Labs Lab 08/02/13 0356 08/02/13 0737 08/02/13 1129 08/02/13 1601 08/02/13 1933 08/04/13 0747  GLUCAP 126* 122* 97 119* 106* 96   IMAGING:  Dg Chest Port 1 View  08/07/2013   CLINICAL DATA:  Respiratory failure  EXAM: PORTABLE CHEST - 1 VIEW  COMPARISON:  August 06, 2013  FINDINGS: There is underlying emphysema. There is  generalized cardiomegaly, stable. There is consolidation in both lung bases. There are questionable minimal effusions bilaterally. Elsewhere, lungs are clear. No adenopathy. No pneumothorax.  IMPRESSION: Cardiomegaly with questionable minimal effusions bilaterally. There is bilateral consolidation in the bases. Underlying emphysema. There is very little change from 1 day prior. Question a degree of congestive heart failure superimposed on emphysematous change. Pneumonia in the bases cannot be excluded on this study.   Electronically Signed   By: Lowella Grip M.D.   On: 08/07/2013 08:01   ASSESSMENT / PLAN:  PULMONARY  A:  Acute on chronic respiratory failure, improving Pulmonary edema, improving Pneumonia unlikely  OSA / OHS, tolerated nCPAP well with no hypercarbia in the morning Suspect undiagnosed COPD without exacerbation  DNI status  P:  Supplemental O2 - goals sats 88-92%  BiPAP  Albuterol PRN  F/u CXR  Polysomnography as outpatient, needs to be set up with [facility] Would d/c on nocturnal positive pressure therapy ( hypercarbia should qualify for DME without sleep study )  CARDIOVASCULAR  A:  Acute on chronic diastolic heart failure  Transient hypotension, resolved with fluids  HTN  CAD  AF  Thoracic aortic aneurysm (4.3 cm - unchanged, Dr. Sherren Mocha Early )  Possible pulmonary hypertension secondary to chronic heart / lung disease  DNR status  Lab Results  Component Value Date   PROBNP 3897.0* 08/07/2013    P:  ASA, Metoprolol, Lipitor  Imdur 30 mg QD Cards following   Will need to change meds back to IV if mental status worsens   RENAL  A:  UTI with E.COLI  Hx of ureteral stent in 2014  Hypernatremia  Contraction alkalosis, improving Stay negative  P:  Trend BMP, Mg, Phos  See ID  Hold further lasix    GASTROINTESTINAL  A:  GI Px is not indicated  Nutrition  P:  HH diet   HEMATOLOGIC  A:  Anemia  Thrombocytopenia, resolved VTE Px  Lab  Results  Component Value Date   PLT 147* 08/08/2013   PLT 147* 08/07/2013   PLT 136* 08/06/2013    P:  Follow CBC  SCD  Heparin SQ   INFECTIOUS  A:  UTI with E.COLI  P:  Per  Micro dashboard above  ENDOCRINE  A:  Subclinical hypothyroidism  P:  Defer Levothyroxine  Outpatient follow up   NEUROLOGIC  A:  Acute encephalopathy, improving  Hx of depression  P:    Christinia Gully, MD Pulmonary and [place] [phone] After  5:30 PM or weekends, call [phone]

## 2013-08-08 NOTE — Progress Notes (Signed)
Subjective:  Breathing better but still coughing.  Objective:   Vital Signs in the last 24 hours: Temp:  [97.7 F (36.5 C)-98.7 F (37.1 C)] 98 F (36.7 C) (05/02 1218) Pulse Rate:  [51-71] 59 (05/02 1218) Resp:  [17-30] 23 (05/02 1218) BP: (90-165)/(38-80) 90/38 mmHg (05/02 1218) SpO2:  [94 %-100 %] 96 % (05/02 1218) FiO2 (%):  [30 %] 30 % (05/02 0426)  Intake/Output from previous day: 05/01 0701 - 05/02 0700 In: 526 [P.O.:420; I.V.:6; IV Piggyback:100] Out: 2375 [Urine:2375]  Medications: . amLODipine  5 mg Oral Daily  . antiseptic oral rinse  15 mL Mouth Rinse BID  . atorvastatin  20 mg Oral q1800  . cefTRIAXone (ROCEPHIN)  IV  1 g Intravenous Q24H  . fluticasone  2 puff Inhalation BID  . furosemide  40 mg Oral BID  . heparin subcutaneous  5,000 Units Subcutaneous 3 times per day  . isosorbide mononitrate  30 mg Oral Daily  . metoprolol tartrate  25 mg Oral BID  . montelukast  10 mg Oral Daily  . potassium chloride  10 mEq Oral Daily  . potassium chloride  40 mEq Oral Once  . sertraline  50 mg Oral Q breakfast  . sodium chloride  3 mL Intravenous Q12H  . sodium chloride  3 mL Intravenous Q12H    . sodium chloride Stopped (08/03/13 1330)    Physical Exam:   General appearance: alert, cooperative and no distress Neck: no adenopathy, no JVD, supple, symmetrical, trachea midline and thyroid not enlarged, symmetric, no tenderness/mass/nodules Lungs: decreased BS without wheezing Heart: regular rate and rhythm and 1/6 sem Abdomen: soft, non-tender; bowel sounds normal; no masses,  no organomegaly Extremities: venous stasis dermatitis noted Skin: Skin color, texture, turgor normal. No rashes or lesions Neurologic: Grossly normal   Rate: 65  Rhythm: normal sinus rhythm  Lab Results:   Recent Labs  08/07/13 1937 08/08/13 0346  NA 143 144  K 3.9 3.2*  CL 98 100  CO2 32 33*  GLUCOSE 202* 96  BUN 25* 25*  CREATININE 1.01 0.94   CBC    Component  Value Date/Time   WBC 6.8 08/08/2013 0346   RBC 3.47* 08/08/2013 0346   RBC 3.58* 08/01/2013 0850   HGB 9.2* 08/08/2013 0346   HCT 30.8* 08/08/2013 0346   PLT 147* 08/08/2013 0346   MCV 88.8 08/08/2013 0346   MCH 26.5 08/08/2013 0346   MCHC 29.9* 08/08/2013 0346   RDW 15.1 08/08/2013 0346   LYMPHSABS 0.7 08/02/2013 0300   MONOABS 0.7 08/02/2013 0300   EOSABS 0.1 08/02/2013 0300   BASOSABS 0.0 08/02/2013 0300      No results found for this basename: TROPONINI, CK, MB,  in the last 72 hours  Hepatic Function Panel No results found for this basename: PROT, ALBUMIN, AST, ALT, ALKPHOS, BILITOT, BILIDIR, IBILI,  in the last 72 hours No results found for this basename: INR,  in the last 72 hours BNP (last 3 results)  Recent Labs  07/31/13 1731 08/01/13 0952 08/07/13 0355  PROBNP 7176.0* 12114.0* 3897.0*    Lipid Panel  No results found for this basename: chol, trig, hdl, cholhdl, vldl, ldlcalc      Imaging:  Dg Chest Port 1 View  08/07/2013   CLINICAL DATA:  Respiratory failure  EXAM: PORTABLE CHEST - 1 VIEW  COMPARISON:  August 06, 2013  FINDINGS: There is underlying emphysema. There is generalized cardiomegaly, stable. There is consolidation in both lung bases. There are  questionable minimal effusions bilaterally. Elsewhere, lungs are clear. No adenopathy. No pneumothorax.  IMPRESSION: Cardiomegaly with questionable minimal effusions bilaterally. There is bilateral consolidation in the bases. Underlying emphysema. There is very little change from 1 day prior. Question a degree of congestive heart failure superimposed on emphysematous change. Pneumonia in the bases cannot be excluded on this study.   Electronically Signed   By: Lowella Grip M.D.   On: 08/07/2013 08:01      Assessment/Plan:   Active Problems:   Acute exacerbation of CHF (congestive heart failure)   Chronic diastolic heart failure   Acute and chronic respiratory failure   I/O -1849 yesterday; -7251 since admission. Weight  decreased 9 lbs since admission. No chest pain. Contraction alkalosis resolved. BNP still elevated but improving. Continue oral diuretic. On IV Rocephin. K replete.   Troy Sine, MD, [facility] 08/08/2013, 3:12 PM

## 2013-08-09 DIAGNOSIS — E669 Obesity, unspecified: Secondary | ICD-10-CM

## 2013-08-09 DIAGNOSIS — J45909 Unspecified asthma, uncomplicated: Secondary | ICD-10-CM

## 2013-08-09 LAB — BASIC METABOLIC PANEL
BUN: 25 mg/dL — ABNORMAL HIGH (ref 6–23)
BUN: 25 mg/dL — ABNORMAL HIGH (ref 6–23)
CALCIUM: 9.1 mg/dL (ref 8.4–10.5)
CHLORIDE: 96 meq/L (ref 96–112)
CO2: 32 mEq/L (ref 19–32)
CO2: 32 meq/L (ref 19–32)
CREATININE: 0.92 mg/dL (ref 0.50–1.10)
Calcium: 9.3 mg/dL (ref 8.4–10.5)
Chloride: 97 mEq/L (ref 96–112)
Creatinine, Ser: 0.98 mg/dL (ref 0.50–1.10)
GFR calc Af Amer: 61 mL/min — ABNORMAL LOW (ref >90)
GFR calc non Af Amer: 57 mL/min — ABNORMAL LOW (ref >90)
GFR calc non Af Amer: 53 mL/min — ABNORMAL LOW (ref >90)
GFR, EST AFRICAN AMERICAN: 66 mL/min — AB (ref >90)
Glucose, Bld: 102 mg/dL — ABNORMAL HIGH (ref 70–99)
Glucose, Bld: 133 mg/dL — ABNORMAL HIGH (ref 70–99)
POTASSIUM: 3.4 meq/L — AB (ref 3.7–5.3)
POTASSIUM: 4 meq/L (ref 3.7–5.3)
SODIUM: 142 meq/L (ref 137–147)
SODIUM: 142 meq/L (ref 137–147)

## 2013-08-09 LAB — BLOOD GAS, ARTERIAL
Acid-Base Excess: 8.4 mmol/L — ABNORMAL HIGH (ref 0.0–2.0)
Bicarbonate: 33.1 mEq/L — ABNORMAL HIGH (ref 20.0–24.0)
Drawn by: 405301
O2 Content: 0.5 L/min
O2 Saturation: 94.6 %
PATIENT TEMPERATURE: 97.4
PCO2 ART: 51.3 mmHg — AB (ref 35.0–45.0)
PH ART: 7.423 (ref 7.350–7.450)
PO2 ART: 74 mmHg — AB (ref 80.0–100.0)
TCO2: 34.8 mmol/L (ref 0–100)

## 2013-08-09 LAB — CBC
HCT: 32 % — ABNORMAL LOW (ref 36.0–46.0)
HEMOGLOBIN: 9.8 g/dL — AB (ref 12.0–15.0)
MCH: 27.2 pg (ref 26.0–34.0)
MCHC: 30.6 g/dL (ref 30.0–36.0)
MCV: 88.9 fL (ref 78.0–100.0)
Platelets: 156 10*3/uL (ref 150–400)
RBC: 3.6 MIL/uL — ABNORMAL LOW (ref 3.87–5.11)
RDW: 15.2 % (ref 11.5–15.5)
WBC: 7.5 10*3/uL (ref 4.0–10.5)

## 2013-08-09 MED ORDER — GUAIFENESIN 100 MG/5ML PO SYRP
100.0000 mg | ORAL_SOLUTION | Freq: Three times a day (TID) | ORAL | Status: DC | PRN
  Administered 2013-08-09: 100 mg via ORAL
  Filled 2013-08-09: qty 5

## 2013-08-09 MED ORDER — PANTOPRAZOLE SODIUM 40 MG PO TBEC
40.0000 mg | DELAYED_RELEASE_TABLET | Freq: Two times a day (BID) | ORAL | Status: DC
  Administered 2013-08-09 – 2013-08-11 (×4): 40 mg via ORAL
  Filled 2013-08-09 (×3): qty 1

## 2013-08-09 MED ORDER — POTASSIUM CHLORIDE CRYS ER 20 MEQ PO TBCR
40.0000 meq | EXTENDED_RELEASE_TABLET | Freq: Once | ORAL | Status: AC
  Administered 2013-08-09: 40 meq via ORAL
  Filled 2013-08-09: qty 2

## 2013-08-09 NOTE — Progress Notes (Signed)
Subjective:  Breathing better ; had a significantly coughing spell last night. Better now.  Objective:   Vital Signs in the last 24 hours: Temp:  [97.4 F (36.3 C)-98.6 F (37 C)] 98 F (36.7 C) (05/03 1231) Pulse Rate:  [53-68] 58 (05/03 1231) Resp:  [16-23] 22 (05/03 1231) BP: (109-166)/(54-91) 125/54 mmHg (05/03 1231) SpO2:  [92 %-100 %] 92 % (05/03 1231) FiO2 (%):  [30 %] 30 % (05/02 2347)  Intake/Output from previous day: 05/02 0701 - 05/03 0700 In: 50 [IV Piggyback:50] Out: -   Medications: . amLODipine  5 mg Oral Daily  . antiseptic oral rinse  15 mL Mouth Rinse BID  . atorvastatin  20 mg Oral q1800  . fluticasone  2 puff Inhalation BID  . furosemide  40 mg Oral BID  . heparin subcutaneous  5,000 Units Subcutaneous 3 times per day  . isosorbide mononitrate  30 mg Oral Daily  . metoprolol tartrate  25 mg Oral BID  . montelukast  10 mg Oral Daily  . potassium chloride  10 mEq Oral Daily  . potassium chloride  40 mEq Oral Once  . sertraline  50 mg Oral Q breakfast  . sodium chloride  3 mL Intravenous Q12H  . sodium chloride  3 mL Intravenous Q12H    . sodium chloride Stopped (08/03/13 1330)    Physical Exam:   General appearance: alert, cooperative and no distress Neck: no adenopathy, no JVD, supple, symmetrical, trachea midline and thyroid not enlarged, symmetric, no tenderness/mass/nodules Lungs: decreased BS without wheezing; better than yesterday Heart: regular rate and rhythm and 1/6 sem Abdomen: soft, non-tender; bowel sounds normal; no masses,  no organomegaly Extremities: venous stasis dermatitis noted; no edema Skin: Skin color, texture, turgor normal. No rashes or lesions Neurologic: Grossly normal   Rate: 60  Rhythm: normal sinus rhythm  Lab Results:   Recent Labs  08/08/13 1815 08/09/13 0406  NA 140 142  K 4.1 3.4*  CL 96 96  CO2 32 32  GLUCOSE 157* 102*  BUN 26* 25*  CREATININE 0.96 0.92   CBC    Component Value Date/Time   WBC 7.5 08/09/2013 0406   RBC 3.60* 08/09/2013 0406   RBC 3.58* 08/01/2013 0850   HGB 9.8* 08/09/2013 0406   HCT 32.0* 08/09/2013 0406   PLT 156 08/09/2013 0406   MCV 88.9 08/09/2013 0406   MCH 27.2 08/09/2013 0406   MCHC 30.6 08/09/2013 0406   RDW 15.2 08/09/2013 0406   LYMPHSABS 0.7 08/02/2013 0300   MONOABS 0.7 08/02/2013 0300   EOSABS 0.1 08/02/2013 0300   BASOSABS 0.0 08/02/2013 0300      No results found for this basename: TROPONINI, CK, MB,  in the last 72 hours  Hepatic Function Panel No results found for this basename: PROT, ALBUMIN, AST, ALT, ALKPHOS, BILITOT, BILIDIR, IBILI,  in the last 72 hours No results found for this basename: INR,  in the last 72 hours BNP (last 3 results)  Recent Labs  07/31/13 1731 08/01/13 0952 08/07/13 0355  PROBNP 7176.0* 12114.0* 3897.0*    Lipid Panel  No results found for this basename: chol,  trig,  hdl,  cholhdl,  vldl,  ldlcalc      Imaging:  No results found.    Assessment/Plan:   Active Problems:   Acute exacerbation of CHF (congestive heart failure)   Chronic diastolic heart failure   Acute and chronic respiratory failure   I/O +50  yesterday; - 225 so far today  and -7426  since admission. K today 3.4; CO2 32. K replete; Mg 2.2 on 5/1.  Resolved contraction alkalosis. No chest pain.  Troy Sine, MD, [facility] 08/09/2013, 2:06 PM

## 2013-08-09 NOTE — Progress Notes (Signed)
PULMONARY / CRITICAL CARE MEDICINE   Name: Laura [name] MRN: [id number] DOB: [birth year]    ADMISSION DATE:  07/31/2013 CONSULTATION DATE:  08/05/13  REFERRING MD : [facility]  PRIMARY SERVICE: PCCM   CHIEF COMPLAINT: Acute respiratory failure   BRIEF PATIENT DESCRIPTION: 80 yo with diastolic CHF, OSA, asthma (?) admitted 4/24 with dyspnea and syncope. Patient was somnolent, chest x-ray and physical exam findings were consistent with CHF. ProBNP elevated 7176. Trop negative X 1. PCCM was asked to consult given worsening respiratory status.   SIGNIFICANT EVENTS / STUDIES:  4/24 Admitted for CHF and respiratory failure  4/25 Intubated  4/25 Echo >>> EF 50% to 55%. grade 1 diastolic dysfunction. RV dilated. PA peak pressure: 34mm Hg (S).  4/26 Hypotensive due to diuresis and antihypertensive meds. CVP 3. Received NS bolus  4/26 Extubation  4/28 nCPAP started  LINES / TUBES:  ETT 4/25 >>> 4/26  R IJ CVP 4/25 >>>  OGT 4/25 >>> 4/26  R radial A line 4/25 >>> 4/27   CULTURES:  4/25 Blood x2  > neg 4/25 Respiratory > neg  4/26 Blood > neg  4/26 Urine >>> ESCHERICHIA COLI pansensitive 4/26 Respiratory >>> Neg  ANTIBIOTICS:  Ciprofloxacin 4/27 >>>4/28 Keflex 4/29>>>4/30 Rocephin 4/30>>> 5/3  INTERVAL HISTORY:   coughing fit pm 5/2 .   VITAL SIGNS: Temp:  [97.4 F (36.3 C)-98.6 F (37 C)] 98 F (36.7 C) (05/03 1231) Pulse Rate:  [53-68] 58 (05/03 1231) Resp:  [16-23] 22 (05/03 1231) BP: (109-166)/(54-91) 125/54 mmHg (05/03 1231) SpO2:  [92 %-100 %] 92 % (05/03 1231) FiO2 (%):  [30 %] 30 % (05/02 2347)  Vent Mode:  [-]  FiO2 (%):  [30 %] 30 %  INTAKE / OUTPUT: Intake/Output     05/02 0701 - 05/03 0700 05/03 0701 - 05/04 0700   P.O.  350   I.V. (mL/kg)     IV Piggyback 50    Total Intake(mL/kg) 50 (0.6) 350 (4)   Urine (mL/kg/hr)  575 (0.7)   Total Output   575   Net +50 -225        Urine Occurrence 3 x 1 x   Stool Occurrence 1 x 1 x   Emesis Occurrence 1 x       PHYSICAL EXAMINATION: General: chronic ill appearing.  Neuro:   Alert, sitting on side of bed HEENT:  no JVD or carotid bruit  Cardiovascular: Regular rhythm, no Murmur  Lungs: resps even non labored on bipap. diminished. Bibasilar rales noted.  Abdomen: soft, distended, no guarding, Bowel sound present  Musculoskeletal: No leg edema  Skin: no rashes   LABS:  CBC  Recent Labs Lab 08/07/13 0355 08/08/13 0346 08/09/13 0406  WBC 7.5 6.8 7.5  HGB 9.5* 9.2* 9.8*  HCT 32.1* 30.8* 32.0*  PLT 147* 147* 156   Coag's No results found for this basename: APTT, INR,  in the last 168 hours BMET  Recent Labs Lab 08/08/13 0346 08/08/13 1815 08/09/13 0406  NA 144 140 142  K 3.2* 4.1 3.4*  CL 100 96 96  CO2 33* 32 32  BUN 25* 26* 25*  CREATININE 0.94 0.96 0.92  GLUCOSE 96 157* 102*   Electrolytes  Recent Labs Lab 08/05/13 1956 08/06/13 0455  08/07/13 0355  08/08/13 0346 08/08/13 1815 08/09/13 0406  CALCIUM 9.3 9.2  < > 9.1  < > 9.1 9.3 9.3  MG 2.3 2.4  --  2.2  --   --   --   --  PHOS 4.6 4.3  --  3.9  --   --   --   --   < > = values in this interval not displayed. Sepsis Markers  Recent Labs Lab 08/03/13 0420 08/04/13 0400  PROCALCITON 0.40 0.27   ABG  Recent Labs Lab 08/07/13 0425 08/08/13 0420 08/09/13 0442  PHART 7.341* 7.398 7.423  PCO2ART 67.5* 57.2* 51.3*  PO2ART 91.8 86.3 74.0*   Liver Enzymes No results found for this basename: AST, ALT, ALKPHOS, BILITOT, ALBUMIN,  in the last 168 hours Cardiac Enzymes  Recent Labs Lab 08/07/13 0355  PROBNP 3897.0*   Glucose  Recent Labs Lab 08/02/13 1933 08/04/13 0747  GLUCAP 106* 96   IMAGING:  No results found. ASSESSMENT / PLAN:  PULMONARY  A:  Acute on chronic respiratory failure, improving Pulmonary edema, improving Pneumonia unlikely  OSA / OHS, tolerated nCPAP well   Suspect undiagnosed COPD without exacerbation Severe coughing fits ? Flovent/ gerd  DNI status  P:   Supplemental O2 - goals sats 88-92%  BiPAP  Albuterol PRN  F/u CXR  Polysomnography as outpatient, needs to be set up with [facility] Would d/c on nocturnal positive pressure therapy ( hypercarbia should qualify for DME without sleep study ) D/c flovent 5/3 due to cough / add gerd rx   CARDIOVASCULAR  A:  Acute on chronic diastolic heart failure  Transient hypotension, resolved with fluids  HTN  CAD  AF  Thoracic aortic aneurysm (4.3 cm - unchanged, Dr. Sherren Mocha Early )  Possible pulmonary hypertension secondary to chronic heart / lung disease  DNR status  Lab Results  Component Value Date   PROBNP 3897.0* 08/07/2013    P:  ASA, Metoprolol, Lipitor  Imdur 30 mg QD Cards following   Will need to change meds back to IV if mental status worsens   RENAL  A:  UTI with E.COLI  Hx of ureteral stent in 2014  Hypernatremia  Contraction alkalosis, improving Stay negative  P:    No change rx      GASTROINTESTINAL  A:  GI Px is not indicated  Nutrition  P:  HH diet   HEMATOLOGIC  A:  Anemia  Thrombocytopenia, resolved VTE Px  Lab Results  Component Value Date   PLT 156 08/09/2013   PLT 147* 08/08/2013   PLT 147* 08/07/2013    P:  Follow CBC  SCD  Heparin SQ   INFECTIOUS  A:  UTI with E.COLI  P:  Per  Micro dashboard above  ENDOCRINE  A:  Subclinical hypothyroidism  P:  Defer Levothyroxine  Outpatient follow up   NEUROLOGIC  A:  Acute encephalopathy, improving  Hx of depression      Christinia Gully, MD [facility] [place] [phone] After 5:30 PM or weekends, call [phone]

## 2013-08-09 NOTE — Progress Notes (Signed)
Patient declined to wear CPAP at this time.  Said she may not wear it tonight.  SPO2 on room air 97%, RR 20. Patient is aware to have RN call RT at anytime if she changes her mind.  RT will continue to monitor.

## 2013-08-10 ENCOUNTER — Inpatient Hospital Stay (HOSPITAL_COMMUNITY): Payer: Medicare Other

## 2013-08-10 LAB — PULMONARY FUNCTION TEST
FEF 25-75 Pre: 0.51 L/sec
FEF2575-%Pred-Pre: 36 %
FEV1-%Pred-Pre: 40 %
FEV1-PRE: 0.8 L
FEV1FVC-%Pred-Pre: 95 %
FEV6-%PRED-PRE: 44 %
FEV6-PRE: 1.13 L
FEV6FVC-%PRED-PRE: 106 %
FVC-%PRED-PRE: 42 %
FVC-Pre: 1.13 L
Pre FEV1/FVC ratio: 71 %
Pre FEV6/FVC Ratio: 100 %

## 2013-08-10 LAB — URINALYSIS, ROUTINE W REFLEX MICROSCOPIC
Bilirubin Urine: NEGATIVE
GLUCOSE, UA: NEGATIVE mg/dL
Ketones, ur: NEGATIVE mg/dL
Nitrite: NEGATIVE
Protein, ur: NEGATIVE mg/dL
Specific Gravity, Urine: 1.016 (ref 1.005–1.030)
Urobilinogen, UA: 0.2 mg/dL (ref 0.0–1.0)
pH: 6 (ref 5.0–8.0)

## 2013-08-10 LAB — CBC
HCT: 33.5 % — ABNORMAL LOW (ref 36.0–46.0)
Hemoglobin: 10.3 g/dL — ABNORMAL LOW (ref 12.0–15.0)
MCH: 27.3 pg (ref 26.0–34.0)
MCHC: 30.7 g/dL (ref 30.0–36.0)
MCV: 88.9 fL (ref 78.0–100.0)
Platelets: 166 10*3/uL (ref 150–400)
RBC: 3.77 MIL/uL — AB (ref 3.87–5.11)
RDW: 15.2 % (ref 11.5–15.5)
WBC: 7 10*3/uL (ref 4.0–10.5)

## 2013-08-10 LAB — BASIC METABOLIC PANEL
BUN: 23 mg/dL (ref 6–23)
BUN: 25 mg/dL — ABNORMAL HIGH (ref 6–23)
CHLORIDE: 96 meq/L (ref 96–112)
CO2: 34 meq/L — AB (ref 19–32)
CO2: 33 meq/L — AB (ref 19–32)
CREATININE: 0.97 mg/dL (ref 0.50–1.10)
Calcium: 9.6 mg/dL (ref 8.4–10.5)
Calcium: 9.9 mg/dL (ref 8.4–10.5)
Chloride: 97 mEq/L (ref 96–112)
Creatinine, Ser: 1.09 mg/dL (ref 0.50–1.10)
GFR calc Af Amer: 62 mL/min — ABNORMAL LOW (ref >90)
GFR calc Af Amer: 54 mL/min — ABNORMAL LOW (ref >90)
GFR calc non Af Amer: 53 mL/min — ABNORMAL LOW (ref >90)
GFR calc non Af Amer: 46 mL/min — ABNORMAL LOW (ref >90)
GLUCOSE: 173 mg/dL — AB (ref 70–99)
Glucose, Bld: 105 mg/dL — ABNORMAL HIGH (ref 70–99)
POTASSIUM: 5 meq/L (ref 3.7–5.3)
Potassium: 3.9 mEq/L (ref 3.7–5.3)
SODIUM: 141 meq/L (ref 137–147)
SODIUM: 142 meq/L (ref 137–147)

## 2013-08-10 LAB — URINE MICROSCOPIC-ADD ON

## 2013-08-10 MED ORDER — FUROSEMIDE 40 MG PO TABS
40.0000 mg | ORAL_TABLET | Freq: Three times a day (TID) | ORAL | Status: DC
  Administered 2013-08-10 – 2013-08-11 (×3): 40 mg via ORAL
  Filled 2013-08-10 (×5): qty 1

## 2013-08-10 MED ORDER — ENSURE COMPLETE PO LIQD
237.0000 mL | Freq: Every day | ORAL | Status: DC
  Administered 2013-08-10 – 2013-08-11 (×2): 237 mL via ORAL

## 2013-08-10 NOTE — Progress Notes (Signed)
NUTRITION FOLLOW UP  Intervention:    Ensure Complete daily, each supplement provides 350 kcals, 13 gm protein per 8 fl oz bottle RD to follow for nutrition care plan  Nutrition Dx:   Inadequate oral intake, improved  Monitor:   PO & supplemental intake, weight, labs, I/O's  Assessment:   PMHx significant for CHF, HTN, dyslipidemia. Admitted with worsening SOB x several days. Work-up reveals acute hypoxic respiratory failure and CHF exacerbation.  Patient extubated 4/26.  Transferred to 2C-Stepdown 4/30.  Patient advanced to a Heart Healthy diet 4/27.  PO intake variable at 50-75% per flowsheet records.  Acute encephalopathy and pulmonary edema improving.  Pt tolerated nasal BiPAP last night.  Would benefit from addition of nutrition supplements.  Disposition: SNF.  Height: Ht Readings from Last 1 Encounters:  08/06/13 5\' 5"  (1.651 m)    Weight Status:   Wt Readings from Last 1 Encounters:  08/07/13 194 lb 7.1 oz (88.2 kg)    Re-estimated needs:  Kcal: 1790-1920 Protein: 80-95g Fluid: ~1.8 L/day  Skin: non-pitting edema  Diet Order: Cardiac   Intake/Output Summary (Last 24 hours) at 08/10/13 1345 Last data filed at 08/10/13 1100  Gross per 24 hour  Intake      0 ml  Output    150 ml  Net   -150 ml    Labs:   Recent Labs Lab 08/05/13 1956 08/06/13 0455  08/07/13 0355  08/09/13 0406 08/09/13 1650 08/10/13 0335  NA 146 145  < > 145  < > 142 142 141  K 4.1 4.1  < > 3.8  < > 3.4* 4.0 3.9  CL 103 104  < > 100  < > 96 97 96  CO2 33* 31  < > 33*  < > 32 32 34*  BUN 20 18  < > 22  < > 25* 25* 23  CREATININE 0.82 0.80  < > 0.94  < > 0.92 0.98 0.97  CALCIUM 9.3 9.2  < > 9.1  < > 9.3 9.1 9.6  MG 2.3 2.4  --  2.2  --   --   --   --   PHOS 4.6 4.3  --  3.9  --   --   --   --   GLUCOSE 174* 110*  < > 101*  < > 102* 133* 105*  < > = values in this interval not displayed.   Scheduled Meds: . amLODipine  5 mg Oral Daily  . antiseptic oral rinse  15 mL Mouth  Rinse BID  . atorvastatin  20 mg Oral q1800  . furosemide  40 mg Oral TID  . heparin subcutaneous  5,000 Units Subcutaneous 3 times per day  . isosorbide mononitrate  30 mg Oral Daily  . metoprolol tartrate  25 mg Oral BID  . montelukast  10 mg Oral Daily  . pantoprazole  40 mg Oral BID AC  . potassium chloride  10 mEq Oral Daily  . potassium chloride  40 mEq Oral Once  . sertraline  50 mg Oral Q breakfast  . sodium chloride  3 mL Intravenous Q12H  . sodium chloride  3 mL Intravenous Q12H    Continuous Infusions: . sodium chloride Stopped (08/03/13 1330)    Arthur Holms, RD, LDN Pager #: [phone] After-Hours Pager #: [phone]

## 2013-08-10 NOTE — Progress Notes (Addendum)
PULMONARY / CRITICAL CARE MEDICINE   Name: Laura [name] MRN: [id number] DOB: 1930/03/21    ADMISSION DATE:  07/31/2013 CONSULTATION DATE:  08/05/13  REFERRING MD : [facility]  PRIMARY SERVICE: PCCM   CHIEF COMPLAINT: Acute respiratory failure   BRIEF PATIENT DESCRIPTION: 80 yo with diastolic CHF, OSA, asthma (?) admitted 4/24 with dyspnea and syncope. Patient was somnolent, chest x-ray and physical exam findings were consistent with CHF. ProBNP elevated 7176. Trop negative X 1. PCCM was asked to consult given worsening respiratory status.   SIGNIFICANT EVENTS / STUDIES:  4/24 Admitted for CHF and respiratory failure  4/25 Intubated  4/25 Echo >>> EF 50% to 55%. grade 1 diastolic dysfunction. RV dilated. PA peak pressure: 67m Hg (S).  4/26 Hypotensive due to diuresis and antihypertensive meds. CVP 3. Received NS bolus  4/26 Extubation  4/28 nCPAP started  LINES / TUBES:  ETT 4/25 >>> 4/26  R IJ CVP 4/25 >>>  OGT 4/25 >>> 4/26  R radial A line 4/25 >>> 4/27   CULTURES:  4/25 Blood x2  > neg 4/25 Respiratory > neg  4/26 Blood > neg  4/26 Urine >>> ESCHERICHIA COLI pansensitive 4/26 Respiratory >>> Neg  ANTIBIOTICS:  Ciprofloxacin 4/27 >>>4/28 Keflex 4/29>>>4/30 Rocephin 4/30>>> 5/3  INTERVAL HISTORY:  Neg 225 cc  VITAL SIGNS: Temp:  [97.9 F (36.6 C)-98.2 F (36.8 C)] 97.9 F (36.6 C) (05/04 1218) Pulse Rate:  [56-70] 60 (05/04 1218) Resp:  [17-23] 20 (05/04 1218) BP: (118-149)/(61-99) 129/61 mmHg (05/04 1218) SpO2:  [95 %-98 %] 95 % (05/04 1218)     INTAKE / OUTPUT: Intake/Output     05/03 0701 - 05/04 0700 05/04 0701 - 05/05 0700   P.O. 350    IV Piggyback     Total Intake(mL/kg) 350 (4)    Urine (mL/kg/hr) 575 (0.3) 150 (0.3)   Total Output 575 150   Net -225 -150        Urine Occurrence 2 x 1 x   Stool Occurrence 1 x      PHYSICAL EXAMINATION: General: chronic ill appearing.  Neuro:   Alert, no changes in exam HEENT:  jvd  Cardiovascular:  Regular rhythm, no Murmur  Lungs: resps reduced  Abdomen: soft, distended, no guarding, Bowel sound present  Musculoskeletal: No leg edema  Skin: no rashes   LABS:  CBC  Recent Labs Lab 08/08/13 0346 08/09/13 0406 08/10/13 0335  WBC 6.8 7.5 7.0  HGB 9.2* 9.8* 10.3*  HCT 30.8* 32.0* 33.5*  PLT 147* 156 166   Coag's No results found for this basename: APTT, INR,  in the last 168 hours BMET  Recent Labs Lab 08/09/13 0406 08/09/13 1650 08/10/13 0335  NA 142 142 141  K 3.4* 4.0 3.9  CL 96 97 96  CO2 32 32 34*  BUN 25* 25* 23  CREATININE 0.92 0.98 0.97  GLUCOSE 102* 133* 105*   Electrolytes  Recent Labs Lab 08/05/13 1956 08/06/13 0455  08/07/13 0355  08/09/13 0406 08/09/13 1650 08/10/13 0335  CALCIUM 9.3 9.2  < > 9.1  < > 9.3 9.1 9.6  MG 2.3 2.4  --  2.2  --   --   --   --   PHOS 4.6 4.3  --  3.9  --   --   --   --   < > = values in this interval not displayed. Sepsis Markers  Recent Labs Lab 08/04/13 0400  PROCALCITON 0.27   ABG  Recent Labs  Lab 08/07/13 0425 08/08/13 0420 08/09/13 0442  PHART 7.341* 7.398 7.423  PCO2ART 67.5* 57.2* 51.3*  PO2ART 91.8 86.3 74.0*   Liver Enzymes No results found for this basename: AST, ALT, ALKPHOS, BILITOT, ALBUMIN,  in the last 168 hours Cardiac Enzymes  Recent Labs Lab 08/07/13 0355  PROBNP 3897.0*   Glucose  Recent Labs Lab 08/04/13 0747  GLUCAP 96   IMAGING:  No results found. ASSESSMENT / PLAN:  PULMONARY  A:  Acute on chronic respiratory failure, improving Pulmonary edema, improving Pneumonia unlikely  OSA / OHS, tolerated nCPAP well   Suspect undiagnosed COPD without exacerbation Severe coughing fits ? Flovent/ gerd  DNI status  P:  Supplemental O2 - goals sats 88-92%  BiPAP not required Albuterol PRN  F/u CXR required, will obtain Polysomnography as outpatient, needs to be set up with [facility] Would d/c on nocturnal positive pressure therapy ( hypercarbia should  qualify for DME without sleep study ) D/c flovent 5/3 due to cough / add gerd rx  consider slight escalation diuresis She is refusing BIPAP  CARDIOVASCULAR  A:  Acute on chronic diastolic heart failure  Transient hypotension, resolved with fluids  HTN  CAD  AF  Thoracic aortic aneurysm (4.3 cm - unchanged, Dr. Sherren Mocha Early )  Possible pulmonary hypertension secondary to chronic heart / lung disease  DNR status  Lab Results  Component Value Date   PROBNP 3897.0* 08/07/2013    P:  ASA, Metoprolol, Lipitor  Imdur 30 mg QD Cards following  Lasix escalate   RENAL  A:  UTI with E.COLI  Hx of ureteral stent in 2014  Hypernatremia resolved Contraction alkalosis, improving Stay negative as goal, bareliy met P:   lasix to tod Chem in am  pcxr now  GASTROINTESTINAL  A:  GI Px is not indicated  Nutrition  P:  HH diet   HEMATOLOGIC  A:  Anemia  Thrombocytopenia, resolved VTE Px  Lab Results  Component Value Date   PLT 166 08/10/2013   PLT 156 08/09/2013   PLT 147* 08/08/2013    P:  Follow CBC  SCD  Heparin SQ   INFECTIOUS  A:  UTI with E.COLI  P:  Per  Micro dashboard above ensure stop date  ENDOCRINE  A:  Subclinical hypothyroidism  P:  Defer Levothyroxine  Outpatient follow up   NEUROLOGIC  A:  Acute encephalopathy, improving  Hx of depression    Lavon Paganini. Titus Mould, MD, [place] Pgr: [facility] [phone]   Update: Extensive d/w family Will try to arrange sleep doc as follow up have called office She tolerated nasal bipap last night, wil continued this and send home with nasal BIPAP And study as outpt  Lavon Paganini. Titus Mould, MD, [place] Pgr: [facility]

## 2013-08-10 NOTE — Progress Notes (Signed)
CSW introduced self to pt and husband and answered questions about SNF. Pt/husband wondering about payment for SNF, and CSW explained pt has days 1-20 with no co-pay. Pt only wants to go to SNF for approx. 1 week. CSW called facility to verify pt had wellness period after last facility-stay. SNF states pt has had wellness period. CSW following and will coordinate discharge to SNF. FL2 on chart for MD signature.   Ky Barban, MSW, [facility] Clinical Social Worker [phone]

## 2013-08-10 NOTE — Progress Notes (Signed)
PT Cancellation Note  Patient Details Name: Laura [name] MRN: [id number] DOB: 27-Dec-1929   Cancelled Treatment:    Reason Eval/Treat Not Completed: Patient unavailable--eating lunch.   Laura [name] Laura [name] 08/10/2013, 5:27 PM Pager [phone]

## 2013-08-11 LAB — CBC
HCT: 38.8 % (ref 36.0–46.0)
HEMOGLOBIN: 11.7 g/dL — AB (ref 12.0–15.0)
MCH: 26.8 pg (ref 26.0–34.0)
MCHC: 30.2 g/dL (ref 30.0–36.0)
MCV: 89 fL (ref 78.0–100.0)
PLATELETS: 196 10*3/uL (ref 150–400)
RBC: 4.36 MIL/uL (ref 3.87–5.11)
RDW: 15.3 % (ref 11.5–15.5)
WBC: 8.2 10*3/uL (ref 4.0–10.5)

## 2013-08-11 LAB — PHOSPHORUS: Phosphorus: 4.3 mg/dL (ref 2.3–4.6)

## 2013-08-11 LAB — BLOOD GAS, ARTERIAL
Acid-Base Excess: 7.6 mmol/L — ABNORMAL HIGH (ref 0.0–2.0)
Bicarbonate: 31.9 mEq/L — ABNORMAL HIGH (ref 20.0–24.0)
DRAWN BY: 39898
FIO2: 0.21 %
O2 Saturation: 93.7 %
PCO2 ART: 47.3 mmHg — AB (ref 35.0–45.0)
PO2 ART: 70.3 mmHg — AB (ref 80.0–100.0)
Patient temperature: 98.6
TCO2: 33.3 mmol/L (ref 0–100)
pH, Arterial: 7.444 (ref 7.350–7.450)

## 2013-08-11 LAB — BASIC METABOLIC PANEL
BUN: 29 mg/dL — ABNORMAL HIGH (ref 6–23)
CO2: 33 mEq/L — ABNORMAL HIGH (ref 19–32)
Calcium: 10.1 mg/dL (ref 8.4–10.5)
Chloride: 91 mEq/L — ABNORMAL LOW (ref 96–112)
Creatinine, Ser: 1.11 mg/dL — ABNORMAL HIGH (ref 0.50–1.10)
GFR calc Af Amer: 53 mL/min — ABNORMAL LOW (ref >90)
GFR, EST NON AFRICAN AMERICAN: 45 mL/min — AB (ref >90)
Glucose, Bld: 111 mg/dL — ABNORMAL HIGH (ref 70–99)
POTASSIUM: 4.1 meq/L (ref 3.7–5.3)
SODIUM: 139 meq/L (ref 137–147)

## 2013-08-11 LAB — MAGNESIUM: MAGNESIUM: 2.5 mg/dL (ref 1.5–2.5)

## 2013-08-11 MED ORDER — AMLODIPINE BESYLATE 5 MG PO TABS: 5.0000 mg | ORAL_TABLET | Freq: Every day | ORAL | Status: AC

## 2013-08-11 MED ORDER — FUROSEMIDE 20 MG PO TABS: 20.0000 mg | ORAL_TABLET | Freq: Every day | ORAL | Status: DC

## 2013-08-11 MED ORDER — METOPROLOL TARTRATE 25 MG PO TABS: 25.0000 mg | ORAL_TABLET | Freq: Two times a day (BID) | ORAL | Status: DC

## 2013-08-11 MED ORDER — FUROSEMIDE 20 MG PO TABS
20.0000 mg | ORAL_TABLET | Freq: Every day | ORAL | Status: DC
  Administered 2013-08-11: 20 mg via ORAL
  Filled 2013-08-11: qty 1

## 2013-08-11 MED ORDER — PANTOPRAZOLE SODIUM 40 MG PO TBEC: 40.0000 mg | DELAYED_RELEASE_TABLET | Freq: Two times a day (BID) | ORAL | Status: DC

## 2013-08-11 MED ORDER — NONFORMULARY OR COMPOUNDED ITEM: Status: DC

## 2013-08-11 NOTE — Progress Notes (Signed)
PT Cancellation Note  Patient Details Name: Laura [name] MRN: [id number] DOB: 1929/05/20   Cancelled Treatment:    Reason Eval/Treat Not Completed: Pt reports she wants to take bath and get "cleaned up" before therapy. Offered to assist her with getting "cleaned up" and she insisted she needed a full bath. Explained several missed attempts at therapy recently and pt remained adamant that she could not do therapy until after her bath. Will attempt to see later today.   Jeanie Cooks Torrion Witter 08/11/2013, 8:42 AM Pager [phone]

## 2013-08-11 NOTE — Progress Notes (Signed)
Patient evaluated for community based chronic disease management services with [facility] as a benefit of patient's [employer]. Spoke with patient at bedside to explain [facility] services.  Patient and her spouse will discuss services prior to engagement.  Left contact information and [facility] literature at bedside. Made Inpatient Case Manager aware that [facility] following. Of note, [facility] Care Management services does not replace or interfere with any services that are arranged by inpatient case management or social work.  For additional questions or referrals please contact Corliss Blacker BSN RN [facility] Liaison at [phone].

## 2013-08-11 NOTE — Discharge Summary (Addendum)
Physician Discharge Summary       Patient ID: Laura [name] MRN: [id number] DOB/AGE: [birth year] 79 y.o.  Admit date: 07/31/2013 Discharge date: 08/12/2013  Discharge Diagnoses:  Acute on chronic respiratory failure in setting of chronic obstructive pulmonary disease and decompensated OSA / OHS  Obstructive sleep apnea Obesity hypoventilation syndrome  Pulmonary edema (resolved) Suspect undiagnosed COPD without exacerbation  Cough -->felt to b exacerbated by GERD and ICS.  DNI status  Acute on chronic diastolic heart failure  Transient hypotension, resolved with fluids  HTN  CAD  AF (resolved) Thoracic aortic aneurysm (4.3 cm - unchanged, Dr. Sherren Mocha Early )  Possible pulmonary hypertension secondary to chronic heart / lung disease  DNR status  Hx of ureteral stent in 2014  Hypernatremia (resolved) Contraction alkalosis, improving  Anemia  Thrombocytopenia, resolved  UTI with E.COLI  Subclinical hypothyroidism  Acute encephalopathy (resolved) Hx of depression    Detailed Hospital Course:   80 year old female with history of CHF (EF 50 to 55% on 05/21/11), CAD/MI, dyslipidemia, hypertension, asthma, kidney stone, who was admitted for SOB and syncope. Patient had been taking lasix at home. In the past several days, she developed progressively worsening shortness of breath. No fever, chest pain. She had to use 4 pillow to sleep in the night due to orthopnea. She also had an sudden onset of syncopal episode and oxygen saturation to 80s. Patient was found to be somnolent in ED. Chest x-ray and physical exam findings are consistent with CHF. ProBNP elevated 7176. Trop negative X 1. PCCM was asked to consult given worsening respiratory status even when patient is on BiPAP.   She was transferred to the intensive care. She was intubated as she failed to respond to NIPPV. A echocardiogram was obtained and showed grade 1 diastolic dysfunction as well as RV dilation w/ mention of elevated  PAS of 45 mmHg. Therapeutic interventions included: mechanical ventilation, PEEP therapy, and diuresis. She actually got a little hypotensive with the diuresis which requires [place] to give back fluid via fluid challenge on 4/25. She was extubated on 4/26 and continued medical care was titrated. She was initially transferred to telemetry with plans for CPAP. On 4/30 she was noted to have progressive hypercarbia off night time positive pressure. Because of this she was transferred back to the SDU setting. The following days were spent titrating her diuretic regimen and adjusting NIPPV. Her resting ABG off NIPPV showed an PCO2 of 57.2, most recently. We obtained a ABG on NIPPV with PCO2 reading at 47. She seemed to be tolerating her current NIPPV regimen as well as medical regimen well to point that she is now starting to improved with physical therapy. We feel that with her chronic respiratory failure is due to chronic obstructive pulmonary disease, likely fixed airway obstruction, and further complicated by hypoventilation and OSA.  She will require non-invasive mechanic ventilation at night. This will offer her the best opportunity to prevent further episodes of decompensated heart failure. We feel she is ready for discharge with the plan as outlined below.    Discharge Plan by active diagnoses  chronic respiratory failure in setting of COPD further c/b decompensated OSA / OHS   Cough -->felt to b exacerbated by GERD and ICS.  Improved  DNI status   -Supplemental O2 - goals sats 88-92%   -cont oral daily lasix 20mg /daily   -Albuterol PRN   -D/c'd flovent 5/3 due to cough / add gerd rx   -Non-invasive settings:   -Humidified mask of  choice   -bleed in 2 liters Oxygen   -PS 10/peep5   -defer decision about titration study for out-pt after compliance w/ NIPPV verified   Acute on chronic diastolic heart failure  Transient hypotension, resolved with fluids  HTN  CAD  AF  Thoracic aortic aneurysm (4.3  cm - unchanged, Dr. Sherren Mocha Early )  Possible pulmonary hypertension secondary to chronic heart / lung disease  DNR status   ASA, Metoprolol, Lipitor   Imdur 30 mg QD     Contraction alkalosis, improving   -f/u chemistry outpt   Anemia   -f/u cbc   Subclinical hypothyroidism   Defer Levothyroxine   Outpatient follow up   Hx of depression   -cont home rx      Significant Hospital tests/ studies/ interventions and procedures  Consults [place] with cardiology   SIGNIFICANT EVENTS / STUDIES:  4/24 Admitted for CHF and respiratory failure  4/25 Intubated  4/25 Echo >>> EF 50% to 55%. grade 1 diastolic dysfunction. RV dilated. PA peak pressure: 45mm Hg (S).  4/26 Hypotensive due to diuresis and antihypertensive meds. CVP 3. Received NS bolus  4/26 Extubation  4/28 nCPAP started   LINES / TUBES:  ETT 4/25 >>> 4/26  R IJ CVP 4/25 >>>  OGT 4/25 >>> 4/26  R radial A line 4/25 >>> 4/27   CULTURES:  4/25 Blood x2 > neg  4/25 Respiratory > neg  4/26 Blood > neg  4/26 Urine >>> ESCHERICHIA COLI pansensitive  4/26 Respiratory >>> Neg   ANTIBIOTICS:  Ciprofloxacin 4/27 >>>4/28  Keflex 4/29>>>4/30  Rocephin 4/30>>> 5/3   Discharge Exam: BP 139/73  Pulse 72  Temp(Src) 98.2 F (36.8 C) (Oral)  Resp 23  Ht 5\' 5"  (1.651 m)  Wt 88.2 kg (194 lb 7.1 oz)  BMI 32.36 kg/m2  SpO2 93% General: chronic ill appearing. Not in acute distress  Neuro: Alert, no changes in exam  HEENT: jvd absent  Cardiovascular: Regular rhythm, no Murmur  Lungs: resps reduced  Abdomen: soft, distended, no guarding, Bowel sound present  Musculoskeletal: No leg edema  Skin: no rashes    Labs at discharge Lab Results  Component Value Date   CREATININE 1.11* 08/11/2013   BUN 29* 08/11/2013   NA 139 08/11/2013   K 4.1 08/11/2013   CL 91* 08/11/2013   CO2 33* 08/11/2013   Lab Results  Component Value Date   WBC 8.2 08/11/2013   HGB 11.7* 08/11/2013   HCT 38.8 08/11/2013   MCV 89.0 08/11/2013   PLT 196  08/11/2013   Lab Results  Component Value Date   ALT 9 07/31/2013   AST 13 07/31/2013   ALKPHOS 65 07/31/2013   BILITOT 0.6 07/31/2013   Lab Results  Component Value Date   INR 1.09 08/02/2013   INR 1.15 08/01/2013    Current radiology studies Dg Chest Port 1 View  08/10/2013   CLINICAL DATA:  Assess pulmonary edema.  EXAM: PORTABLE CHEST - 1 VIEW  COMPARISON:  08/07/2013.  FINDINGS: Moderate enlargement cardiac silhouette is stable. The aorta is uncoiled. No mediastinal or hilar masses.  Lungs are hyperexpanded. There is stable pleural parenchymal scarring at the apices. Mild reticular opacity at the left lung base is similar to the prior study, likely atelectasis. No convincing pleural effusion. No pulmonary edema. No lung consolidation is seen to suggest pneumonia.  Bony thorax is demineralized but grossly intact.  IMPRESSION: No acute cardiopulmonary disease.  No evidence of pulmonary edema.   Electronically  Signed   By: Lajean Manes M.D.   On: 08/10/2013 14:06    Disposition:  03-Skilled Nursing Facility      Discharge Orders   Future Appointments Provider Department Dept Phone   09/04/2013 9:00 AM Kathee Delton, MD [facility] [phone]   10/06/2013 9:30 AM [facility] [place] [facility] [phone]   Liquids only 4 hours prior to your exam. Any medications can be taken as usual. Please arrive 15 min prior to your scheduled exam time.   10/06/2013 11:15 AM Rosetta Posner, MD [facility] [phone]   Future Orders Complete By Expires   Diet - low sodium heart healthy  As directed    Discharge instructions  As directed    Increase activity slowly  As directed        Medication List    STOP taking these medications       beclomethasone 80 MCG/ACT inhaler  Commonly known as:  QVAR     oxyCODONE-acetaminophen 5-325 MG per tablet  Commonly known as:  PERCOCET     valsartan 160 MG tablet  Commonly known as:   DIOVAN      TAKE these medications       acetaminophen 500 MG tablet  Commonly known as:  TYLENOL  Take 500 mg by mouth every 6 (six) hours as needed for mild pain.     albuterol 108 (90 BASE) MCG/ACT inhaler  Commonly known as:  PROVENTIL HFA;VENTOLIN HFA  Inhale 2 puffs into the lungs every 4 (four) hours as needed. For cough or wheezing     allopurinol 100 MG tablet  Commonly known as:  ZYLOPRIM  Take 100 mg by mouth daily as needed (gout).     amLODipine 5 MG tablet  Commonly known as:  NORVASC  Take 1 tablet (5 mg total) by mouth daily.     aspirin 81 MG tablet  Take 81 mg by mouth at bedtime.     ezetimibe 10 MG tablet  Commonly known as:  ZETIA  Take 10 mg by mouth daily.     furosemide 20 MG tablet  Commonly known as:  LASIX  Take 1 tablet (20 mg total) by mouth daily.     isosorbide mononitrate 30 MG 24 hr tablet  Commonly known as:  IMDUR  Take 30 mg by mouth daily.     metoprolol tartrate 25 MG tablet  Commonly known as:  LOPRESSOR  Take 1 tablet (25 mg total) by mouth 2 (two) times daily.     montelukast 10 MG tablet  Commonly known as:  SINGULAIR  Take 10 mg by mouth every morning.     nitroGLYCERIN 0.4 MG SL tablet  Commonly known as:  NITROSTAT  Place 0.4 mg under the tongue every 5 (five) minutes as needed for chest pain.     NONFORMULARY OR COMPOUNDED ITEM  - -Non-invasive settings:   -  -Humidified mask of choice   -  -bleed in 2 liters Oxygen   -  -PS 10/peep5   -  -defer decision about titration study for out-pt after compliance w/ NIPPV verified     ondansetron 8 MG disintegrating tablet  Commonly known as:  ZOFRAN-ODT  Take 8 mg by mouth every 8 (eight) hours as needed for nausea.     pantoprazole 40 MG tablet  Commonly known as:  PROTONIX  Take 1 tablet (40 mg total) by mouth 2 (two) times daily before a  meal.     potassium chloride 10 MEQ tablet  Commonly known as:  K-DUR,KLOR-CON  Take 10 mEq by mouth daily.      rosuvastatin 20 MG tablet  Commonly known as:  CRESTOR  Take 20 mg by mouth daily with supper.     sertraline 50 MG tablet  Commonly known as:  ZOLOFT  Take 50 mg by mouth daily with breakfast.     tamsulosin 0.4 MG Caps capsule  Commonly known as:  FLOMAX  Take 0.4 mg by mouth at bedtime.     Vitamin D (Ergocalciferol) 50000 UNITS Caps capsule  Commonly known as:  DRISDOL  Take 50,000 Units by mouth every 7 (seven) days.       Follow-up Information   Follow up with Kathee Delton, MD On 09/04/2013. (9am )    Specialty:  Pulmonary Disease   Contact information:   [place] [address] [phone]       Follow up with Jerlyn Ly, MD On 08/19/2013. (9 am)    Specialty:  Internal Medicine   Contact information:   [place] [address] [phone]       Discharged Condition: good  Physician Statement:   The Patient was personally examined, the discharge assessment and plan has been personally reviewed and I agree with ACNP Moroni Nester's assessment and plan. > 30 minutes of time have been dedicated to discharge assessment, planning and discharge instructions.   Signed: Erick Colace 08/12/2013, 8:29 AM

## 2013-08-11 NOTE — Progress Notes (Signed)
Subjective:  Events of weekend reviewed.  Coughing is better.  Much less sleepy now. Husband with a lot of questions  Objective:  Vital Signs in the last 24 hours: BP 138/87  Pulse 60  Temp(Src) 98.0 F (36.7 C) (Oral)  Resp 17  Ht 5\' 5"  (1.651 m)  Wt 88.2 kg (194 lb 7.1 oz)  BMI 32.36 kg/m2  SpO2 93%  Physical Exam: Pleasant WF in NAD Lungs:  Decreased BS, clear Cardiac:  Regular rhythm, normal S1 and S2, no S3 Extremities:  No edema present, chronic venous stasis changes  Weight Filed Weights   08/04/13 0600 08/06/13 0900 08/07/13 0500  Weight: 88.2 kg (194 lb 7.1 oz) 90 kg (198 lb 6.6 oz) 88.2 kg (194 lb 7.1 oz)    Lab Results: Basic Metabolic Panel:  Recent Labs  08/10/13 1700   NA 142   K 5.0   CL 97   CO2 33*   GLUCOSE 173*   BUN 25*   CREATININE 1.09     Telemetry: Sinus  Assessment/Plan:  1. Acute on chronic resp failure with CO2 retention improved 2. Acute on chronic diastolic dysfunction better 3. Contraction alkalosis improved  4. Hypertension better controlled  Rec:  Increase her diuretics some.  If tolerated.   Kerry Hough  MD [facility]  08/11/2013, 1:14 PM

## 2013-08-11 NOTE — Progress Notes (Signed)
Physical Therapy Treatment Patient Details Name: Laura [name] MRN: [id number] DOB: April 15, 1929 Today's Date: 08/11/2013    History of Present Illness Pt is a 79 y/o female admitted with CHF exacerbation after SOB and syncope.    PT Comments    Pt progressing slowly. Today ambulation limited by orthostasis (see vitals in flowsheet). Pt motivated to return home, yet agrees she needs to be mobilizing better and agrees to SNF.   Follow Up Recommendations  SNF;Supervision for mobility/OOB     Equipment Recommendations  None recommended by PT    Recommendations for Other Services       Precautions / Restrictions Precautions Precautions: Fall    Mobility  Bed Mobility                  Transfers Overall transfer level: Needs assistance Equipment used: Rolling walker (2 wheeled) Transfers: Sit to/from Stand Sit to Stand: Supervision         General transfer comment: vc for sequencing; with armrests able to stand with supervision; attempted with hands on knees and pt unable to clear her hips off seat of chair; educated to continue to try to minimize use of UEs during sit to stand and especially stand to sit to strengthen legs  Ambulation/Gait Ambulation/Gait assistance: Min assist;+2 safety/equipment Ambulation Distance (Feet): 15 Feet Assistive device: Rolling walker (2 wheeled) Gait Pattern/deviations: Step-through pattern;Decreased stride length;Drifts right/left;Trunk flexed   Gait velocity interpretation: Below normal speed for age/gender General Gait Details: Limited by increasing dizziness/orthostasis; vc for safe use of [place]   Stairs            Wheelchair Mobility    Modified Rankin (Stroke Patients Only)       Balance           Standing balance support: Single extremity supported Standing balance-Leahy Scale: Poor                      Cognition Arousal/Alertness: Awake/alert Behavior During Therapy: WFL for tasks  assessed/performed Overall Cognitive Status: Within Functional Limits for tasks assessed                      Exercises General Exercises - Upper Extremity Shoulder Flexion: AROM;Both;10 reps;Seated (unilateral) Elbow Flexion: AROM;Both;10 reps;Seated Digit Composite Flexion: AROM;Both;10 reps;Seated General Exercises - Lower Extremity Ankle Circles/Pumps: AROM;Both;10 reps;Seated Long Arc Quad: AROM;Both;15 reps;Seated Hip Flexion/Marching: AROM;Both;15 reps;Seated Heel Raises: AROM;Both;10 reps;Seated (pushing toes into floor and raising heels) Other Exercises Other Exercises: UE exercises used to attempt to incr BP after orthostatic drop with walking Other Exercises: sit to stand x 5 from chair with armrests    General Comments General comments (skin integrity, edema, etc.): Patient and husband upset that she has not had more therapy in recent days. Reviewed missed attempts to see her.       Pertinent Vitals/Pain See vitals flow sheet.     Home Living                      Prior Function            PT Goals (current goals can now be found in the care plan section) Progress towards PT goals: Progressing toward goals    Frequency  Min 2X/week    PT Plan Current plan remains appropriate    Co-evaluation             End of Session Equipment Utilized During Treatment: Gait belt Activity  Tolerance: Patient tolerated treatment well Patient left: in chair;with call bell/phone within reach;with family/visitor present (pt up in chair on arrival)     Time: 4403-4742 PT Time Calculation (min): 32 min  Charges:  $Gait Training: 8-22 mins $Therapeutic Exercise: 8-22 mins                    G Codes:      [employer] Aug 19, 2013, 10:50 AM Pager [phone]

## 2013-08-11 NOTE — Progress Notes (Signed)
Name: Laura [name]  MRN: [id number]  DOB: 06/15/1929  ADMISSION DATE: 07/31/2013  CONSULTATION DATE: 08/05/13  REFERRING MD : [facility]  PRIMARY SERVICE: PCCM  CHIEF COMPLAINT: Acute respiratory failure  BRIEF PATIENT DESCRIPTION: 80 yo with diastolic CHF, OSA, asthma (?) admitted 4/24 with dyspnea and syncope. Patient was somnolent, chest x-ray and physical exam findings were consistent with CHF. ProBNP elevated 7176. Trop negative X 1. PCCM was asked to consult given worsening respiratory status.  SIGNIFICANT EVENTS / STUDIES:  4/24 Admitted for CHF and respiratory failure  4/25 Intubated  4/25 Echo >>> EF 50% to 55%. grade 1 diastolic dysfunction. RV dilated. PA peak pressure: 87mm Hg (S).  4/26 Hypotensive due to diuresis and antihypertensive meds. CVP 3. Received NS bolus  4/26 Extubation  4/28 nCPAP started  LINES / TUBES:  ETT 4/25 >>> 4/26  R IJ CVP 4/25 >>>  OGT 4/25 >>> 4/26  R radial A line 4/25 >>> 4/27  CULTURES:  4/25 Blood x2 > neg  4/25 Respiratory > neg  4/26 Blood > neg  4/26 Urine >>> ESCHERICHIA COLI pansensitive  4/26 Respiratory >>> Neg  ANTIBIOTICS:  Ciprofloxacin 4/27 >>>4/28  Keflex 4/29>>>4/30  Rocephin 4/30>>> 5/3  INTERVAL HISTORY:  No distress. Feels like the NIPPV is helping.  VITAL SIGNS:  BP 139/73  Pulse 72  Temp(Src) 98.2 F (36.8 C) (Oral)  Resp 23  Ht 5\' 5"  (1.651 m)  Wt 88.2 kg (194 lb 7.1 oz)  BMI 32.36 kg/m2  SpO2 93%  Intake/Output Summary (Last 24 hours) at 08/11/13 1344 Last data filed at 08/11/13 0800  Gross per 24 hour  Intake     50 ml  Output    350 ml  Net   -300 ml    PHYSICAL EXAMINATION:  General: chronic ill appearing. Not in acute distress  Neuro: Alert, no changes in exam  HEENT: jvd absent  Cardiovascular: Regular rhythm, no Murmur  Lungs: resps reduced  Abdomen: soft, distended, no guarding, Bowel sound present  Musculoskeletal: No leg edema  Skin: no rashes  LABS:   Recent Labs Lab 08/10/13 0335  08/10/13 1700 08/11/13 0345  NA 141 142 139  K 3.9 5.0 4.1  CL 96 97 91*  CO2 34* 33* 33*  BUN 23 25* 29*  CREATININE 0.97 1.09 1.11*  GLUCOSE 105* 173* 111*    Recent Labs Lab 08/09/13 0406 08/10/13 0335 08/11/13 0345  HGB 9.8* 10.3* 11.7*  HCT 32.0* 33.5* 38.8  WBC 7.5 7.0 8.2  PLT 156 166 196   ABG    Component Value Date/Time   PHART 7.444 08/11/2013 0444   PCO2ART 47.3* 08/11/2013 0444   PO2ART 70.3* 08/11/2013 0444   HCO3 31.9* 08/11/2013 0444   TCO2 33.3 08/11/2013 0444   O2SAT 93.7 08/11/2013 0444     IMAGING:  Dg Chest Port 1 View  08/10/2013  IMPRESSION: No acute cardiopulmonary disease. No evidence of pulmonary edema. Electronically Signed By: Lajean Manes M.D. On: 08/10/2013 14:06   ASSESSMENT / PLAN:  Acute on chronic respiratory failure in setting of decompensated OSA / OHS  Pulmonary edema, improving  Suspect undiagnosed COPD without exacerbation  Cough -->felt to b exacerbated by GERD and ICS.  DNI status  -Supplemental O2 - goals sats 88-92%  -cont oral daily lasix 20mg /daily  -Albuterol PRN  -D/c'd flovent 5/3 due to cough / add gerd rx  -Non-invasive settings:  -Humidified mask of choice  -bleed in 2 liters Oxygen  -PS 10/peep5  -defer  decision about titration study for out-pt after compliance w/ NIPPV verified   Acute on chronic diastolic heart failure  Transient hypotension, resolved with fluids  HTN  CAD  AF  Thoracic aortic aneurysm (4.3 cm - unchanged, Dr. Sherren Mocha Early )  Possible pulmonary hypertension secondary to chronic heart / lung disease  DNR status  ASA, Metoprolol, Lipitor  Imdur 30 mg QD   Hx of ureteral stent in 2014  Hypernatremia resolved  Contraction alkalosis, improving  -f/u chemistry outpt  Anemia  Thrombocytopenia, resolved  -Heparin SQ  -f/u cbc   UTI with E.COLI  Per Micro dashboard above (stop date established)  Subclinical hypothyroidism  Defer Levothyroxine  Outpatient follow up   Acute encephalopathy,  improving>>resolved  Hx of depression  -cont home rx   I have fully examined this patient and agree with above findings.     Extensive d/w pt and husband. Office appt made Reduce IPAP, go out with BIPAP, follow up Dr Fawn Kirk  Appears euvolemic, reduce lasix  Lavon Paganini. Titus Mould, MD, [place] Pgr: [place]

## 2013-08-11 NOTE — Discharge Summary (Signed)
Lasix reduction Sleep med Dr clance BIPAP reduce IPAP  Lavon Paganini. Titus Mould, MD, [place] Pgr: [place] [facility]

## 2013-08-11 NOTE — Discharge Summary (Signed)
Agree with above Lasix back to 20 oral daily, euvolemic now bipap reduction ipap  Lavon Paganini. Titus Mould, MD, [place] Pgr: [place] [facility] [facility]

## 2013-08-11 NOTE — Progress Notes (Signed)
Occupational Therapy Treatment Patient Details Name: Laura [name] MRN: [id number] DOB: 1929/04/25 Today's Date: 08/11/2013    History of present illness Pt is a 80 y/o female admitted with CHF exacerbation after SOB and syncope.   OT comments  Pt is progressing toward goals at this time. Pt demonstrates some mild cognitive deficits. Pt anxious to d/c to snf to get hair styled.  Follow Up Recommendations  SNF;Supervision/Assistance - 24 hour    Equipment Recommendations  Other (comment) (defer snf)    Recommendations for Other Services      Precautions / Restrictions Precautions Precautions: Fall       Mobility Bed Mobility                  Transfers Overall transfer level: Needs assistance Equipment used: Rolling walker (2 wheeled) Transfers: Sit to/from Stand Sit to Stand: Mod assist         General transfer comment: cues for hand placement    Balance Overall balance assessment: Needs assistance Sitting-balance support: No upper extremity supported;Feet supported Sitting balance-Leahy Scale: Good                             ADL Overall ADL's : Needs assistance/impaired Eating/Feeding: Set up   Grooming: Sitting;Set up;Brushing hair;Wash/dry face           Upper Body Dressing : Set up;Sitting   Lower Body Dressing: Moderate assistance;Sit to/from stand Lower Body Dressing Details (indicate cue type and reason): cues to pull up pants completely prior to attempting ambulation Toilet Transfer: Moderate assistance;Ambulation;RW;BSC   Toileting- Clothing Manipulation and Hygiene: Maximal assistance;Sit to/from stand Toileting - Clothing Manipulation Details (indicate cue type and reason): (A) for peri care     Functional mobility during ADLs: Minimal assistance;Rolling walker General ADL Comments: Pt requires cues for hand placement and (A) for sit<>stand. pt dressed for pending d/c to snf [place]      Vision                     Perception     Praxis      Cognition   Behavior During Therapy: [facility] for tasks assessed/performed Overall Cognitive Status: Within Functional Limits for tasks assessed                       Extremity/Trunk Assessment               Exercises     Shoulder Instructions       General Comments      Pertinent Vitals/ Pain       None reported  Home Living                                          Prior Functioning/Environment              Frequency Min 2X/week     Progress Toward Goals  OT Goals(current goals can now be found in the care plan section)  Progress towards OT goals: Progressing toward goals  Acute Rehab OT Goals Patient Stated Goal: to get hair done at [facility] place OT Goal Formulation: With patient Time For Goal Achievement: 08/21/13 Potential to Achieve Goals: Good ADL Goals Pt Will Perform Lower Body Dressing: with supervision;with min guard assist;with adaptive equipment;sit to/from stand Pt Will Transfer  to Toilet: with supervision;bedside commode;ambulating Pt Will Perform Toileting - Clothing Manipulation and hygiene: with supervision;sit to/from stand;with adaptive equipment Pt/caregiver will Perform Home Exercise Program: Both right and left upper extremity;Increased strength;With theraband;With Supervision  Plan Discharge plan remains appropriate    Co-evaluation                 End of Session     Activity Tolerance Patient tolerated treatment well   Patient Left in chair;with call bell/phone within reach   Nurse Communication Mobility status;Precautions        Time: 9030-0923 OT Time Calculation (min): 44 min  Charges: OT General Charges $OT Visit: 1 Procedure OT Treatments $Self Care/Home Management : 38-52 mins  Peri Maris 08/11/2013, 2:47 PM Pager: [phone]

## 2013-08-11 NOTE — Progress Notes (Deleted)
PULMONARY / CRITICAL CARE MEDICINE   Name: Laura [name] MRN: [id number] DOB: 1929/07/02    ADMISSION DATE:  07/31/2013 CONSULTATION DATE:  08/05/13  REFERRING MD : [facility], [facility], [facility]  PRIMARY SERVICE: PCCM   CHIEF COMPLAINT: Acute respiratory failure   BRIEF PATIENT DESCRIPTION: 80 yo with diastolic CHF, OSA, asthma (?) admitted 4/24 with dyspnea and syncope. Patient was somnolent, chest x-ray and physical exam findings were consistent with CHF. ProBNP elevated 7176. Trop negative X 1. PCCM was asked to consult given worsening respiratory status.   SIGNIFICANT EVENTS / STUDIES:  4/24 Admitted for CHF and respiratory failure  4/25 Intubated  4/25 Echo >>> EF 50% to 55%. grade 1 diastolic dysfunction. RV dilated. PA peak pressure: 74mm Hg (S).  4/26 Hypotensive due to diuresis and antihypertensive meds. CVP 3. Received NS bolus  4/26 Extubation  4/28 nCPAP started  LINES / TUBES:  ETT 4/25 >>> 4/26  R IJ CVP 4/25 >>>  OGT 4/25 >>> 4/26  R radial A line 4/25 >>> 4/27   CULTURES:  4/25 Blood x2  > neg 4/25 Respiratory > neg  4/26 Blood > neg  4/26 Urine >>> ESCHERICHIA COLI pansensitive 4/26 Respiratory >>> Neg  ANTIBIOTICS:  Ciprofloxacin 4/27 >>>4/28 Keflex 4/29>>>4/30 Rocephin 4/30>>> 5/3  INTERVAL HISTORY:  No distress. Feels like the NIPPV is helping.   VITAL SIGNS: Temp:  [98 F (36.7 C)-98.2 F (36.8 C)] 98.2 F (36.8 C) (05/05 1200) Pulse Rate:  [60-90] 72 (05/05 1200) Resp:  [20-31] 23 (05/05 1200) BP: (103-159)/(61-85) 139/73 mmHg (05/05 1200) SpO2:  [93 %-98 %] 93 % (05/05 1200) FiO2 (%):  [30 %] 30 % (05/05 0200)  Vent Mode:  [-]  FiO2 (%):  [30 %] 30 %  INTAKE / OUTPUT: Intake/Output     05/04 0701 - 05/05 0700 05/05 0701 - 05/06 0700   P.O.  50   Total Intake(mL/kg)  50 (0.6)   Urine (mL/kg/hr) 250 (0.1) 250 (0.5)   Total Output 250 250   Net -250 -200        Urine Occurrence 3 x    Stool Occurrence 1 x 1 x     PHYSICAL EXAMINATION: General:  chronic ill appearing. Not in acute distress Neuro:   Alert, no changes in exam HEENT:  jvd absent Cardiovascular: Regular rhythm, no Murmur  Lungs: resps reduced  Abdomen: soft, distended, no guarding, Bowel sound present  Musculoskeletal: No leg edema  Skin: no rashes   LABS:  CBC  Recent Labs Lab 08/09/13 0406 08/10/13 0335 08/11/13 0345  WBC 7.5 7.0 8.2  HGB 9.8* 10.3* 11.7*  HCT 32.0* 33.5* 38.8  PLT 156 166 196   Coag's No results found for this basename: APTT, INR,  in the last 168 hours BMET  Recent Labs Lab 08/10/13 0335 08/10/13 1700 08/11/13 0345  NA 141 142 139  K 3.9 5.0 4.1  CL 96 97 91*  CO2 34* 33* 33*  BUN 23 25* 29*  CREATININE 0.97 1.09 1.11*  GLUCOSE 105* 173* 111*   Electrolytes  Recent Labs Lab 08/06/13 0455  08/07/13 0355  08/10/13 0335 08/10/13 1700 08/11/13 0345  CALCIUM 9.2  < > 9.1  < > 9.6 9.9 10.1  MG 2.4  --  2.2  --   --   --  2.5  PHOS 4.3  --  3.9  --   --   --  4.3  < > = values in this interval not displayed. Sepsis Markers No results  found for this basename: LATICACIDVEN, PROCALCITON, O2SATVEN,  in the last 168 hours ABG  Recent Labs Lab 08/08/13 0420 08/09/13 0442 08/11/13 0444  PHART 7.398 7.423 7.444  PCO2ART 57.2* 51.3* 47.3*  PO2ART 86.3 74.0* 70.3*   Liver Enzymes No results found for this basename: AST, ALT, ALKPHOS, BILITOT, ALBUMIN,  in the last 168 hours Cardiac Enzymes  Recent Labs Lab 08/07/13 0355  PROBNP 3897.0*   Glucose No results found for this basename: GLUCAP,  in the last 168 hours IMAGING:  Dg Chest Port 1 View  08/10/2013   CLINICAL DATA:  Assess pulmonary edema.  EXAM: PORTABLE CHEST - 1 VIEW  COMPARISON:  08/07/2013.  FINDINGS: Moderate enlargement cardiac silhouette is stable. The aorta is uncoiled. No mediastinal or hilar masses.  Lungs are hyperexpanded. There is stable pleural parenchymal scarring at the apices. Mild reticular opacity at the left lung base is similar to the  prior study, likely atelectasis. No convincing pleural effusion. No pulmonary edema. No lung consolidation is seen to suggest pneumonia.  Bony thorax is demineralized but grossly intact.  IMPRESSION: No acute cardiopulmonary disease.  No evidence of pulmonary edema.   Electronically Signed   By: Lajean Manes M.D.   On: 08/10/2013 14:06   ASSESSMENT / PLAN:  Acute on chronic respiratory failure in setting of decompensated OSA / OHS Pulmonary edema, improving Suspect undiagnosed COPD without exacerbation Cough -->felt to b exacerbated by GERD and ICS.  DNI status   -Supplemental O2 - goals sats 88-92%   -cont oral daily lasix 20mg /daily  -Albuterol PRN   -D/c'd flovent 5/3 due to cough / add gerd rx   -Non-invasive settings:    -Humidified mask of choice   -bleed in 2 liters Oxygen   -PS 10/peep5   -defer decision about titration study for out-pt after compliance w/ NIPPV verified    Acute on chronic diastolic heart failure  Transient hypotension, resolved with fluids  HTN  CAD  AF  Thoracic aortic aneurysm (4.3 cm - unchanged, Dr. Sherren Mocha Early )  Possible pulmonary hypertension secondary to chronic heart / lung disease  DNR status    ASA, Metoprolol, Lipitor   Imdur 30 mg QD   Hx of ureteral stent in 2014  Hypernatremia resolved Contraction alkalosis, improving  -f/u chemistry outpt    Anemia  Thrombocytopenia, resolved  -Heparin SQ   -f/u cbc   UTI with E.COLI   Per  Micro dashboard above (stop date established)     Subclinical hypothyroidism   Defer Levothyroxine   Outpatient follow up    Acute encephalopathy, improving>>resolved Hx of depression   -cont home rx

## 2013-08-11 NOTE — Progress Notes (Addendum)
Subjective:  Improved with less somnolence.  No chest pain.  Mildly orthostatic this am.  Objective:  Vital Signs in the last 24 hours: BP 139/73  Pulse 72  Temp(Src) 98.2 F (36.8 C) (Oral)  Resp 23  Ht 5\' 5"  (1.651 m)  Wt 88.2 kg (194 lb 7.1 oz)  BMI 32.36 kg/m2  SpO2 93%  Physical Exam: Pleasant WF on Bipap Lungs:  Decreased BS Cardiac:  Regular rhythm, normal S1 and S2, no S3 Extremities:  No edema present, chronic venous stasis changes  Intake/Output from previous day: 05/04 0701 - 05/05 0700 In: -  Out: 250 [Urine:250]  Weight Filed Weights   08/04/13 0600 08/06/13 0900 08/07/13 0500  Weight: 88.2 kg (194 lb 7.1 oz) 90 kg (198 lb 6.6 oz) 88.2 kg (194 lb 7.1 oz)    Lab Results: Basic Metabolic Panel:  Recent Labs  08/10/13 1700 08/11/13 0345  NA 142 139  K 5.0 4.1  CL 97 91*  CO2 33* 33*  GLUCOSE 173* 111*  BUN 25* 29*  CREATININE 1.09 1.11*    CBC:  Recent Labs  08/10/13 0335 08/11/13 0345  WBC 7.0 8.2  HGB 10.3* 11.7*  HCT 33.5* 38.8  MCV 88.9 89.0  PLT 166 196    BNP    Component Value Date/Time   PROBNP 3897.0* 08/07/2013 0355   Telemetry: Sinus  Assessment/Plan:  1. Acute on chronic resp failure with CO2 retention improved 2. Acute on chronic diastolic dysfunction better 3. Contraction alkalosis improved  4. Hypertension better controlled  Rec:  Pulmonary is coming up with a discharge plan.  Much improved.  Suspect she is close to her dry weight.    Kerry Hough  MD [facility]  08/11/2013, 1:05 PM

## 2013-08-11 NOTE — Progress Notes (Signed)
Patient is alert and oriented, sitting in the chair with husband.  Report given to [facility] from [facility].

## 2013-08-12 ENCOUNTER — Non-Acute Institutional Stay (SKILLED_NURSING_FACILITY): Payer: Medicare Other | Admitting: Adult Health

## 2013-08-12 ENCOUNTER — Encounter: Payer: Self-pay | Admitting: *Deleted

## 2013-08-12 ENCOUNTER — Inpatient Hospital Stay: Payer: Medicare Other | Admitting: Adult Health

## 2013-08-12 DIAGNOSIS — F3289 Other specified depressive episodes: Secondary | ICD-10-CM

## 2013-08-12 DIAGNOSIS — J45909 Unspecified asthma, uncomplicated: Secondary | ICD-10-CM

## 2013-08-12 DIAGNOSIS — I119 Hypertensive heart disease without heart failure: Secondary | ICD-10-CM

## 2013-08-12 DIAGNOSIS — E785 Hyperlipidemia, unspecified: Secondary | ICD-10-CM

## 2013-08-12 DIAGNOSIS — K219 Gastro-esophageal reflux disease without esophagitis: Secondary | ICD-10-CM

## 2013-08-12 DIAGNOSIS — I5032 Chronic diastolic (congestive) heart failure: Secondary | ICD-10-CM

## 2013-08-12 DIAGNOSIS — F32A Depression, unspecified: Secondary | ICD-10-CM

## 2013-08-12 DIAGNOSIS — F329 Major depressive disorder, single episode, unspecified: Secondary | ICD-10-CM

## 2013-08-12 DIAGNOSIS — I2510 Atherosclerotic heart disease of native coronary artery without angina pectoris: Secondary | ICD-10-CM

## 2013-08-12 DIAGNOSIS — J9620 Acute and chronic respiratory failure, unspecified whether with hypoxia or hypercapnia: Secondary | ICD-10-CM

## 2013-08-12 NOTE — Progress Notes (Signed)
[facility] arrived to transport patient to [facility]. 

## 2013-08-13 ENCOUNTER — Non-Acute Institutional Stay (SKILLED_NURSING_FACILITY): Payer: Medicare Other | Admitting: Internal Medicine

## 2013-08-13 DIAGNOSIS — I119 Hypertensive heart disease without heart failure: Secondary | ICD-10-CM

## 2013-08-13 DIAGNOSIS — I2510 Atherosclerotic heart disease of native coronary artery without angina pectoris: Secondary | ICD-10-CM

## 2013-08-13 DIAGNOSIS — I509 Heart failure, unspecified: Secondary | ICD-10-CM

## 2013-08-13 DIAGNOSIS — J449 Chronic obstructive pulmonary disease, unspecified: Secondary | ICD-10-CM

## 2013-08-15 DIAGNOSIS — J431 Panlobular emphysema: Secondary | ICD-10-CM | POA: Insufficient documentation

## 2013-08-15 NOTE — Discharge Summary (Signed)
Agree with above in additon to my prior comments

## 2013-08-15 NOTE — Progress Notes (Signed)
HISTORY & PHYSICAL  DATE: 08/13/2013   FACILITY: [facility]  LEVEL OF CARE: SNF (31)  ALLERGIES:  Allergies  Allergen Reactions  . Codeine Nausea And Vomiting  . Hydrocodone Other (See Comments)    delusions  . Morphine And Related Other (See Comments)    Extremely cofused    CHIEF COMPLAINT:  Manage COPD, CHF and CAD  HISTORY OF PRESENT ILLNESS: Patient is an 80 year old Caucasian female who was hospitalized secondary to acute respiratory failure. After hospitalization she is admitted to this facility for short-term rehabilitation.  CAD: The angina has been stable. The patient denies dyspnea on exertion, orthopnea, pedal edema, palpitations and paroxysmal nocturnal dyspnea. No complications noted from the medication presently being used.  CHF:The patient does not relate significant weight changes, denies sob, DOE, orthopnea, PNDs, pedal edema, palpitations or chest pain.  CHF remains stable.  No complications form the medications being used.  COPD: the COPD remains stable.  Pt denies sob, cough, wheezing or declining exercise tolerance.  No complications from the medications presently being used.  PAST MEDICAL HISTORY :  Past Medical History  Diagnosis Date  . Coronary artery disease   . Myocardial infarction   . Arthritis   . Asthma   . Chronic bronchitis   . Urinary tract infection   . Kidney stone   . Hydronephrosis   . Complication of anesthesia   . PONV (postoperative nausea and vomiting)   . Dysrhythmia   . Peripheral vascular disease   . CHF (congestive heart failure)   . Sleep apnea   . Shortness of breath   . Depression   . Hypernatremia     resolved  . Anemia   . Thrombocytopenia     resolved  . Subclinical hypothyroidism   . Thoracic aortic aneurysm   . Obesity hypoventilation syndrome   . Hypertension     pulmonary hypotension secondary to chronic heart/lung disease  . Acute encephalopathy     resolved  .  Transient hypotension     resolved with fluids  . COPD (chronic obstructive pulmonary disease)   . Nephrolithiasis     PAST SURGICAL HISTORY: Past Surgical History  Procedure Laterality Date  . Coronary stent placement    . Appendectomy    . Tonsillectomy    . Knee surgery    . Cystoscopy w/ ureteral stent placement Right 04/04/2013    Procedure: CYSTOSCOPY WITH RETROGRADE PYELOGRAM/URETERAL STENT PLACEMENT;  Surgeon: Molli Hazard, MD;  Location: [facility];  Service: Urology;  Laterality: Right;  . Cystoscopy with retrograde pyelogram, ureteroscopy and stent placement Right 04/30/2013    Procedure: CYSTOSCOPY WITH RETROGRADE PYELOGRAM, URETEROSCOPY AND STENT EXCHANGE;  Surgeon: Molli Hazard, MD;  Location: [facility];  Service: Urology;  Laterality: Right;  . Holmium laser application Right 8/93/8101    Procedure: HOLMIUM LASER APPLICATION;  Surgeon: Molli Hazard, MD;  Location: [facility];  Service: Urology;  Laterality: Right;    SOCIAL HISTORY:  reports that she has never smoked. She has never used smokeless tobacco. She reports that she does not drink alcohol or use illicit drugs.  FAMILY HISTORY:  Family History  Problem Relation Age of Onset  . Adopted: Yes    CURRENT MEDICATIONS: Reviewed per MAR/see medication list  REVIEW OF SYSTEMS:  See HPI otherwise 14 point ROS is negative.  PHYSICAL EXAMINATION  VS:  See VS section  GENERAL: no acute distress, moderately obese body habitus EYES:  conjunctivae normal, sclerae normal, normal eye lids MOUTH/THROAT: lips without lesions,no lesions in the mouth,tongue is without lesions,uvula elevates in midline NECK: supple, trachea midline, no neck masses, no thyroid tenderness, no thyromegaly LYMPHATICS: no LAN in the neck, no supraclavicular LAN RESPIRATORY: breathing is even & unlabored, BS CTAB CARDIAC: RRR, no murmur,no extra heart sounds, no edema GI:  ABDOMEN: abdomen soft, normal BS, no masses, no  tenderness  LIVER/SPLEEN: no hepatomegaly, no splenomegaly MUSCULOSKELETAL: HEAD: normal to inspection & palpation BACK: no kyphosis, scoliosis or spinal processes tenderness EXTREMITIES: LEFT UPPER EXTREMITY: full range of motion, normal strength & tone RIGHT UPPER EXTREMITY:  full range of motion, normal strength & tone LEFT LOWER EXTREMITY: Moderate range of motion, normal strength & tone RIGHT LOWER EXTREMITY: Moderate range of motion, normal strength & tone PSYCHIATRIC: the patient is alert & oriented to person, affect & behavior appropriate  LABS/RADIOLOGY:  Labs reviewed: Basic Metabolic Panel:  Recent Labs  08/06/13 0455  08/07/13 0355  08/10/13 0335 08/10/13 1700 08/11/13 0345  NA 145  < > 145  < > 141 142 139  K 4.1  < > 3.8  < > 3.9 5.0 4.1  CL 104  < > 100  < > 96 97 91*  CO2 31  < > 33*  < > 34* 33* 33*  GLUCOSE 110*  < > 101*  < > 105* 173* 111*  BUN 18  < > 22  < > 23 25* 29*  CREATININE 0.80  < > 0.94  < > 0.97 1.09 1.11*  CALCIUM 9.2  < > 9.1  < > 9.6 9.9 10.1  MG 2.4  --  2.2  --   --   --  2.5  PHOS 4.3  --  3.9  --   --   --  4.3  < > = values in this interval not displayed. Liver Function Tests:  Recent Labs  07/31/13 1731  AST 13  ALT 9  ALKPHOS 65  BILITOT 0.6  PROT 7.1  ALBUMIN 3.4*   CBC:  Recent Labs  04/03/13 2031  08/02/13 0300  08/09/13 0406 08/10/13 0335 08/11/13 0345  WBC 18.4*  < >  --   < > 7.5 7.0 8.2  NEUTROABS 16.9*  --  7.7  --   --   --   --   HGB 11.5*  < >  --   < > 9.8* 10.3* 11.7*  HCT 36.3  < >  --   < > 32.0* 33.5* 38.8  MCV 81.0  < >  --   < > 88.9 88.9 89.0  PLT 129*  < >  --   < > 156 166 196  < > = values in this interval not displayed.  Cardiac Enzymes:  Recent Labs  08/01/13 0850  TROPONINI <0.30   CBG:  Recent Labs  08/02/13 1601 08/02/13 1933 08/04/13 0747  GLUCAP 119* 106* 96    Transthoracic Echocardiography  Patient:    Keoka, Tomczyk MR #:       [id number] Study Date:  08/01/2013 Gender:     F Age:        52 Height:     162.6cm Weight:     92.1kg BSA:        1.79m^2 Pt. Status: Room:       [place], MD [facility]  SONOGRAPHER  Mauricio Po, RDCS, CCT  Everlene Balls,  [place]  ATTENDING    Christianto, Mario cc:  ------------------------------------------------------------ LV EF: 50% -   55%  ------------------------------------------------------------ Indications:      Dyspnea 786.09.  ------------------------------------------------------------ History:   PMH:   Coronary artery disease.  Congestive heart failure.  Risk factors:  Thoracic aneurysm. Hypertension. Dyslipidemia.  ------------------------------------------------------------ Study Conclusions  - Left ventricle: The cavity size was normal. Systolic   function was normal. The estimated ejection fraction was   in the range of 50% to 55%. Hypokinesis of the   basal-midinferior myocardium. Doppler parameters are   consistent with abnormal left ventricular relaxation   (grade 1 diastolic dysfunction). - Aortic valve: Trivial regurgitation. - Mitral valve: Calcified annulus. - Right ventricle: The cavity size was mildly dilated. Wall   thickness was normal. - Pulmonary arteries: PA peak pressure: 38mm Hg (S). Transthoracic echocardiography.  M-mode, complete 2D, spectral Doppler, and color Doppler.  Height:  Height: 162.6cm. Height: 64in.  Weight:  Weight: 92.1kg. Weight: 202.6lb.  Body mass index:  BMI: 34.8kg/m^2.  Body surface area:    BSA: 1.38m^2.  Blood pressure:     181/65.  Patient status:  Inpatient.  Location:  ICU/CCU  ------------------------------------------------------------  ------------------------------------------------------------ Left ventricle:  The cavity size was normal. Systolic function was normal. The estimated ejection fraction was in the range of 50% to 55%.  Regional wall motion abnormalities:   Hypokinesis of the  basal-midinferior myocardium. Doppler parameters are consistent with abnormal left ventricular relaxation (grade 1 diastolic dysfunction).   ------------------------------------------------------------ Aortic valve:   Mildly thickened, mildly calcified leaflets. Cusp separation was normal.  Doppler:  Transvalvular velocity was within the normal range. There was no stenosis.  Trivial regurgitation.  ------------------------------------------------------------ Aorta:  The aorta was normal, not dilated, and non-diseased.   ------------------------------------------------------------ Mitral valve:   Calcified annulus. Leaflet separation was normal.  Doppler:  Transvalvular velocity was within the normal range. There was no evidence for stenosis.  No regurgitation.    Peak gradient: 56mm Hg (D).  ------------------------------------------------------------ Left atrium:  The atrium was normal in size.  ------------------------------------------------------------ Right ventricle:  The cavity size was mildly dilated. Wall thickness was normal. Systolic function was normal.  ------------------------------------------------------------ Pulmonic valve:   Poorly visualized.  Structurally normal valve.   Cusp separation was normal.  Doppler: Transvalvular velocity was within the normal range.  No regurgitation.  ------------------------------------------------------------ Tricuspid valve:  Poorly visualized.  Structurally normal valve.   Leaflet separation was normal.  Doppler: Transvalvular velocity was within the normal range.  Trivial regurgitation.  ------------------------------------------------------------ Pulmonary artery:   Poorly visualized. The main pulmonary artery was normal-sized.  ------------------------------------------------------------ Right atrium:  The atrium was normal in size.  ------------------------------------------------------------ Pericardium:  A  prominent pericardial fat pad was present. There was no pericardial effusion.  ------------------------------------------------------------ Systemic veins: Inferior vena cava: The vessel was dilated; the respirophasic diameter changes were blunted (< 50%); findings are consistent with elevated central venous pressure.  ------------------------------------------------------------ Post procedure conclusions Ascending Aorta:  - The aorta was normal, not dilated, and non-diseased.  ------------------------------------------------------------  2D measurements        Normal  Doppler               Normal Left ventricle                 measurements LVID ED,   35.1 mm     43-52   Main pulmonary chord,  artery PLAX                           Pressure, S    45 mm  =30 LVID ES,   27.5 mm     23-38                     Hg chord,                         Left ventricle PLAX                           Ea, lat       6.8 cm/ ------- FS, chord,   22 %      >29     ann, tiss         s PLAX                           DP LVPW, ED   11.1 mm     ------  E/Ea, lat   11.26     ------- IVS/LVPW   0.89        <1.3    ann, tiss ratio, ED                      DP Ventricular septum             Ea, med       4.2 cm/ ------- IVS, ED    9.92 mm     ------  ann, tiss         s Aorta                          DP Root diam,   32 mm     ------  E/Ea, med   18.24     ------- ED                             ann, tiss Left atrium                    DP AP dim       34 mm     ------  Mitral valve AP dim     1.73 cm/m^2 <2.2    Peak E vel   76.6 cm/ ------- index                                            s Vol, S       85 ml     ------  Peak A vel     82 cm/ ------- Vol index, 43.1 ml/m^2 ------                    s S                              Deceleratio   254 ms  150-230  n time                                Peak            2 mm  -------                                 gradient, D       Hg                                Peak E/A      0.9     -------                                ratio                                Tricuspid valve                                Regurg peak   276 cm/ -------                                vel               s                                Peak RV-RA     30 mm  -------                                gradient, S       Hg                                Max regurg    276 cm/ -------                                vel               s                                Systemic veins                                Estimated      15 mm  -------                                CVP               Hg  Right ventricle                                Pressure, S    45 mm  <30                                                  Hg                                Sa vel, lat  13.3 cm/ -------                                ann, tiss         s                                DP Noninvasive Vascular Lab  Bilateral Lower Extremity Venous Duplex Evaluation  Patient:    Takyla, Kuchera MR #:       [id number] Study Date: 08/03/2013 Gender:     F Age:        80 Height: Weight: BSA: Pt. Status: Room:       [place], RVS  Earl [place], [place] also to:  ------------------------------------------------------------ History and indications:  Indications  786.05 Shortness of breath.  History  Diagnostic evaluation.  ------------------------------------------------------------ Study information:  Study status:  Routine.  Procedure:  A vascular evaluation was performed with the patient in the supine position. The right common femoral, right femoral, right greater saphenous, right lesser saphenous, right profunda femoral, right popliteal, right peroneal, right  posterior tibial, left common femoral, left femoral, left greater saphenous, left lesser saphenous, left profunda femoral, left popliteal, left peroneal, and left posterior tibial veins were studied. Image quality was adequate.    Bilateral lower extremity venous duplex evaluation.     Doppler flow study including B-mode compression maneuvers of all visualized segments, color flow Doppler and selected views of pulsed wave Doppler.  Location:  CCU  Patient status:  Inpatient.  Venous flow:  +--------------------------+-------+----------------------+ Location                  OverallFlow properties        +--------------------------+-------+----------------------+ Right common femoral      Patent Phasic; spontaneous;                                    compressible           +--------------------------+-------+----------------------+ Right femoral             Patent Compressible           +--------------------------+-------+----------------------+ Right profunda femoral    Patent Compressible           +--------------------------+-------+----------------------+ Right popliteal           Patent Phasic; spontaneous;  compressible           +--------------------------+-------+----------------------+ Right posterior tibial    Patent Compressible           +--------------------------+-------+----------------------+ Right peroneal            Patent Compressible           +--------------------------+-------+----------------------+ Right saphenofemoral      Patent Compressible           junction                                                +--------------------------+-------+----------------------+ Right greater saphenous   Patent Compressible           +--------------------------+-------+----------------------+ Right lesser saphenous    Patent Compressible            +--------------------------+-------+----------------------+ Left common femoral       Patent Phasic; spontaneous;                                    compressible           +--------------------------+-------+----------------------+ Left femoral              Patent Compressible           +--------------------------+-------+----------------------+ Left profunda femoral     Patent Compressible           +--------------------------+-------+----------------------+ Left popliteal            Patent Phasic; spontaneous;                                    compressible           +--------------------------+-------+----------------------+ Left posterior tibial     Patent Compressible           +--------------------------+-------+----------------------+ Left peroneal             Patent Compressible           +--------------------------+-------+----------------------+ Left saphenofemoral       Patent Compressible           junction                                                +--------------------------+-------+----------------------+ Left greater saphenous    Patent Compressible           +--------------------------+-------+----------------------+ Left lesser saphenous     Patent Compressible           +--------------------------+-------+----------------------+  ------------------------------------------------------------ Summary:  - No evidence of deep vein or superficial thrombosis   involving the right lower extremity and left lower   extremity. - No evidence of Baker's cyst on the right or left. Other specific details can be found in the table(s) above.    Prepared and Electronically Authenticated by  Gae Gallop PORTABLE ABDOMEN - 1 VIEW   COMPARISON:  Chest x-ray obtained earlier today   FINDINGS: The tip of the gastric tube projects over the gastric fundus. Marked enlargement of the cardiopericardial silhouette. The bowel  gas pattern is not obstructed. No free air. Degenerative changes throughout the thoracic spine. Normal bony mineralization.  IMPRESSION: The tip of the gastric tube projects over the gastric fundus. PORTABLE CHEST - 1 VIEW   COMPARISON:  08/07/2013.   FINDINGS: Moderate enlargement cardiac silhouette is stable. The aorta is uncoiled. No mediastinal or hilar masses.   Lungs are hyperexpanded. There is stable pleural parenchymal scarring at the apices. Mild reticular opacity at the left lung base is similar to the prior study, likely atelectasis. No convincing pleural effusion. No pulmonary edema. No lung consolidation is seen to suggest pneumonia.   Bony thorax is demineralized but grossly intact.   IMPRESSION: No acute cardiopulmonary disease.  No evidence of pulmonary edema.   EXAM: CHEST  2 VIEW   COMPARISON:  05/22/2011   FINDINGS: Moderate enlargement of the cardiac silhouette. The aorta is uncoiled and tortuous. No mediastinal or hilar masses.   No lung consolidation or edema. No convincing pleural effusion and no pneumothorax. The bony thorax is demineralized but grossly intact.   IMPRESSION: No acute cardiopulmonary disease.   EXAM: CT ABDOMEN AND PELVIS WITHOUT CONTRAST   TECHNIQUE: Multidetector CT imaging of the abdomen and pelvis was performed following the standard protocol without intravenous contrast.   COMPARISON:  02/12/2012   FINDINGS: Atelectasis is noted in both lung bases. The heart size is enlarged. No pleural or pericardial. No focal liver abnormality identified. Sludge and/or stones noted within the dependent portion of gallbladder. No biliary dilatation. The pancreas is unremarkable. Normal appearance of the spleen.   The adrenal glands are both normal. Asymmetric right-sided hydronephrosis noted. Stone at the right UPJ measures 0.5 1.3 cm, image 46/series 5. The left kidney appears within normal limits. The urinary bladder appears  normal. The uterus is unremarkable. Cyst in the left ovary measures 3.5 cm.   There is calcified atherosclerotic disease affecting the abdominal aorta and its branches. No aneurysm. There is no upper abdominal or pelvic adenopathy.   The patient has a small hiatal hernia. The small bowel loops have a normal course and caliber no obstruction. Normal appearance of the proximal colon. Multiple distal colonic diverticula are identified without evidence for obstruction.   Review of the visualized osseous structures is significant for mild lumbar spondylosis.   IMPRESSION: 1. Stone at the right UPJ is identified resulting and asymmetric right-sided hydronephrosis. 2. Atherosclerotic disease. 3. Cyst in the left ovary. This is almost certainly benign, but follow up ultrasound is recommended in 1 year according to the Society of Radiologists in [place] Statement (D Clovis Riley et al. Management of Asymptomatic Ovarian and Other Adnexal Cysts Imaged at [place]: Society of Radiologists in [place] Statement 2010. Radiology 256 (Sept 2010): [id number].).  ASSESSMENT/PLAN:  CHF-compensated COPD-compensated CAD-stable Hypertension-controlled Pulmonary hypertension-no acute issues GERD-continue PPI Check CBC and BMP  I have reviewed patient's medical records received at admission/from hospitalization.  CPT CODE: 02725  Gayani Y Dasanayaka, [place] [phone]

## 2013-08-21 ENCOUNTER — Encounter: Payer: Self-pay | Admitting: *Deleted

## 2013-08-21 ENCOUNTER — Non-Acute Institutional Stay (SKILLED_NURSING_FACILITY): Payer: Medicare Other | Admitting: Adult Health

## 2013-08-21 ENCOUNTER — Encounter: Payer: Self-pay | Admitting: Adult Health

## 2013-08-21 DIAGNOSIS — F3289 Other specified depressive episodes: Secondary | ICD-10-CM

## 2013-08-21 DIAGNOSIS — I2510 Atherosclerotic heart disease of native coronary artery without angina pectoris: Secondary | ICD-10-CM

## 2013-08-21 DIAGNOSIS — J45909 Unspecified asthma, uncomplicated: Secondary | ICD-10-CM

## 2013-08-21 DIAGNOSIS — F32A Depression, unspecified: Secondary | ICD-10-CM

## 2013-08-21 DIAGNOSIS — I5032 Chronic diastolic (congestive) heart failure: Secondary | ICD-10-CM

## 2013-08-21 DIAGNOSIS — K219 Gastro-esophageal reflux disease without esophagitis: Secondary | ICD-10-CM

## 2013-08-21 DIAGNOSIS — E785 Hyperlipidemia, unspecified: Secondary | ICD-10-CM

## 2013-08-21 DIAGNOSIS — I10 Essential (primary) hypertension: Secondary | ICD-10-CM

## 2013-08-21 DIAGNOSIS — I509 Heart failure, unspecified: Secondary | ICD-10-CM

## 2013-08-21 DIAGNOSIS — F329 Major depressive disorder, single episode, unspecified: Secondary | ICD-10-CM

## 2013-08-21 NOTE — Progress Notes (Signed)
Patient ID: Laura [name], female   DOB: 03-Feb-1930, 80 y.o.   MRN: [id number]                 PROGRESS NOTE  DATE: 08/21/13  FACILITY: Nursing Home Location: [facility]  LEVEL OF CARE: SNF (31)  Acute Visit  CHIEF COMPLAINT:  Discharge Notes  HISTORY OF PRESENT ILLNESS: This is an 80 year old female who is for discharge home with Home health PT, OT and Nursing. She has been admitted to [facility] on 08/11/13 from [facility] with Acute on chronic respiratory failure in setting of decompensated OSA/OHS. Patient was admitted to this facility for short-term rehabilitation after the patient's recent hospitalization.  Patient has completed SNF rehabilitation and therapy has cleared the patient for discharge.   REASSESSMENT OF ONGOING PROBLEM(S):  HTN: Pt 's HTN remains stable.  Denies CP, sob, DOE, pedal edema, headaches, dizziness or visual disturbances.  No complications from the medications currently being used.  Last BP : 134/60  ASTHMA: The patient's asthma remains stable. Patient denies shortness of breath, dyspnea on exertion or wheezing. No complications reported from the medications currently being used.  GERD: pt's GERD is stable.  Denies ongoing heartburn, abd. Pain, nausea or vomiting.  Currently on a PPI & tolerates it without any adverse reactions.   PAST MEDICAL HISTORY : Reviewed.  No changes/see problem list  CURRENT MEDICATIONS: Reviewed per MAR/see medication list  REVIEW OF SYSTEMS:  GENERAL: no change in appetite, no fatigue, no weight changes, no fever, chills or weakness RESPIRATORY: no cough, SOB, DOE, wheezing, hemoptysis CARDIAC: no chest pain, edema or palpitations GI: no abdominal pain, diarrhea, constipation, heart burn, nausea or vomiting  PHYSICAL EXAMINATION  GENERAL: no acute distress, obese  NECK: supple, trachea midline, no neck masses, no thyroid tenderness, no thyromegaly RESPIRATORY: breathing is even &  unlabored, BS CTAB CARDIAC: RRR, no murmur,no extra heart sounds, no edema GI: abdomen soft, normal BS, no masses, no tenderness, no hepatomegaly, no splenomegaly EXTREMITIES: able to move all 4 extremities PSYCHIATRIC: the patient is alert & oriented to person, affect & behavior appropriate  LABS/RADIOLOGY: 08/14/13 sodium 137 potassium 4.1 glucose 142 BUN 31 creatinine 1.1 WBC 8.3 hemoglobin 10.5 hematocrit 36.7 08/13/13 TSH 4.6060 T4, total 8.12 T3, free2.60 T3 Uptake 40.700 08/12/13 supine abdomen x-ray shows no bowel obstruction or ileus Labs reviewed: Basic Metabolic Panel:  Recent Labs  08/06/13 0455  08/07/13 0355  08/10/13 0335 08/10/13 1700 08/11/13 0345  NA 145  < > 145  < > 141 142 139  K 4.1  < > 3.8  < > 3.9 5.0 4.1  CL 104  < > 100  < > 96 97 91*  CO2 31  < > 33*  < > 34* 33* 33*  GLUCOSE 110*  < > 101*  < > 105* 173* 111*  BUN 18  < > 22  < > 23 25* 29*  CREATININE 0.80  < > 0.94  < > 0.97 1.09 1.11*  CALCIUM 9.2  < > 9.1  < > 9.6 9.9 10.1  MG 2.4  --  2.2  --   --   --  2.5  PHOS 4.3  --  3.9  --   --   --  4.3  < > = values in this interval not displayed.  Liver Function Tests:  Recent Labs  07/31/13 1731  AST 13  ALT 9  ALKPHOS 65  BILITOT 0.6  PROT 7.1  ALBUMIN 3.4*   CBC:  Recent Labs  04/03/13 2031  08/02/13 0300  08/09/13 0406 08/10/13 0335 08/11/13 0345  WBC 18.4*  < >  --   < > 7.5 7.0 8.2  NEUTROABS 16.9*  --  7.7  --   --   --   --   HGB 11.5*  < >  --   < > 9.8* 10.3* 11.7*  HCT 36.3  < >  --   < > 32.0* 33.5* 38.8  MCV 81.0  < >  --   < > 88.9 88.9 89.0  PLT 129*  < >  --   < > 156 166 196  < > = values in this interval not displayed.  Cardiac Enzymes:  Recent Labs  08/01/13 0850  TROPONINI <0.30   CBG:  Recent Labs  08/02/13 1601 08/02/13 1933 08/04/13 0747  GLUCAP 119* 106* 96    ASSESSMENT/PLAN:  Acute on chronic respiratory failure - resolved Obstructive sleep apnea - on CPAP at night Chronic diastolic  heart failure - continue Lasix Hypertension - well controlled; continue Norvasc and Lopressor CAD - continue aspirin and imdur Hyperlipidemia - continue Zetia and Crestor GERD - continue protonix Depression - continue Zoloft Asthma - stable; continue Singulair   I have filled out patient's discharge paperwork and written prescriptions.  Patient will receive home health PT, OT and Nursing.   Total discharge time: Less than 30 minutes Discharge time involved coordination of the discharge process with Education officer, museum, nursing staff and therapy department. Medical justification for home health services verified.   CPT CODE: 27741   Seth Bake - NP [facility] [phone]

## 2013-08-21 NOTE — Progress Notes (Signed)
Patient ID: Laura [name], female   DOB: [birth year], 80 y.o.   MRN: [id number]               PROGRESS NOTE  DATE: 08/12/2013  FACILITY: Nursing Home Location: [facility]  LEVEL OF CARE: SNF (31)  Acute Visit  CHIEF COMPLAINT:  Follow-up Hospitalization  HISTORY OF PRESENT ILLNESS: This is an 80 year old female who has been admitted to [facility] on 08/11/13 from [facility] with Acute on chronic respiratory failure in setting of decompensated OSA/OHS. She has been admitted for a short-term rehabilitation.  REASSESSMENT OF ONGOING PROBLEM(S):  HTN: Pt 's HTN remains stable.  Denies CP, sob, DOE, pedal edema, headaches, dizziness or visual disturbances.  No complications from the medications currently being used.  Last BP : 118/62  CAD: The angina has been stable. The patient denies dyspnea on exertion, orthopnea, pedal edema, palpitations and paroxysmal nocturnal dyspnea. No complications noted from the medication presently being used.  CHF:The patient does not relate significant weight changes, denies sob, DOE, orthopnea, PNDs, pedal edema, palpitations or chest pain.  CHF remains stable.  No complications form the medications being used.  PAST MEDICAL HISTORY : Reviewed.  No changes/see problem list  CURRENT MEDICATIONS: Reviewed per MAR/see medication list  REVIEW OF SYSTEMS:  GENERAL: no change in appetite, no fatigue, no weight changes, no fever, chills or weakness RESPIRATORY: no cough, SOB, DOE, wheezing, hemoptysis CARDIAC: no chest pain, edema or palpitations GI: no abdominal pain, diarrhea, constipation, heart burn, nausea or vomiting  PHYSICAL EXAMINATION  GENERAL: no acute distress, obese  EYES: conjunctivae normal, sclerae normal, normal eye lids NECK: supple, trachea midline, no neck masses, no thyroid tenderness, no thyromegaly LYMPHATICS: no LAN in the neck, no supraclavicular LAN RESPIRATORY: breathing is even & unlabored, BS  CTAB CARDIAC: RRR, no murmur,no extra heart sounds, no edema GI: abdomen soft, normal BS, no masses, no tenderness, no hepatomegaly, no splenomegaly EXTREMITIES: able to move all 4 extremities PSYCHIATRIC: the patient is alert & oriented to person, affect & behavior appropriate  LABS/RADIOLOGY: Labs reviewed: Basic Metabolic Panel:  Recent Labs  08/06/13 0455  08/07/13 0355  08/10/13 0335 08/10/13 1700 08/11/13 0345  NA 145  < > 145  < > 141 142 139  K 4.1  < > 3.8  < > 3.9 5.0 4.1  CL 104  < > 100  < > 96 97 91*  CO2 31  < > 33*  < > 34* 33* 33*  GLUCOSE 110*  < > 101*  < > 105* 173* 111*  BUN 18  < > 22  < > 23 25* 29*  CREATININE 0.80  < > 0.94  < > 0.97 1.09 1.11*  CALCIUM 9.2  < > 9.1  < > 9.6 9.9 10.1  MG 2.4  --  2.2  --   --   --  2.5  PHOS 4.3  --  3.9  --   --   --  4.3  < > = values in this interval not displayed.  Liver Function Tests:  Recent Labs  07/31/13 1731  AST 13  ALT 9  ALKPHOS 65  BILITOT 0.6  PROT 7.1  ALBUMIN 3.4*   CBC:  Recent Labs  04/03/13 2031  08/02/13 0300  08/09/13 0406 08/10/13 0335 08/11/13 0345  WBC 18.4*  < >  --   < > 7.5 7.0 8.2  NEUTROABS 16.9*  --  7.7  --   --   --   --  HGB 11.5*  < >  --   < > 9.8* 10.3* 11.7*  HCT 36.3  < >  --   < > 32.0* 33.5* 38.8  MCV 81.0  < >  --   < > 88.9 88.9 89.0  PLT 129*  < >  --   < > 156 166 196  < > = values in this interval not displayed.  Cardiac Enzymes:  Recent Labs  08/01/13 0850  TROPONINI <0.30   CBG:  Recent Labs  08/02/13 1601 08/02/13 1933 08/04/13 0747  GLUCAP 119* 106* 96    ASSESSMENT/PLAN:  Acute on chronic respiratory failure - resolved Obstructive sleep apnea - on CPAP at night Chronic diastolic heart failure - continue Lasix Hypertension - well controlled; continue Norvasc and Lopressor CAD - continue aspirin and imdur Hyperlipidemia - continue Zetia and Crestor GERD - continue protonix Depression - continue Zoloft Asthma - stable; continue  Singulair   CPT CODE: 01751  [place] Vargas - NP [facility] [phone]

## 2013-09-04 ENCOUNTER — Encounter: Payer: Self-pay | Admitting: Pulmonary Disease

## 2013-09-04 ENCOUNTER — Ambulatory Visit (INDEPENDENT_AMBULATORY_CARE_PROVIDER_SITE_OTHER): Payer: Medicare Other | Admitting: Pulmonary Disease

## 2013-09-04 VITALS — BP 106/82 | HR 52 | Temp 97.3°F | Ht 65.0 in | Wt 201.0 lb

## 2013-09-04 DIAGNOSIS — G4733 Obstructive sleep apnea (adult) (pediatric): Secondary | ICD-10-CM

## 2013-09-04 DIAGNOSIS — J9601 Acute respiratory failure with hypoxia: Secondary | ICD-10-CM | POA: Insufficient documentation

## 2013-09-04 DIAGNOSIS — J9610 Chronic respiratory failure, unspecified whether with hypoxia or hypercapnia: Secondary | ICD-10-CM

## 2013-09-04 DIAGNOSIS — E662 Morbid (severe) obesity with alveolar hypoventilation: Secondary | ICD-10-CM

## 2013-09-04 NOTE — Progress Notes (Signed)
Subjective:    Patient ID: Laura [name], female    DOB: 07-28-1929, 80 y.o.   MRN: [id number]  HPI Is an 80 year old female who I've been asked to see for obesity hypoventilation syndrome and possibly obstructive sleep apnea. She was recently in the hospital with decompensated hypercarbia, acute on chronic respiratory failure, and congestive heart failure.  The patient was aggressively diuresed, and was discharged home on noninvasive ventilation with a Trilogy device. She has been wearing this the last 3 days with a nasal mask, and denies oral venting. She likes the device, and feels that she is more rested in the mornings upon arising, with improved daytime sleepiness. She has been on oxygen at at bedtime for the last 2 years, and continues to do so through her device. Prior to hospitalization, she was noted to have snoring, witnessed apneas, although she feels that she was rested most mornings upon arising. However, she had severe daytime sleepiness, and would doze with any period of inactivity according to the family.  She also has a long history of dyspnea on exertion, but has been progressively worse this year leading up to hospitalization. She also had a severe increase in lower extremity edema.   Sleep Questionnaire What time do you typically go to bed?( Between what hours) 11:00-11:30p 11:00-11:30p at 0926 on 09/04/13 by Randa Spike, CMA How long does it take you to fall asleep? quickly quickly at 0926 on 09/04/13 by Randa Spike, CMA How many times during the night do you wake up? 2 2 at 0926 on 09/04/13 by Randa Spike, CMA What time do you get out of bed to start your day? 0830 0830 at 0926 on 09/04/13 by Randa Spike, CMA Do you drive or operate heavy machinery in your occupation? No No at 0926 on 09/04/13 by Randa Spike, CMA How much has your weight changed (up or down) over the past two years? (In pounds) 0 oz (0 kg) 0 oz (0 kg) at 0926 on  09/04/13 by Randa Spike, CMA Have you ever had a sleep study before? Yes Yes at 0926 on 09/04/13 by Randa Spike, CMA If yes, location of study? Can't remember but it was done in [facility] remember but it was done in [place] [facility] [place] on 09/04/13 by Randa Spike, CMA If yes, date of study? about 3 years ago about 3 years ago at 0926 on 09/04/13 by Randa Spike, CMA Do you currently use CPAP? Yes Yes at 0926 on 09/04/13 by Randa Spike, CMA If so, what pressure? Pt is unsure, but knows it's the Triology Machine Pt is unsure, but knows it's the Triology Machine at 0926 on 09/04/13 by Randa Spike, CMA Do you wear oxygen at any time? No No at 0926 on 09/04/13 by Randa Spike, CMA   Review of Systems  Constitutional: Negative for fever and unexpected weight change.  HENT: Negative for congestion, dental problem, ear pain, nosebleeds, postnasal drip, rhinorrhea, sinus pressure, sneezing, sore throat and trouble swallowing.   Eyes: Negative for redness and itching.  Respiratory: Positive for shortness of breath. Negative for cough, chest tightness and wheezing.   Cardiovascular: Negative for palpitations and leg swelling.  Gastrointestinal: Negative for nausea and vomiting.  Genitourinary: Negative for dysuria.  Musculoskeletal: Negative for joint swelling.  Skin: Negative for rash.  Neurological: Negative for headaches.  Hematological: Does not bruise/bleed easily.  Psychiatric/Behavioral: Negative for dysphoric mood. The patient is not nervous/anxious.  Objective:   Physical Exam Constitutional:  Obese female, no acute distress  HENT:  Nares patent without discharge  Oropharynx without exudate, palate and uvula are normal  Eyes:  Perrla, eomi, no scleral icterus  Neck:  No JVD, no TMG  Cardiovascular:  Normal rate, regular rhythm, no rubs or gallops.  No murmurs        Intact distal pulses but diminished  Pulmonary :  Normal  breath sounds, no stridor or respiratory distress   No  rhonchi, or wheezing.  +crackles both bases.   Abdominal:  Soft, nondistended, bowel sounds present.  No tenderness noted.   Musculoskeletal:  1-2+  lower extremity edema noted.  Lymph Nodes:  No cervical lymphadenopathy noted  Skin:  No cyanosis noted  Neurologic:  Alert, appropriate, moves all 4 extremities without obvious deficit.         Assessment & Plan:

## 2013-09-04 NOTE — Assessment & Plan Note (Signed)
The patient clearly has obesity hypoventilation syndrome as evidenced by her centripetal obesity, and chronic hypercarbia documented while in the hospital. She is currently on a noninvasive positive pressure device, and feels that she is sleeping much better, and is more rested during the day. This has helped her breathing, but she still complains of significant dyspnea on exertion.

## 2013-09-04 NOTE — Assessment & Plan Note (Signed)
The patient's history is very suspicious for clinically significant sleep disordered breathing.  I think she needs a sleep study done so that we can optimize the settings on her noninvasive positive pressure device. If she does not have sleep apnea, we can focus just on her tidal volumes and minute ventilation. If she does have sleep apnea, she will need to have her expiratory pressure increased to the level of EPAP during her split-night study. I have also encouraged the patient to work aggressively on weight loss.

## 2013-09-04 NOTE — Patient Instructions (Addendum)
Stay on your noninvasive vent at night with current settings.  We will fine tune settings after sleep study Will schedule for sleep study Weigh daily as we discussed, and let your primary md know if weight changing fairly rapidly Will arrange for breathing studies to evaluate for possible copd once you are stronger.  Work on aggressive weight loss.  followup with me after your sleep study.

## 2013-09-04 NOTE — Assessment & Plan Note (Signed)
The patient's chronic respiratory failure and dyspnea on exertion are multifactorial. She has morbid obesity with deconditioning, obesity hypoventilation syndrome, pulmonary hypertension of multiple origins, and also diastolic dysfunction. It is unclear whether she has COPD, and this will need to be checked in the future when she is a little more stable after her hospitalization. Will continue her on her noninvasive positive pressure device for her OHS, and she will also need aggressive diuresis as tolerated by her primary physician. I have explained to her that her role in all of this is going to be aggressive weight loss.

## 2013-09-10 ENCOUNTER — Encounter: Payer: Self-pay | Admitting: Pulmonary Disease

## 2013-09-15 ENCOUNTER — Ambulatory Visit (INDEPENDENT_AMBULATORY_CARE_PROVIDER_SITE_OTHER): Payer: Medicare Other | Admitting: Pulmonary Disease

## 2013-09-15 ENCOUNTER — Encounter (INDEPENDENT_AMBULATORY_CARE_PROVIDER_SITE_OTHER): Payer: Self-pay

## 2013-09-15 ENCOUNTER — Other Ambulatory Visit: Payer: Self-pay | Admitting: Pulmonary Disease

## 2013-09-15 ENCOUNTER — Telehealth: Payer: Self-pay | Admitting: Pulmonary Disease

## 2013-09-15 ENCOUNTER — Encounter: Payer: Self-pay | Admitting: Pulmonary Disease

## 2013-09-15 VITALS — BP 140/80 | HR 57 | Temp 98.1°F | Ht 63.0 in | Wt 201.2 lb

## 2013-09-15 DIAGNOSIS — E662 Morbid (severe) obesity with alveolar hypoventilation: Secondary | ICD-10-CM

## 2013-09-15 DIAGNOSIS — J9610 Chronic respiratory failure, unspecified whether with hypoxia or hypercapnia: Secondary | ICD-10-CM

## 2013-09-15 DIAGNOSIS — I2510 Atherosclerotic heart disease of native coronary artery without angina pectoris: Secondary | ICD-10-CM

## 2013-09-15 NOTE — Patient Instructions (Addendum)
Will make changes to trilogy to try and increase air movement. Will see if we can get your sleep study date moved up. Will schedule for breathing studies. Increase your oxygen to 4 liters pulsed with exertional activities if your machine goes to that level.  Will see you back once your sleep study is done.

## 2013-09-15 NOTE — Assessment & Plan Note (Signed)
The patient tells me that she has been compliant with her Trilogy device, but feels that she is not getting enough pressure. Her family has noticed increased lethargy and hypersomnia the last few days, and is concerned about worsening hypercarbia. This is certainly a possibility, and I will make some changes to her minute ventilation. The patient had significant desaturation today walking in from the waiting room, and we will need to increase her liter flow. There is concerned about the possibility of superimposed COPD, and she will need to have PFTs for diagnosis. She also needs to have a sleep study to evaluate for obstructive sleep apnea.

## 2013-09-15 NOTE — Progress Notes (Signed)
   Subjective:    Patient ID: Laura [name], female    DOB: 1930/04/06, 80 y.o.   MRN: [id number]  HPI The patient comes in today for followup of her chronic respiratory failure secondary to obesity hypoventilation syndrome. She may also have underlying obstructive lung disease, but has yet to have pulmonary function studies. She is also scheduled for a sleep study. The patient states she had been doing well with her Trilogy noninvasive ventilator, but the last few days has felt increased sleepiness during the day. Her family has seen her dozing at rest more than she has recently. There is concern about the possibility of her carbon dioxide level rising again. The patient makes the comment that she feels she is not getting enough air through her noninvasive device.   Review of Systems  Constitutional: Positive for fatigue. Negative for fever and unexpected weight change.  HENT: Negative for congestion, dental problem, ear pain, nosebleeds, postnasal drip, rhinorrhea, sinus pressure, sneezing, sore throat and trouble swallowing.   Eyes: Negative for redness and itching.  Respiratory: Positive for shortness of breath. Negative for cough, chest tightness and wheezing.   Cardiovascular: Negative for palpitations and leg swelling.  Gastrointestinal: Negative for nausea and vomiting.  Genitourinary: Negative for dysuria.  Musculoskeletal: Negative for joint swelling.  Skin: Negative for rash.  Neurological: Negative for headaches.  Hematological: Does not bruise/bleed easily.  Psychiatric/Behavioral: Negative for dysphoric mood. The patient is not nervous/anxious.        Objective:   Physical Exam Obese female in no acute distress Nose without purulence or discharge noted No skin breakdown or pressure necrosis from the CPAP mask Neck without lymphadenopathy or thyromegaly Chest with decreased breath sounds, but no wheezes or crackles Cardiac exam with slight irregularity, Lower  extremities with mild edema, no cyanosis Alert and oriented, moves all 4 extremities.       Assessment & Plan:

## 2013-09-15 NOTE — Telephone Encounter (Signed)
[place] spoke with APS. Will send to him for documentation.

## 2013-09-16 ENCOUNTER — Ambulatory Visit (INDEPENDENT_AMBULATORY_CARE_PROVIDER_SITE_OTHER): Payer: Medicare Other | Admitting: Pulmonary Disease

## 2013-09-16 DIAGNOSIS — E662 Morbid (severe) obesity with alveolar hypoventilation: Secondary | ICD-10-CM

## 2013-09-16 NOTE — Progress Notes (Signed)
PFT done today. 

## 2013-09-17 ENCOUNTER — Telehealth: Payer: Self-pay | Admitting: Pulmonary Disease

## 2013-09-17 ENCOUNTER — Ambulatory Visit (HOSPITAL_BASED_OUTPATIENT_CLINIC_OR_DEPARTMENT_OTHER): Payer: Medicare Other | Attending: Pulmonary Disease | Admitting: Radiology

## 2013-09-17 VITALS — Ht 62.0 in | Wt 201.0 lb

## 2013-09-17 DIAGNOSIS — G4733 Obstructive sleep apnea (adult) (pediatric): Secondary | ICD-10-CM

## 2013-09-17 DIAGNOSIS — G4730 Sleep apnea, unspecified: Principal | ICD-10-CM

## 2013-09-17 DIAGNOSIS — G4710 Hypersomnia, unspecified: Secondary | ICD-10-CM | POA: Insufficient documentation

## 2013-09-17 DIAGNOSIS — J9620 Acute and chronic respiratory failure, unspecified whether with hypoxia or hypercapnia: Secondary | ICD-10-CM

## 2013-09-17 LAB — PULMONARY FUNCTION TEST
DL/VA % PRED: 67 %
DL/VA: 3.08 ml/min/mmHg/L
DLCO UNC: 9.56 ml/min/mmHg
DLCO unc % pred: 44 %
FEF 25-75 Post: 0.27 L/sec
FEF 25-75 Pre: 0.4 L/sec
FEF2575-%Change-Post: -32 %
FEF2575-%PRED-PRE: 32 %
FEF2575-%Pred-Post: 22 %
FEV1-%Change-Post: -6 %
FEV1-%Pred-Post: 42 %
FEV1-%Pred-Pre: 45 %
FEV1-Post: 0.72 L
FEV1-Pre: 0.77 L
FEV1FVC-%Change-Post: 10 %
FEV1FVC-%Pred-Pre: 91 %
FEV6-%CHANGE-POST: -10 %
FEV6-%PRED-POST: 44 %
FEV6-%PRED-PRE: 50 %
FEV6-POST: 0.97 L
FEV6-PRE: 1.09 L
FEV6FVC-%Change-Post: 4 %
FEV6FVC-%PRED-POST: 106 %
FEV6FVC-%Pred-Pre: 101 %
FVC-%Change-Post: -15 %
FVC-%PRED-POST: 42 %
FVC-%PRED-PRE: 49 %
FVC-POST: 0.97 L
FVC-PRE: 1.15 L
POST FEV6/FVC RATIO: 100 %
Post FEV1/FVC ratio: 74 %
Pre FEV1/FVC ratio: 67 %
Pre FEV6/FVC Ratio: 95 %

## 2013-09-17 NOTE — Telephone Encounter (Signed)
Called and spoke with pts daughter and she is requesting to have the pts blood gas checked tomorrow after her sleep study is done. pts daughter stated that she went over to check on the pt and she was sound asleep sitting at the table.   pts daughter is aware that [facility] is out of the office this afternoon.  [place] please advise. Thanks  Allergies  Allergen Reactions  . Codeine Nausea And Vomiting  . Hydrocodone Other (See Comments)    delusions  . Morphine And Related Other (See Comments)    Extremely cofused    Current Outpatient Prescriptions on File Prior to Visit  Medication Sig Dispense Refill  . acetaminophen (TYLENOL) 500 MG tablet Take 500 mg by mouth every 6 (six) hours as needed for mild pain.      Marland Kitchen albuterol (PROVENTIL HFA;VENTOLIN HFA) 108 (90 BASE) MCG/ACT inhaler Inhale 2 puffs into the lungs every 4 (four) hours as needed. For cough or wheezing      . allopurinol (ZYLOPRIM) 100 MG tablet Take 100 mg by mouth daily as needed (gout).      Marland Kitchen amLODipine (NORVASC) 5 MG tablet Take 1 tablet (5 mg total) by mouth daily.      Marland Kitchen aspirin 81 MG tablet Take 81 mg by mouth at bedtime.       Marland Kitchen atorvastatin (LIPITOR) 80 MG tablet Take 80 mg by mouth daily with supper. For hyperlipdemia      . ezetimibe (ZETIA) 10 MG tablet Take 10 mg by mouth daily. For cholesterol      . furosemide (LASIX) 20 MG tablet Take 20 mg by mouth daily. For CHF      . isosorbide mononitrate (IMDUR) 30 MG 24 hr tablet Take 30 mg by mouth daily. For CAD      . metoprolol tartrate (LOPRESSOR) 25 MG tablet Take 25 mg by mouth 2 (two) times daily. For HTN      . montelukast (SINGULAIR) 10 MG tablet Take 10 mg by mouth every morning. For COPD/Asthma      . nitroGLYCERIN (NITROSTAT) 0.4 MG SL tablet Place 0.4 mg under the tongue every 5 (five) minutes as needed for chest pain.      . NONFORMULARY OR COMPOUNDED ITEM -Non-invasive settings:   -Humidified mask of choice   -bleed in 2 liters Oxygen   -PS 10/peep5   -defer  decision about titration study for out-pt after compliance w/ NIPPV verified      . ondansetron (ZOFRAN-ODT) 8 MG disintegrating tablet Take 8 mg by mouth every 8 (eight) hours as needed for nausea.       . pantoprazole (PROTONIX) 40 MG tablet Take 40 mg by mouth 2 (two) times daily before a meal. For GERD      . potassium chloride (K-DUR,KLOR-CON) 10 MEQ tablet Take 10 mEq by mouth daily. Potassium supplement      . sertraline (ZOLOFT) 50 MG tablet Take 50 mg by mouth daily with breakfast. For depression      . tamsulosin (FLOMAX) 0.4 MG CAPS capsule Take 0.4 mg by mouth at bedtime.      . Vitamin D, Ergocalciferol, (DRISDOL) 50000 UNITS CAPS capsule Take 50,000 Units by mouth every 7 (seven) days. For Vitamin D       No current facility-administered medications on file prior to visit.

## 2013-09-17 NOTE — Telephone Encounter (Signed)
Called and spoke with pts daughter and she is aware that [facility] is ok for the pt to have the blood gas done.  This has been ordered for the pt and the daughter is aware that the pt will need to go to admitting to register for the blood gas to be done.  Nothing further is needed

## 2013-09-17 NOTE — Telephone Encounter (Signed)
[place] with me.  Will need to be setup with [place] [facility] de[facility].  Do on pt's usual oxygen if she is even wearing.

## 2013-09-18 ENCOUNTER — Ambulatory Visit (HOSPITAL_COMMUNITY)
Admission: RE | Admit: 2013-09-18 | Discharge: 2013-09-18 | Disposition: A | Discharge status: Home / Self Care | Payer: Medicare Other | Source: Ambulatory Visit | Attending: Pulmonary Disease | Admitting: Pulmonary Disease

## 2013-09-18 DIAGNOSIS — J9610 Chronic respiratory failure, unspecified whether with hypoxia or hypercapnia: Secondary | ICD-10-CM | POA: Insufficient documentation

## 2013-09-18 LAB — BLOOD GAS, ARTERIAL
ACID-BASE EXCESS: 2.2 mmol/L — AB (ref 0.0–2.0)
Bicarbonate: 27.7 mEq/L — ABNORMAL HIGH (ref 20.0–24.0)
O2 CONTENT: 2 L/min
O2 Saturation: 89.7 %
Patient temperature: 98.6
TCO2: 26.4 mmol/L (ref 0–100)
pCO2 arterial: 51.1 mmHg — ABNORMAL HIGH (ref 35.0–45.0)
pH, Arterial: 7.354 (ref 7.350–7.450)
pO2, Arterial: 62.4 mmHg — ABNORMAL LOW (ref 80.0–100.0)

## 2013-10-02 ENCOUNTER — Telehealth: Payer: Self-pay | Admitting: Pulmonary Disease

## 2013-10-02 DIAGNOSIS — G4730 Sleep apnea, unspecified: Secondary | ICD-10-CM

## 2013-10-02 DIAGNOSIS — G4710 Hypersomnia, unspecified: Secondary | ICD-10-CM

## 2013-10-02 NOTE — Telephone Encounter (Signed)
Pt needs ov to review sleep study.  Thanks.  

## 2013-10-02 NOTE — Sleep Study (Signed)
   NAME: Laura [name] DATE OF BIRTH:  1929-07-05 MEDICAL RECORD NUMBER [id number]  LOCATION: [facility]  PHYSICIAN: [place] OF STUDY: 09/17/2013  SLEEP STUDY TYPE: Nocturnal Polysomnogram               REFERRING PHYSICIAN: Clance, Armando Reichert, MD  INDICATION FOR STUDY: Hypersomnia with sleep apnea  EPWORTH SLEEPINESS SCORE:  15 HEIGHT: 5\' 2"  (157.5 cm)  WEIGHT: 201 lb (91.173 kg)    Body mass index is 36.75 kg/(m^2).  NECK SIZE: 15.5 in.  MEDICATIONS: Reviewed in the sleep record  SLEEP ARCHITECTURE: The patient had a total sleep time of 284 minutes with no slow-wave sleep or REM achieved. Sleep onset latency was normal at 11 minutes, and sleep efficiency was moderately reduced at 78%.  RESPIRATORY DATA: The patient was found to have 2 obstructive apneas and no obstructive hypopneas, giving her an AHI of only 0.4 of events per hour. The patient slept upright during the entire night, and never achieved REM. Loud snoring was noted throughout.  OXYGEN DATA: The patient had oxygen desaturation only as low as 95% during the night, but was on 2 L of nasal cannula as she wears at home.  CARDIAC DATA: PACs and PVCs were noted.  MOVEMENT/PARASOMNIA: No significant limb movements or abnormal behaviors were seen.  IMPRESSION/ RECOMMENDATION:    1) small numbers of obstructive events which do not meet the AHI criteria for the obstructive sleep apnea syndrome. However, the patient slept upright throughout the whole night, and never achieved REM. Her sleeping position at home needs to be taken into consideration when making decisions about treatment.  2) PACs and PVCs were noted, but no clinically significant arrhythmias were seen.     [place], American Board of Sleep Medicine  ELECTRONICALLY SIGNED ON:  10/02/2013, 6:01 PM [place] PH: [phone]   FX: [phone] [place]

## 2013-10-05 ENCOUNTER — Encounter: Payer: Self-pay | Admitting: Vascular Surgery

## 2013-10-05 NOTE — Telephone Encounter (Signed)
ATC, NA, no voicemail. [place] Bing, CMA

## 2013-10-06 ENCOUNTER — Other Ambulatory Visit: Payer: Medicare Other

## 2013-10-06 ENCOUNTER — Ambulatory Visit: Payer: Medicare Other | Admitting: Vascular Surgery

## 2013-10-06 NOTE — Telephone Encounter (Signed)
Appt set for 10-27-13, first available for the pt. [place] Bing, CMA

## 2013-10-22 ENCOUNTER — Encounter (HOSPITAL_BASED_OUTPATIENT_CLINIC_OR_DEPARTMENT_OTHER): Payer: Medicare Other

## 2013-10-27 ENCOUNTER — Encounter: Payer: Self-pay | Admitting: Pulmonary Disease

## 2013-10-27 ENCOUNTER — Ambulatory Visit (INDEPENDENT_AMBULATORY_CARE_PROVIDER_SITE_OTHER): Payer: Medicare Other | Admitting: Pulmonary Disease

## 2013-10-27 VITALS — BP 110/70 | HR 57 | Temp 96.9°F | Ht 63.0 in | Wt 191.6 lb

## 2013-10-27 DIAGNOSIS — J438 Other emphysema: Secondary | ICD-10-CM

## 2013-10-27 DIAGNOSIS — I2510 Atherosclerotic heart disease of native coronary artery without angina pectoris: Secondary | ICD-10-CM

## 2013-10-27 DIAGNOSIS — E662 Morbid (severe) obesity with alveolar hypoventilation: Secondary | ICD-10-CM

## 2013-10-27 DIAGNOSIS — J9612 Chronic respiratory failure with hypercapnia: Secondary | ICD-10-CM

## 2013-10-27 DIAGNOSIS — J9610 Chronic respiratory failure, unspecified whether with hypoxia or hypercapnia: Secondary | ICD-10-CM

## 2013-10-27 DIAGNOSIS — J439 Emphysema, unspecified: Secondary | ICD-10-CM

## 2013-10-27 NOTE — Assessment & Plan Note (Signed)
The patient's PFTs showed minimal COPD, and therefore I would like to hold off on starting maintenance medications. This is obviously not the cause of her hypercarbic respiratory failure.

## 2013-10-27 NOTE — Patient Instructions (Addendum)
Will check oxygen level overnight ON your ventilator. Will call you with results.  Can use albuterol if needed, but you have minimal COPD by your breathing studies Keep working on weight loss, you are doing great. followup with me again in 44mos., but call if not doing well.

## 2013-10-27 NOTE — Assessment & Plan Note (Signed)
The patient is doing very well on her noninvasive positive pressure device, with significant improvement in her PCO2. I have asked her to continue on this, and will check overnight oximetry off oxygen. Again, the main treatment for this is aggressive weight loss, and the patient is working on this.

## 2013-10-27 NOTE — Progress Notes (Signed)
   Subjective:    Patient ID: Laura [name], female    DOB: 16-Sep-1929, 80 y.o.   MRN: [id number]  HPI The patient comes in today for followup of her chronic respiratory failure secondary to obesity hypoventilation syndrome. She has been on a noninvasive positive pressure device, and her most recent blood gas shows that her PCO2 is down to 51. She has had pulmonary function studies that showed minimal airflow obstruction, but definite restriction. She has had a sleep study which showed no clinically significant sleep apnea, but the patient was sleeping upright as she does at home in her recliner, and did not achieve REM. The patient feels that she is doing extremely well, and has lost weight.   Review of Systems  Constitutional: Negative for fever and unexpected weight change.  HENT: Negative for congestion, dental problem, ear pain, nosebleeds, postnasal drip, rhinorrhea, sinus pressure, sneezing, sore throat and trouble swallowing.   Eyes: Negative for redness and itching.  Respiratory: Negative for cough, chest tightness, shortness of breath and wheezing.   Cardiovascular: Negative for palpitations and leg swelling.  Gastrointestinal: Negative for nausea and vomiting.  Genitourinary: Negative for dysuria.  Musculoskeletal: Negative for joint swelling.  Skin: Negative for rash.  Neurological: Negative for headaches.  Hematological: Does not bruise/bleed easily.  Psychiatric/Behavioral: Negative for dysphoric mood. The patient is not nervous/anxious.        Objective:   Physical Exam Overweight female in no acute distress Nose without purulence or discharge noted Neck without lymphadenopathy or thyromegaly Chest totally clear to auscultation, no wheezing Cardiac exam with regular rate and rhythm Lower extremities with minimal edema and varicosities Alert and oriented, moves all 4 extremities.       Assessment & Plan:

## 2013-11-02 ENCOUNTER — Telehealth: Payer: Self-pay | Admitting: Pulmonary Disease

## 2013-11-02 NOTE — Telephone Encounter (Signed)
Order was faxed to [facility] 10/27/13. Called [facility] and spoke with Maudie Mercury.  They are going to call pt today. Called pt and made her aware. Nothing further needed

## 2013-11-10 ENCOUNTER — Other Ambulatory Visit: Payer: Medicare Other

## 2013-11-10 ENCOUNTER — Ambulatory Visit: Payer: Medicare Other | Admitting: Vascular Surgery

## 2013-11-11 ENCOUNTER — Telehealth: Payer: Self-pay | Admitting: Pulmonary Disease

## 2013-11-11 DIAGNOSIS — E662 Morbid (severe) obesity with alveolar hypoventilation: Secondary | ICD-10-CM

## 2013-11-11 NOTE — Telephone Encounter (Signed)
I discussed her sleep study results with her during visit 10/27/13!

## 2013-11-11 NOTE — Telephone Encounter (Signed)
Called and spoke to pt. Pt is wanting sleep study results.   [place] please advise.

## 2013-11-12 NOTE — Telephone Encounter (Signed)
Spoke with pt husband--States they are not wanting to the results of the sleep study, they have already reviewed these with Dr Gwenette Greet; they are wanting results of ONO that was completed on room air with Trilogy Vent.  Also, the patient husband is wanting to know if an order can be sent to pick up O2 tanks, they have not been using them and it is costing them almost $300.00/mo to store these in their home.   Pt husband very frustrated with not hearing results yet--apologized for the delay in results, advised we will get these to them ASAP.   Please advise Anderson Malta if you or Dr Gwenette Greet have these results? Thanks.

## 2013-11-12 NOTE — Telephone Encounter (Signed)
Pt is requesting ONO results. These have been placed in [place] green folder. [place] Bing, CMA

## 2013-11-12 NOTE — Telephone Encounter (Signed)
Pt returned call. Informed pt of results and recs per [facility]. Pt states she has not used her O2 concentrator in 4 months. Order placed to have [facility] resume O2 with her trilogy. Pt aware that someone will call her in regards to O2. Nothing further needed.

## 2013-11-12 NOTE — Telephone Encounter (Signed)
Let them know that her oxygen level fell to 82%, and spent over 2 hours less than 89% during the night Needs to stay on oxygen at 2 liters thru her trilogy. I have no control over the large tanks.  Those are backup in the event her concentrator quits working or if the power goes out.  It may be a law the DME provides this for the pt.

## 2013-11-12 NOTE — Telephone Encounter (Signed)
lmtcb

## 2013-11-16 ENCOUNTER — Encounter: Payer: Self-pay | Admitting: Pulmonary Disease

## 2013-12-01 ENCOUNTER — Encounter: Payer: Self-pay | Admitting: Pulmonary Disease

## 2014-01-22 ENCOUNTER — Other Ambulatory Visit: Payer: Self-pay

## 2014-04-20 ENCOUNTER — Encounter: Payer: Self-pay | Admitting: Gastroenterology

## 2014-04-26 ENCOUNTER — Ambulatory Visit (INDEPENDENT_AMBULATORY_CARE_PROVIDER_SITE_OTHER): Payer: Medicare Other | Admitting: Gastroenterology

## 2014-04-26 ENCOUNTER — Encounter: Payer: Self-pay | Admitting: Gastroenterology

## 2014-04-26 VITALS — BP 138/80 | HR 47 | Ht 64.0 in | Wt 198.0 lb

## 2014-04-26 DIAGNOSIS — D509 Iron deficiency anemia, unspecified: Secondary | ICD-10-CM

## 2014-04-26 MED ORDER — MOVIPREP 100 G PO SOLR
1.0000 | Freq: Once | ORAL | Status: DC
Start: 2014-04-26 — End: 2014-06-28

## 2014-04-26 NOTE — Progress Notes (Addendum)
04/26/2014 Laura [name] [id number] 08-27-1929   HISTORY OF PRESENT ILLNESS:  This is an 81 year old female who is new to our practice.  She is here today with her husband and her daughter to discuss colonoscopy for evaluation of iron deficiency anemia per the request of her PCP, Dr. Joylene Draft.  She has been at least mildly anemic for at the past year with her Hgb ranging in the 9-11.5 gram range.  Most recent Hgb earlier this month was 10.5 grams with a low MCV of 70.6.  Ferritin was low-normal at 28, iron saturation low at 7%, serum iron low at 32, and TIBC borderline high at 451.  She has never undergone colonoscopy in the past.  She denies any GI issues including dark or bloody stools.  She was started on iron supplements by her PCP last week.   She was also sent home with hemoccult studies but has not yet performed those.  She does have an extensive PMH including CAD with stent placement, PVD, CHF, and some pulmonary issues for which she was previously on home O2 until 08/2013 when it was discovered that she was being over-oxygenated.  She now only requires a breath machine (similar to CPAP according to her daughter) at bedtime and has been "the best that she has been in 5 years" per their report.     Past Medical History  Diagnosis Date  . Coronary artery disease   . Myocardial infarction   . Arthritis   . Asthma   . Chronic bronchitis   . Urinary tract infection   . Kidney stone   . Hydronephrosis   . Complication of anesthesia   . PONV (postoperative nausea and vomiting)   . Dysrhythmia   . Peripheral vascular disease   . CHF (congestive heart failure)   . Sleep apnea   . Shortness of breath   . Depression   . Hypernatremia     resolved  . Anemia   . Thrombocytopenia     resolved  . Subclinical hypothyroidism   . Thoracic aortic aneurysm   . Obesity hypoventilation syndrome   . Hypertension     pulmonary hypotension secondary to chronic heart/lung disease  . Acute  encephalopathy     resolved  . Transient hypotension     resolved with fluids  . COPD (chronic obstructive pulmonary disease)   . Nephrolithiasis    Past Surgical History  Procedure Laterality Date  . Coronary stent placement    . Appendectomy    . Tonsillectomy    . Knee surgery    . Cystoscopy w/ ureteral stent placement Right 04/04/2013    Procedure: CYSTOSCOPY WITH RETROGRADE PYELOGRAM/URETERAL STENT PLACEMENT;  Surgeon: Molli Hazard, MD;  Location: [facility];  Service: Urology;  Laterality: Right;  . Cystoscopy with retrograde pyelogram, ureteroscopy and stent placement Right 04/30/2013    Procedure: CYSTOSCOPY WITH RETROGRADE PYELOGRAM, URETEROSCOPY AND STENT EXCHANGE;  Surgeon: Molli Hazard, MD;  Location: [facility];  Service: Urology;  Laterality: Right;  . Holmium laser application Right 4/65/0354    Procedure: HOLMIUM LASER APPLICATION;  Surgeon: Molli Hazard, MD;  Location: [facility];  Service: Urology;  Laterality: Right;    reports that she has never smoked. She has never used smokeless tobacco. She reports that she does not drink alcohol or use illicit drugs. family history is not on file. She was adopted. Allergies  Allergen Reactions  . Codeine Nausea And Vomiting  . Hydrocodone Other (  See Comments)    delusions  . Morphine And Related Other (See Comments)    Extremely cofused      Outpatient Encounter Prescriptions as of 04/26/2014  Medication Sig  . acetaminophen (TYLENOL) 500 MG tablet Take 500 mg by mouth every 6 (six) hours as needed for mild pain.  Marland Kitchen albuterol (PROVENTIL HFA;VENTOLIN HFA) 108 (90 BASE) MCG/ACT inhaler Inhale 2 puffs into the lungs every 4 (four) hours as needed. For cough or wheezing  . allopurinol (ZYLOPRIM) 100 MG tablet Take 100 mg by mouth daily as needed (gout).  Marland Kitchen amLODipine (NORVASC) 5 MG tablet Take 1 tablet (5 mg total) by mouth daily.  Marland Kitchen aspirin 81 MG tablet Take 81 mg by mouth at bedtime.   Marland Kitchen atorvastatin  (LIPITOR) 80 MG tablet Take 80 mg by mouth daily with supper. For hyperlipdemia  . ezetimibe (ZETIA) 10 MG tablet Take 10 mg by mouth daily. For cholesterol  . furosemide (LASIX) 20 MG tablet Take 20 mg by mouth daily. For CHF  . isosorbide mononitrate (IMDUR) 30 MG 24 hr tablet Take 30 mg by mouth daily. For CAD  . metoprolol tartrate (LOPRESSOR) 25 MG tablet Take 25 mg by mouth 2 (two) times daily. For HTN  . montelukast (SINGULAIR) 10 MG tablet Take 10 mg by mouth every morning. For COPD/Asthma  . nitroGLYCERIN (NITROSTAT) 0.4 MG SL tablet Place 0.4 mg under the tongue every 5 (five) minutes as needed for chest pain.  . NONFORMULARY OR COMPOUNDED ITEM -Non-invasive settings:   -Humidified mask of choice   -bleed in 2 liters Oxygen   -PS 10/peep5   -defer decision about titration study for out-pt after compliance w/ NIPPV verified  . ondansetron (ZOFRAN-ODT) 8 MG disintegrating tablet Take 8 mg by mouth every 8 (eight) hours as needed for nausea.   . pantoprazole (PROTONIX) 40 MG tablet Take 40 mg by mouth 2 (two) times daily before a meal. For GERD  . potassium chloride (K-DUR,KLOR-CON) 10 MEQ tablet Take 10 mEq by mouth daily. Potassium supplement  . sertraline (ZOLOFT) 50 MG tablet Take 50 mg by mouth daily with breakfast. For depression  . tamsulosin (FLOMAX) 0.4 MG CAPS capsule Take 0.4 mg by mouth at bedtime.  . Vitamin D, Ergocalciferol, (DRISDOL) 50000 UNITS CAPS capsule Take 50,000 Units by mouth every 7 (seven) days. For Vitamin D  . MOVIPREP 100 G SOLR Take 1 kit (200 g total) by mouth once.     REVIEW OF SYSTEMS  : All other systems reviewed and negative except where noted in the History of Present Illness.   PHYSICAL EXAM: BP 138/80 mmHg  Pulse 47  Ht 5' 4"  (1.626 m)  Wt 198 lb (89.812 kg)  BMI 33.97 kg/m2  SpO2 97% General: Well developed white female in no acute distress Head: Normocephalic and atraumatic Eyes:  Sclerae anicteric, conjunctiva pink. Ears: Normal  auditory acuity Lungs: Clear throughout to auscultation Heart: Bradycardic. Abdomen: Soft, non-distended.  Normal bowel sounds.  Non-tender. Rectal:  Will be done at the time of colonoscopy. Musculoskeletal: Symmetrical with no gross deformities  Skin: No lesions on visible extremities Extremities: No edema  Neurological: Alert oriented x 4, grossly non-focal Psychological:  Alert and cooperative. Normal mood and affect  ASSESSMENT AND PLAN: -Iron deficiency anemia:  Stable as compared to Hgb over the past year.  Has not yet performed hemoccult studies.  Started on iron supplements last week.  No GI symptoms.  Has never undergone colonoscopy.  Will schedule colonoscopy and EGD  with Dr. Hilarie Fredrickson.  The risks, benefits, and alternatives were discussed with the patient and she consents to proceed.  Continue iron supplements and monitoring of her Hgb per her PCP.  Perform hemoccult cards and return to PCP as per their request.   Addendum: Reviewed and agree with initial management. Jerene Bears, MD

## 2014-04-26 NOTE — Patient Instructions (Signed)

## 2014-04-27 ENCOUNTER — Other Ambulatory Visit (HOSPITAL_COMMUNITY): Payer: Self-pay | Admitting: Internal Medicine

## 2014-04-27 ENCOUNTER — Encounter (HOSPITAL_COMMUNITY): Admission: RE | Admit: 2014-04-27 | Payer: Medicare Other | Source: Ambulatory Visit

## 2014-04-27 ENCOUNTER — Ambulatory Visit: Payer: Medicare Other | Admitting: Gastroenterology

## 2014-05-04 ENCOUNTER — Ambulatory Visit: Payer: Medicare Other | Admitting: Pulmonary Disease

## 2014-05-12 ENCOUNTER — Ambulatory Visit: Payer: Medicare Other | Admitting: Gastroenterology

## 2014-05-19 ENCOUNTER — Encounter: Payer: Self-pay | Admitting: Gastroenterology

## 2014-05-19 ENCOUNTER — Ambulatory Visit (INDEPENDENT_AMBULATORY_CARE_PROVIDER_SITE_OTHER): Payer: Self-pay | Admitting: Gastroenterology

## 2014-05-19 VITALS — BP 116/74 | HR 64 | Ht 63.0 in | Wt 203.6 lb

## 2014-05-19 DIAGNOSIS — D509 Iron deficiency anemia, unspecified: Secondary | ICD-10-CM

## 2014-05-19 NOTE — Progress Notes (Signed)
This 81 year old female was just recently seen by myself on 05/06/2014 for IDA and was scheduled for and EGD and colonoscopy for evaluation.  She called to reschedule her procedures, which were pushed out past the 60 day window so she needed to come back in for new/updated paperwork.  There are no new issues to discuss and this patient should have just been scheduled with a pre-visit nurse.

## 2014-05-19 NOTE — Patient Instructions (Signed)

## 2014-05-27 ENCOUNTER — Encounter: Payer: Medicare Other | Admitting: Internal Medicine

## 2014-05-31 ENCOUNTER — Encounter: Payer: Medicare Other | Admitting: Internal Medicine

## 2014-06-28 ENCOUNTER — Encounter: Payer: Self-pay | Admitting: Internal Medicine

## 2014-06-28 ENCOUNTER — Ambulatory Visit (AMBULATORY_SURGERY_CENTER): Payer: Medicare Other | Admitting: Internal Medicine

## 2014-06-28 VITALS — BP 136/65 | HR 62 | Temp 97.6°F | Resp 17 | Ht 63.0 in | Wt 203.0 lb

## 2014-06-28 DIAGNOSIS — D509 Iron deficiency anemia, unspecified: Secondary | ICD-10-CM

## 2014-06-28 DIAGNOSIS — D123 Benign neoplasm of transverse colon: Secondary | ICD-10-CM

## 2014-06-28 DIAGNOSIS — D120 Benign neoplasm of cecum: Secondary | ICD-10-CM

## 2014-06-28 MED ORDER — SODIUM CHLORIDE 0.9 % IV SOLN: 500.0000 mL | INTRAVENOUS | Status: DC

## 2014-06-28 NOTE — Progress Notes (Signed)
Called to room to assist during endoscopic procedure.  Patient ID and intended procedure confirmed with present staff. Received instructions for my participation in the procedure from the performing physician.  

## 2014-06-28 NOTE — Patient Instructions (Signed)
Discharge instructions given. Handouts on polyps,diverticulosis,hemorrhoids and a hiatal hernia. Resume previous medications. YOU HAD AN ENDOSCOPIC PROCEDURE TODAY AT [facility]:   Refer to the procedure report that was given to you for any specific questions about what was found during the examination.  If the procedure report does not answer your questions, please call your gastroenterologist to clarify.  If you requested that your care partner not be given the details of your procedure findings, then the procedure report has been included in a sealed envelope for you to review at your convenience later.  YOU SHOULD EXPECT: Some feelings of bloating in the abdomen. Passage of more gas than usual.  Walking can help get rid of the air that was put into your GI tract during the procedure and reduce the bloating. If you had a lower endoscopy (such as a colonoscopy or flexible sigmoidoscopy) you may notice spotting of blood in your stool or on the toilet paper. If you underwent a bowel prep for your procedure, you may not have a normal bowel movement for a few days.  Please Note:  You might notice some irritation and congestion in your nose or some drainage.  This is from the oxygen used during your procedure.  There is no need for concern and it should clear up in a day or so.  SYMPTOMS TO REPORT IMMEDIATELY:   Following lower endoscopy (colonoscopy or flexible sigmoidoscopy):  Excessive amounts of blood in the stool  Significant tenderness or worsening of abdominal pains  Swelling of the abdomen that is new, acute  Fever of 100F or higher   Following upper endoscopy (EGD)  Vomiting of blood or coffee ground material  New chest pain or pain under the shoulder blades  Painful or persistently difficult swallowing  New shortness of breath  Fever of 100F or higher  Black, tarry-looking stools  For urgent or emergent issues, a gastroenterologist can be reached at any hour by  calling [phone].   DIET: Your first meal following the procedure should be a small meal and then it is ok to progress to your normal diet. Heavy or fried foods are harder to digest and may make you feel nauseous or bloated.  Likewise, meals heavy in dairy and vegetables can increase bloating.  Drink plenty of fluids but you should avoid alcoholic beverages for 24 hours.  ACTIVITY:  You should plan to take it easy for the rest of today and you should NOT DRIVE or use heavy machinery until tomorrow (because of the sedation medicines used during the test).    FOLLOW UP: Our staff will call the number listed on your records the next business day following your procedure to check on you and address any questions or concerns that you may have regarding the information given to you following your procedure. If we do not reach you, we will leave a message.  However, if you are feeling well and you are not experiencing any problems, there is no need to return our call.  We will assume that you have returned to your regular daily activities without incident.  If any biopsies were taken you will be contacted by phone or by letter within the next 1-3 weeks.  Please call us at [phone] if you have not heard about the biopsies in 3 weeks.    SIGNATURES/CONFIDENTIALITY: You and/or your care partner have signed paperwork which will be entered into your electronic medical record.  These signatures attest to the fact  that that the information above on your After Visit Summary has been reviewed and is understood.  Full responsibility of the confidentiality of this discharge information lies with you and/or your care-partner.

## 2014-06-28 NOTE — Op Note (Signed)
[place]  [address] [place], [phone]   ENDOSCOPY PROCEDURE REPORT  PATIENT: Nadalie, Laughner  MR#: [id number] BIRTHDATE: [birth year] , 84  yrs. old GENDER: female ENDOSCOPIST: Jerene Bears, MD REFERRED BY:  Crist Infante, M.D. PROCEDURE DATE:  06/28/2014 PROCEDURE:  EGD w/ biopsy ASA CLASS:     Class III INDICATIONS:  iron deficiency anemia. MEDICATIONS: Monitored anesthesia care, Residual sedation present, and Propofol 50 mg IV TOPICAL ANESTHETIC: none  DESCRIPTION OF PROCEDURE: After the risks benefits and alternatives of the procedure were thoroughly explained, informed consent was obtained.  The LB [id number] endoscope was introduced through the mouth and advanced to the second portion of the duodenum , Without limitations.  The instrument was slowly withdrawn as the mucosa was fully examined.   ESOPHAGUS: The mucosa of the esophagus appeared normal.  Partial nonobstructing Schatzki's ring  STOMACH: A 3 cm hiatal hernia was noted.   The mucosa of the stomach appeared normal.  DUODENUM: The duodenal mucosa showed no abnormalities in the bulb and 2nd part of the duodenum.  Cold forceps biopsies were taken in the bulb and second portion to exclude celiac disease.  Retroflexed views revealed a hiatal hernia.     The scope was then withdrawn from the patient and the procedure completed.  COMPLICATIONS: There were no immediate complications.  ENDOSCOPIC IMPRESSION: 1.   The mucosa of the esophagus appeared normal.  Partial nonobstructing Schatzki's ring 2.   3 cm hiatal hernia 3.   The mucosa of the stomach appeared normal 4.   The duodenal mucosa showed no abnormalities in the bulb and 2nd part of the duodenum; biopsies taken  RECOMMENDATIONS: 1.  Await biopsy results 2.  Continue iron supplementation.  If iron deficiency anemia worsens, fails to respond replacement, or GI bleeding becomes overt, would proceed to video capsule  endoscopy   eSigned:  Jerene Bears, MD 06/28/2014 3:00 PM  XL:KGMW Joylene Draft, MD and The Patient

## 2014-06-28 NOTE — Op Note (Signed)
[place]  [address] [place], [phone]   COLONOSCOPY PROCEDURE REPORT  PATIENT: Naliah, Eddington  MR#: [id number] BIRTHDATE: 11-03-1929 , 84  yrs. old GENDER: female ENDOSCOPIST: Jerene Bears, MD REFERRED AJ:OINO Perini, M.D. PROCEDURE DATE:  06/28/2014 PROCEDURE:   Colonoscopy with snare polypectomy and Colonoscopy, diagnostic First Screening Colonoscopy - Avg.  risk and is 50 yrs.  old or older - No.  Prior Negative Screening - Now for repeat screening. N/A  History of Adenoma - Now for follow-up colonoscopy & has been > or = to 3 yrs.  N/A ASA CLASS:   Class III INDICATIONS:Unexplained iron deficiency anemia and Patient is not applicable for Colorectal Neoplasm Risk Assessment for this procedure. MEDICATIONS: Monitored anesthesia care and Propofol 250 mg IV  DESCRIPTION OF PROCEDURE:   After the risks benefits and alternatives of the procedure were thoroughly explained, informed consent was obtained.  The digital rectal exam revealed hemorrhoids.   The LB [id number]  endoscope was introduced through the anus and advanced to the terminal ileum which was intubated for a short distance. No adverse events experienced. The quality of the prep was (MoviPrep was used) good.  The instrument was then slowly withdrawn as the colon was fully examined.   COLON FINDINGS: The examined terminal ileum appeared to be normal. Five polyps, sessile and semi-pedunculated, ranging from 5 to 62mm in size were found at the cecum (1)and in the transverse colon (4). Polypectomies were performed with a cold snare.  The resection was complete, the polyp tissue was completely retrieved and sent to histology.   There was severe diverticulosis noted in the left colon with associated muscular hypertrophy.  Retroflexed views revealed internal hemorrhoids. The time to cecum = 2.5 Withdrawal time = 12.9   The scope was withdrawn and the procedure completed. COMPLICATIONS:  There were no immediate complications.  ENDOSCOPIC IMPRESSION: 1.   The examined terminal ileum appeared to be normal 2.   Five polyps ranging from 5 to 60mm in size were found at the cecum and in the transverse colon; polypectomies were performed with a cold snare 3.   There was severe diverticulosis noted in the left colon  RECOMMENDATIONS: 1.  Avoid all NSAIDS for the next 2 weeks. 2.  Await pathology results 3.  Given your age, you will likely not need another colonoscopy for colon cancer screening or polyp surveillance.  These types of tests usually stop around age 60. 4.  Proceed with EGD  eSigned:  Jerene Bears, MD 06/28/2014 2:56 PM   cc: Crist Infante, MD and The Patient   PATIENT NAME:  Allix, Blomquist MR#: [id number]

## 2014-06-28 NOTE — Progress Notes (Signed)
A/ox3, pleased with MAC, report to RN 

## 2014-06-29 ENCOUNTER — Telehealth: Payer: Self-pay | Admitting: *Deleted

## 2014-06-29 NOTE — Telephone Encounter (Signed)
  Follow up Call-  Call back number 06/28/2014  Post procedure Call Back phone  # [phone]  Permission to leave phone message Yes     Patient questions:  Do you have a fever, pain , or abdominal swelling? No. Pain Score  0 *  Have you tolerated food without any problems? Yes.    Have you been able to return to your normal activities? Yes.    Do you have any questions about your discharge instructions: Diet   No. Medications  No. Follow up visit  No.  Do you have questions or concerns about your Care? No.  Actions: * If pain score is 4 or above: No action needed, pain <4.

## 2014-07-12 ENCOUNTER — Encounter: Payer: Self-pay | Admitting: Internal Medicine

## 2014-10-04 ENCOUNTER — Other Ambulatory Visit: Payer: Self-pay

## 2014-12-27 ENCOUNTER — Other Ambulatory Visit (HOSPITAL_COMMUNITY): Payer: Self-pay | Admitting: Internal Medicine

## 2014-12-27 ENCOUNTER — Ambulatory Visit (HOSPITAL_COMMUNITY): Admission: RE | Admit: 2014-12-27 | Payer: Medicare Other | Source: Ambulatory Visit

## 2015-02-25 ENCOUNTER — Other Ambulatory Visit (HOSPITAL_COMMUNITY): Payer: Self-pay | Admitting: Internal Medicine

## 2015-02-25 ENCOUNTER — Ambulatory Visit (HOSPITAL_COMMUNITY)
Admission: RE | Admit: 2015-02-25 | Discharge: 2015-02-25 | Disposition: A | Discharge status: Home / Self Care | Payer: Medicare Other | Source: Ambulatory Visit | Attending: Internal Medicine | Admitting: Internal Medicine

## 2015-02-25 ENCOUNTER — Encounter (HOSPITAL_COMMUNITY): Payer: Self-pay

## 2015-02-25 DIAGNOSIS — M810 Age-related osteoporosis without current pathological fracture: Secondary | ICD-10-CM | POA: Diagnosis not present

## 2015-02-25 MED ORDER — DENOSUMAB 60 MG/ML ~~LOC~~ SOLN
60.0000 mg | Freq: Once | SUBCUTANEOUS | Status: AC
  Administered 2015-02-25: 60 mg via SUBCUTANEOUS
  Filled 2015-02-25: qty 1

## 2015-02-25 NOTE — Discharge Instructions (Signed)
PROLIA °Denosumab injection °What is this medicine? °DENOSUMAB (den oh sue mab) slows bone breakdown. Prolia is used to treat osteoporosis in women after menopause and in men. Xgeva is used to prevent bone fractures and other bone problems caused by cancer bone metastases. Xgeva is also used to treat giant cell tumor of the bone. °This medicine may be used for other purposes; ask your health care provider or pharmacist if you have questions. °What should I tell my health care provider before I take this medicine? °They need to know if you have any of these conditions: °-dental disease °-eczema °-infection or history of infections °-kidney disease or on dialysis °-low blood calcium or vitamin D °-malabsorption syndrome °-scheduled to have surgery or tooth extraction °-taking medicine that contains denosumab °-thyroid or parathyroid disease °-an unusual reaction to denosumab, other medicines, foods, dyes, or preservatives °-pregnant or trying to get pregnant °-breast-feeding °How should I use this medicine? °This medicine is for injection under the skin. It is given by a health care professional in a hospital or clinic setting. °If you are getting Prolia, a special MedGuide will be given to you by the pharmacist with each prescription and refill. Be sure to read this information carefully each time. °For Prolia, talk to your pediatrician regarding the use of this medicine in children. Special care may be needed. For Xgeva, talk to your pediatrician regarding the use of this medicine in children. While this drug may be prescribed for children as young as 13 years for selected conditions, precautions do apply. °Overdosage: If you think you have taken too much of this medicine contact a poison control center or emergency room at once. °NOTE: This medicine is only for you. Do not share this medicine with others. °What if I miss a dose? °It is important not to miss your dose. Call your doctor or health care professional if  you are unable to keep an appointment. °What may interact with this medicine? °Do not take this medicine with any of the following medications: °-other medicines containing denosumab °This medicine may also interact with the following medications: °-medicines that suppress the immune system °-medicines that treat cancer °-steroid medicines like prednisone or cortisone °This list may not describe all possible interactions. Give your health care provider a list of all the medicines, herbs, non-prescription drugs, or dietary supplements you use. Also tell them if you smoke, drink alcohol, or use illegal drugs. Some items may interact with your medicine. °What should I watch for while using this medicine? °Visit your doctor or health care professional for regular checks on your progress. Your doctor or health care professional may order blood tests and other tests to see how you are doing. °Call your doctor or health care professional if you get a cold or other infection while receiving this medicine. Do not treat yourself. This medicine may decrease your body's ability to fight infection. °You should make sure you get enough calcium and vitamin D while you are taking this medicine, unless your doctor tells you not to. Discuss the foods you eat and the vitamins you take with your health care professional. °See your dentist regularly. Brush and floss your teeth as directed. Before you have any dental work done, tell your dentist you are receiving this medicine. °Do not become pregnant while taking this medicine or for 5 months after stopping it. Women should inform their doctor if they wish to become pregnant or think they might be pregnant. There is a potential for serious side   effects to an unborn child. Talk to your health care professional or pharmacist for more information. °What side effects may I notice from receiving this medicine? °Side effects that you should report to your doctor or health care professional as  soon as possible: °-allergic reactions like skin rash, itching or hives, swelling of the face, lips, or tongue °-breathing problems °-chest pain °-fast, irregular heartbeat °-feeling faint or lightheaded, falls °-fever, chills, or any other sign of infection °-muscle spasms, tightening, or twitches °-numbness or tingling °-skin blisters or bumps, or is dry, peels, or red °-slow healing or unexplained pain in the mouth or jaw °-unusual bleeding or bruising °Side effects that usually do not require medical attention (Report these to your doctor or health care professional if they continue or are bothersome.): °-muscle pain °-stomach upset, gas °This list may not describe all possible side effects. Call your doctor for medical advice about side effects. You may report side effects to FDA at 1-800-FDA-1088. °Where should I keep my medicine? °This medicine is only given in a clinic, doctor's office, or other health care setting and will not be stored at home. °NOTE: This sheet is a summary. It may not cover all possible information. If you have questions about this medicine, talk to your doctor, pharmacist, or health care provider. °  °© 2016, Elsevier/Gold Standard. (2011-09-24 12:37:47) °Osteoporosis °Osteoporosis is the thinning and loss of density in the bones. Osteoporosis makes the bones more brittle, fragile, and likely to break (fracture). Over time, osteoporosis can cause the bones to become so weak that they fracture after a simple fall. The bones most likely to fracture are the bones in the hip, wrist, and spine. °CAUSES  °The exact cause is not known. °RISK FACTORS °Anyone can develop osteoporosis. You may be at greater risk if you have a family history of the condition or have poor nutrition. You may also have a higher risk if you are:  °· Female.   °· 50 years old or older. °· A smoker. °· Not physically active.   °· White or Asian. °· Slender. °SIGNS AND SYMPTOMS  °A fracture might be the first sign of the  disease, especially if it results from a fall or injury that would not usually cause a bone to break. Other signs and symptoms include:  °· Low back and neck pain. °· Stooped posture. °· Height loss. °DIAGNOSIS  °To make a diagnosis, your health care provider may: °· Take a medical history. °· Perform a physical exam. °· Order tests, such as: °¨ A bone mineral density test. °¨ A dual-energy X-ray absorptiometry test. °TREATMENT  °The goal of osteoporosis treatment is to strengthen your bones to reduce your risk of a fracture. Treatment may involve: °· Making lifestyle changes, such as: °¨ Eating a diet rich in calcium. °¨ Doing weight-bearing and muscle-strengthening exercises. °¨ Stopping tobacco use. °¨ Limiting alcohol intake. °· Taking medicine to slow the process of bone loss or to increase bone density. °· Monitoring your levels of calcium and vitamin D. °HOME CARE INSTRUCTIONS °· Include calcium and vitamin D in your diet. Calcium is important for bone health, and vitamin D helps the body absorb calcium. °· Perform weight-bearing and muscle-strengthening exercises as directed by your health care provider. °· Do not use any tobacco products, including cigarettes, chewing tobacco, and electronic cigarettes. If you need help quitting, ask your health care provider. °· Limit your alcohol intake. °· Take medicines only as directed by your health care provider. °· Keep all   follow-up visits as directed by your health care provider. This is important. °· Take precautions at home to lower your risk of falling, such as: °¨ Keeping rooms well lit and clutter free. °¨ Installing safety rails on stairs. °¨ Using rubber mats in the bathroom and other areas that are often wet or slippery. °SEEK IMMEDIATE MEDICAL CARE IF:  °You fall or injure yourself.  °  °This information is not intended to replace advice given to you by your health care provider. Make sure you discuss any questions you have with your health care  provider. °  °Document Released: 01/03/2005 Document Revised: 04/16/2014 Document Reviewed: 09/03/2013 °Elsevier Interactive Patient Education ©2016 Elsevier Inc. ° ° °

## 2015-07-06 ENCOUNTER — Ambulatory Visit (INDEPENDENT_AMBULATORY_CARE_PROVIDER_SITE_OTHER): Payer: Medicare Other | Admitting: Acute Care

## 2015-07-06 ENCOUNTER — Encounter: Payer: Self-pay | Admitting: Acute Care

## 2015-07-06 VITALS — BP 146/68 | HR 62 | Temp 98.1°F | Ht 63.0 in | Wt 202.6 lb

## 2015-07-06 DIAGNOSIS — J9622 Acute and chronic respiratory failure with hypercapnia: Secondary | ICD-10-CM

## 2015-07-06 DIAGNOSIS — J9621 Acute and chronic respiratory failure with hypoxia: Secondary | ICD-10-CM

## 2015-07-06 DIAGNOSIS — J302 Other seasonal allergic rhinitis: Secondary | ICD-10-CM

## 2015-07-06 NOTE — Patient Instructions (Addendum)
It is nice to meet you today. We will add Zyrtec daily for your allergies. Generic is ok to use. Flonase nasal spray for your allergies. Saline nasal spray to help with nasal congestion Ask your pharmacist which inhaled corticosteroid you plan covers best. Schedule you for pulmonary function tests. Schedule for follow up with first available MD  After pulmonary function tests. We will give you a Pro Air Respiclick for shortness of breath every 6 hours as needed. Look for silver sneakers programs to work on conditioning and exercise.  Please contact office for sooner follow up if symptoms do not improve or worsen or seek emergency care

## 2015-07-06 NOTE — Progress Notes (Signed)
Subjective:    Patient ID: Laura [name], female    DOB: 05/02/1929, 82 y.o.   MRN: [id number]  HPI  82 year old female with chronic respiratory failure secondary to obesity hypoventilation syndrome. She uses a non-invasive positive pressure device. PFT's indicate minimal airflow obstruction, but definite restriction.  No maintenance medication per Dr. Janifer Adie last plan, and uses albuterol inhaler if needed.She wears nocturnal oxygen for nocturnal desaturations per sleep study. Last visit to this office was 10/27/2013.  07/06/2015: Follow up office visit: Patient presents to office today having not been seen since July 2015.  She is here with postnasal drip and nasal stuffiness. She has not been taking a non-sedating antihistamine. She has not been using her allergy nasal spray.. She continues to use her noninvasive positive pressure device, but is not wearing her nocturnal oxygen. She was recently given Qvar by her primary care physician Dr. Hardie Shackleton to use "until she felt better". It was not his intent for this to be used as a maintenance medication. She is currently not using it due to cost, but stated it did make her feel better. Patient states she is deconditioned due to lack of exercise, which she feels is contributing to her shortness of breath with exertion. She denies chest pain, fever, orthopnea, hemoptysis, or leg pain. She appears somewhat confused about the medications she should be taking.   Current outpatient prescriptions:  .  acetaminophen (TYLENOL) 500 MG tablet, Take 500 mg by mouth every 6 (six) hours as needed for mild pain., Disp: , Rfl:  .  albuterol (PROVENTIL HFA;VENTOLIN HFA) 108 (90 BASE) MCG/ACT inhaler, Inhale 2 puffs into the lungs every 4 (four) hours as needed. For cough or wheezing, Disp: , Rfl:  .  allopurinol (ZYLOPRIM) 100 MG tablet, Take 100 mg by mouth daily as needed (gout)., Disp: , Rfl:  .  amLODipine (NORVASC) 5 MG tablet, Take 1 tablet (5  mg total) by mouth daily., Disp: , Rfl:  .  aspirin 81 MG tablet, Take 81 mg by mouth at bedtime. , Disp: , Rfl:  .  atorvastatin (LIPITOR) 80 MG tablet, Take 80 mg by mouth daily with supper. For hyperlipdemia, Disp: , Rfl:  .  Calcium Carbonate-Vitamin D (CALCIUM-VITAMIN D) 500-200 MG-UNIT tablet, Take 1 tablet by mouth daily., Disp: , Rfl:  .  ezetimibe (ZETIA) 10 MG tablet, Take 10 mg by mouth daily. For cholesterol, Disp: , Rfl:  .  furosemide (LASIX) 20 MG tablet, Take 20 mg by mouth daily. For CHF, Disp: , Rfl:  .  isosorbide mononitrate (IMDUR) 30 MG 24 hr tablet, Take 30 mg by mouth daily. For CAD, Disp: , Rfl:  .  metoprolol tartrate (LOPRESSOR) 25 MG tablet, Take 25 mg by mouth 2 (two) times daily. For HTN, Disp: , Rfl:  .  montelukast (SINGULAIR) 10 MG tablet, Take 10 mg by mouth every morning. For COPD/Asthma, Disp: , Rfl:  .  nitroGLYCERIN (NITROSTAT) 0.4 MG SL tablet, Place 0.4 mg under the tongue every 5 (five) minutes as needed for chest pain., Disp: , Rfl:  .  ondansetron (ZOFRAN-ODT) 8 MG disintegrating tablet, Take 8 mg by mouth every 8 (eight) hours as needed for nausea. , Disp: , Rfl:  .  pantoprazole (PROTONIX) 40 MG tablet, Take 40 mg by mouth 2 (two) times daily before a meal. For GERD, Disp: , Rfl:  .  potassium chloride (K-DUR,KLOR-CON) 10 MEQ tablet, Take 10 mEq by mouth daily. Potassium supplement, Disp: , Rfl:  .  sertraline (ZOLOFT) 50 MG tablet, Take 50 mg by mouth daily with breakfast. For depression, Disp: , Rfl:  .  tamsulosin (FLOMAX) 0.4 MG CAPS capsule, Take 0.4 mg by mouth daily. , Disp: , Rfl:  .  Vitamin D, Ergocalciferol, (DRISDOL) 50000 UNITS CAPS capsule, Take 50,000 Units by mouth every 7 (seven) days. For Vitamin D, Disp: , Rfl:    Past Medical History  Diagnosis Date  . Coronary artery disease   . Myocardial infarction ([place])   . Arthritis   . Asthma   . Chronic bronchitis   . Urinary tract infection   . Kidney stone   . Hydronephrosis   .  Complication of anesthesia   . PONV (postoperative nausea and vomiting)   . Dysrhythmia   . Peripheral vascular disease ([place])   . CHF (congestive heart failure) ([place])   . Sleep apnea   . Shortness of breath   . Depression   . Hypernatremia     resolved  . Anemia   . Thrombocytopenia ([place])     resolved  . Subclinical hypothyroidism   . Thoracic aortic aneurysm ([place])   . Obesity hypoventilation syndrome ([place])   . Hypertension     pulmonary hypotension secondary to chronic heart/lung disease  . Acute encephalopathy     resolved  . Transient hypotension     resolved with fluids  . COPD (chronic obstructive pulmonary disease) ([place])   . Nephrolithiasis     Allergies  Allergen Reactions  . Codeine Nausea And Vomiting  . Hydrocodone Other (See Comments)    delusions  . Morphine And Related Other (See Comments)    Extremely cofused   Review of Systems Constitutional:   No  weight loss, night sweats,  Fevers, chills, fatigue, or  lassitude.  HEENT:   No headaches,  Difficulty swallowing,  Tooth/dental problems, or  Sore throat,                + sneezing, itching, ear ache,+ nasal congestion,+ post nasal drip,   CV:  No chest pain,  Orthopnea, PND, swelling in lower extremities, anasarca, dizziness, palpitations, syncope.   GI  No heartburn, indigestion, abdominal pain, nausea, vomiting, diarrhea, change in bowel habits, loss of appetite, bloody stools.   Resp: + shortness of breath with exertion not at rest.  + excess mucus, no productive cough,  No non-productive cough,  No coughing up of blood.  + change in color of mucus.  No wheezing.  No chest wall deformity  Skin: no rash or lesions.  GU: no dysuria, change in color of urine, no urgency or frequency.  No flank pain, no hematuria   MS:  No joint pain or swelling.  No decreased range of motion.  No back pain.  Psych:  No change in mood or affect. No depression or anxiety.  No memory loss.        Objective:    Physical Exam  BP 146/68 mmHg  Pulse 62  Temp(Src) 98.1 F (36.7 C) (Oral)  Ht 5\' 3"  (1.6 m)  Wt 202 lb 9.6 oz (91.899 kg)  BMI 35.90 kg/m2  SpO2 93%   Physical Exam:  General- No distress,  A&Ox3, using a walker ENT: No sinus tenderness, TM clear, pale nasal mucosa, no oral exudate,+ post nasal drip, no LAN Cardiac: S1, S2, regular rate and rhythm, no murmur Chest: No wheeze/ rales/ dullness; no accessory muscle use, no nasal flaring, no sternal retractions Abd.: Soft Non-tender Ext: No clubbing cyanosis, edema  Neuro:  normal strength Skin: No rashes, warm and dry Psych: normal mood and behavior  Magdalen Spatz, AGACNP-BC [place] Pager # [phone] 07/06/2015     Assessment & Plan:

## 2015-07-06 NOTE — Assessment & Plan Note (Signed)
Pt states she is compliant with her noninvasive positive pressure device nightly. She is currently not using nocturnal oxygen with device, as was ordered per Dr. Gwenette Greet  Plan:  Continue using trilogy device nightly. Teaching done regarding need to wear nocturnal oxygen per original order. Teaching done regarding benefit of losing weight. Encouraged Silver sneakers exercise plan.

## 2015-07-06 NOTE — Assessment & Plan Note (Signed)
Seasonal Allergies with PND and runny nose. Recent Sinusitis treated by PCP.  Plan: We will add Zyrtec daily for your allergies. Generic is ok to use. Flonase nasal spray for your allergies. Saline nasal spray to help with nasal congestion Ask your pharmacist which inhaled corticosteroid you plan covers best. We will Schedule you for pulmonary function tests. Schedule for follow up with first available MD  After pulmonary function tests. Assess if ICS necessary as maintenance medication.( Per last assessment Dr. Gwenette Greet did not have pt on maintenance medication. We will give you a Pro Air Respiclick for shortness of breath every 6 hours as needed. Look for silver sneakers programs to work on conditioning and exercise.  Please contact office for sooner follow up if symptoms do not improve or worsen or seek emergency care

## 2015-07-08 ENCOUNTER — Telehealth: Payer: Self-pay | Admitting: Acute Care

## 2015-07-08 NOTE — Telephone Encounter (Signed)
Per 07/06/15 OV: Patient Instructions       It is nice to meet you today. We will add Zyrtec daily for your allergies. Generic is ok to use. Flonase nasal spray for your allergies. Saline nasal spray to help with nasal congestion Ask your pharmacist which inhaled corticosteroid you plan covers best. Schedule you for pulmonary function tests. Schedule for follow up with first available MD  After pulmonary function tests. We will give you a Pro Air Respiclick for shortness of breath every 6 hours as needed. Look for silver sneakers programs to work on conditioning and exercise.   Please contact office for sooner follow up if symptoms do not improve or worsen or seek emergency care   ---   ATC pt, line rings busy x 3 WCB

## 2015-07-08 NOTE — Telephone Encounter (Signed)
Patient's husband called and states they spoke with pharmacy and Advair, Symbicort and Flovent are covered.  Pharmacy is [facility] at [employer] [address].  CB [phone]

## 2015-07-08 NOTE — Telephone Encounter (Signed)
Patient's husband called to let me know that their insurance covers Advair, Symbicort and Flovent.  I spoke with patient and she has tried Advair in the past and does not like it.  She would rather have the Symbicort or Flovent.  Patient is requesting something be called in as soon as possible because she "needs it now".    Sarah, please advise.

## 2015-07-08 NOTE — Telephone Encounter (Signed)
I returned Laura [name]'s phone call. Her primary care physician had given her trial of Qvar a few months ago when she had an acute flare. She stated that she liked it, but the cost was prohibitive with her insurance. Her PCP had intended her only to use it for her acute event.(We called the office to confirm this.) He did not intended for maintenance use. I did have Laura [name]'s husband check with her insurance to determine alternate ICS that could be utilized as an alternate to Qvar should be determined that she needs maintenance medication. Per the phone call this afternoon their  insurance covers both Asmanex HFA and Flovent HFA as alternatives that are more affordable. Laura [name] has an appointment with Dr. Ashok Cordia in June, should he decide to start Laura [name] on maintenance medication both Asmanex HFA and Flovent HFA are [name] options for ICS treatment that are affordable for the patient. I explained this to Laura [name]. I told her that we would relay this information to Dr. Ashok Cordia, so that should he decide to start her on a maintenance medication, he will know which options are covered well by their insurance. She is currently utilizing [employer] as a rescue medication for any shortness of breath. She understands to call if she needs to be seen prior to June. She verbalized understanding of the above and had no further questions.

## 2015-07-13 MED ORDER — FLUTICASONE PROPIONATE HFA 110 MCG/ACT IN AERO: 2.0000 | INHALATION_SPRAY | Freq: Two times a day (BID) | RESPIRATORY_TRACT | Status: AC

## 2015-07-13 NOTE — Telephone Encounter (Signed)
Rx sent to pharmacy. Called and advised patient that rx has been sent. Nothing further needed.

## 2015-07-13 NOTE — Telephone Encounter (Signed)
Please call in Flovent 44 mcg  2 puffs twice daily.

## 2015-07-19 ENCOUNTER — Other Ambulatory Visit (HOSPITAL_COMMUNITY): Payer: Self-pay | Admitting: Internal Medicine

## 2015-07-19 ENCOUNTER — Ambulatory Visit (HOSPITAL_COMMUNITY)
Admission: RE | Admit: 2015-07-19 | Discharge: 2015-07-19 | Disposition: A | Discharge status: Home / Self Care | Payer: Medicare Other | Source: Ambulatory Visit | Attending: Vascular Surgery | Admitting: Vascular Surgery

## 2015-07-19 DIAGNOSIS — I714 Abdominal aortic aneurysm, without rupture, unspecified: Secondary | ICD-10-CM

## 2015-07-19 DIAGNOSIS — I509 Heart failure, unspecified: Secondary | ICD-10-CM | POA: Insufficient documentation

## 2015-07-19 DIAGNOSIS — I10 Essential (primary) hypertension: Secondary | ICD-10-CM | POA: Insufficient documentation

## 2015-08-04 ENCOUNTER — Encounter (HOSPITAL_COMMUNITY): Payer: Self-pay | Admitting: *Deleted

## 2015-08-04 ENCOUNTER — Emergency Department (HOSPITAL_COMMUNITY)
Admission: EM | Admit: 2015-08-04 | Discharge: 2015-08-04 | Disposition: A | Discharge status: Home / Self Care | Payer: Medicare Other | Attending: Emergency Medicine | Admitting: Emergency Medicine

## 2015-08-04 DIAGNOSIS — Z87440 Personal history of urinary (tract) infections: Secondary | ICD-10-CM | POA: Diagnosis not present

## 2015-08-04 DIAGNOSIS — S81811A Laceration without foreign body, right lower leg, initial encounter: Secondary | ICD-10-CM | POA: Insufficient documentation

## 2015-08-04 DIAGNOSIS — I2510 Atherosclerotic heart disease of native coronary artery without angina pectoris: Secondary | ICD-10-CM | POA: Insufficient documentation

## 2015-08-04 DIAGNOSIS — Y998 Other external cause status: Secondary | ICD-10-CM | POA: Insufficient documentation

## 2015-08-04 DIAGNOSIS — J449 Chronic obstructive pulmonary disease, unspecified: Secondary | ICD-10-CM | POA: Diagnosis not present

## 2015-08-04 DIAGNOSIS — Y92009 Unspecified place in unspecified non-institutional (private) residence as the place of occurrence of the external cause: Secondary | ICD-10-CM | POA: Insufficient documentation

## 2015-08-04 DIAGNOSIS — W19XXXA Unspecified fall, initial encounter: Secondary | ICD-10-CM

## 2015-08-04 DIAGNOSIS — I509 Heart failure, unspecified: Secondary | ICD-10-CM | POA: Insufficient documentation

## 2015-08-04 DIAGNOSIS — Z8669 Personal history of other diseases of the nervous system and sense organs: Secondary | ICD-10-CM | POA: Diagnosis not present

## 2015-08-04 DIAGNOSIS — Z862 Personal history of diseases of the blood and blood-forming organs and certain disorders involving the immune mechanism: Secondary | ICD-10-CM | POA: Diagnosis not present

## 2015-08-04 DIAGNOSIS — W01198A Fall on same level from slipping, tripping and stumbling with subsequent striking against other object, initial encounter: Secondary | ICD-10-CM | POA: Diagnosis not present

## 2015-08-04 DIAGNOSIS — Z87442 Personal history of urinary calculi: Secondary | ICD-10-CM | POA: Diagnosis not present

## 2015-08-04 DIAGNOSIS — I252 Old myocardial infarction: Secondary | ICD-10-CM | POA: Insufficient documentation

## 2015-08-04 DIAGNOSIS — S8991XA Unspecified injury of right lower leg, initial encounter: Secondary | ICD-10-CM | POA: Diagnosis present

## 2015-08-04 DIAGNOSIS — Z79899 Other long term (current) drug therapy: Secondary | ICD-10-CM | POA: Diagnosis not present

## 2015-08-04 DIAGNOSIS — Y9389 Activity, other specified: Secondary | ICD-10-CM | POA: Insufficient documentation

## 2015-08-04 DIAGNOSIS — Z7951 Long term (current) use of inhaled steroids: Secondary | ICD-10-CM | POA: Insufficient documentation

## 2015-08-04 DIAGNOSIS — M1990 Unspecified osteoarthritis, unspecified site: Secondary | ICD-10-CM | POA: Diagnosis not present

## 2015-08-04 DIAGNOSIS — F329 Major depressive disorder, single episode, unspecified: Secondary | ICD-10-CM | POA: Insufficient documentation

## 2015-08-04 DIAGNOSIS — E662 Morbid (severe) obesity with alveolar hypoventilation: Secondary | ICD-10-CM | POA: Diagnosis not present

## 2015-08-04 DIAGNOSIS — Z7982 Long term (current) use of aspirin: Secondary | ICD-10-CM | POA: Diagnosis not present

## 2015-08-04 DIAGNOSIS — IMO0002 Reserved for concepts with insufficient information to code with codable children: Secondary | ICD-10-CM

## 2015-08-04 DIAGNOSIS — I10 Essential (primary) hypertension: Secondary | ICD-10-CM | POA: Insufficient documentation

## 2015-08-04 LAB — CBC
HEMATOCRIT: 47.9 % — AB (ref 36.0–46.0)
HEMOGLOBIN: 14.9 g/dL (ref 12.0–15.0)
MCH: 30.3 pg (ref 26.0–34.0)
MCHC: 31.1 g/dL (ref 30.0–36.0)
MCV: 97.4 fL (ref 78.0–100.0)
Platelets: 96 10*3/uL — ABNORMAL LOW (ref 150–400)
RBC: 4.92 MIL/uL (ref 3.87–5.11)
RDW: 13.8 % (ref 11.5–15.5)
WBC: 6.7 10*3/uL (ref 4.0–10.5)

## 2015-08-04 MED ORDER — LIDOCAINE-EPINEPHRINE 1 %-1:100000 IJ SOLN
INTRAMUSCULAR | Status: AC
  Administered 2015-08-04: 13:00:00
  Filled 2015-08-04: qty 1

## 2015-08-04 MED ORDER — LIDOCAINE-EPINEPHRINE (PF) 2 %-1:200000 IJ SOLN: 10.0000 mL | Freq: Once | INTRAMUSCULAR | Status: DC

## 2015-08-04 NOTE — ED Notes (Signed)
Bed: WA09 Expected date:  Expected time:  Means of arrival:  Comments: EMS-fall 

## 2015-08-04 NOTE — ED Notes (Signed)
Per EMS, from home, uses walker/cane at home. Lost her balance, fell, hit right leg. Patient and EMS bandaged leg, but they state it is actively bleeding through the bandages. Pt is on blood thinners, hx of CHF, per EMS legs slightly swollen.

## 2015-08-04 NOTE — ED Provider Notes (Signed)
CSN: [id number]     Arrival date & time 08/04/15  1056 History   First MD Initiated Contact with Patient 08/04/15 1121     Chief Complaint  Patient presents with  . Fall  . Leg Injury   (Consider location/radiation/quality/duration/timing/severity/associated sxs/prior Treatment) HPI 82 y.o. female with a hx of CHF, presents to the Emergency Department today due to actively bleeding right lower leg injury. Pt states that she was ambulating with her cane when she lost her balance and had a mechanical fall. Pt notes that her right leg struck her walker that was near her and caused a laceration of her lower leg. No head trauma. No syncope. No LOC. Pt tried to control laceration that was bleeding, but was unable to do so. Notes getting the floor very bloody. Attempted direct pressure. No pain currently. No headache. No N/V/D. No visiual disturbances. No CP/SOB/ABD pain. Pt is on baby ASA and no other blood thinners. No coagulation disorder. No other symptoms noted.   Past Medical History  Diagnosis Date  . Coronary artery disease   . Myocardial infarction ([place])   . Arthritis   . Asthma   . Chronic bronchitis   . Urinary tract infection   . Kidney stone   . Hydronephrosis   . Complication of anesthesia   . PONV (postoperative nausea and vomiting)   . Dysrhythmia   . Peripheral vascular disease ([place])   . CHF (congestive heart failure) ([place])   . Sleep apnea   . Shortness of breath   . Depression   . Hypernatremia     resolved  . Anemia   . Thrombocytopenia ([place])     resolved  . Subclinical hypothyroidism   . Thoracic aortic aneurysm ([place])   . Obesity hypoventilation syndrome ([place])   . Hypertension     pulmonary hypotension secondary to chronic heart/lung disease  . Acute encephalopathy     resolved  . Transient hypotension     resolved with fluids  . COPD (chronic obstructive pulmonary disease) ([place])   . Nephrolithiasis    Past Surgical History  Procedure Laterality Date  .  Coronary stent placement    . Appendectomy    . Tonsillectomy    . Knee surgery    . Cystoscopy w/ ureteral stent placement Right 04/04/2013    Procedure: CYSTOSCOPY WITH RETROGRADE PYELOGRAM/URETERAL STENT PLACEMENT;  Surgeon: Molli Hazard, MD;  Location: [facility];  Service: Urology;  Laterality: Right;  . Cystoscopy with retrograde pyelogram, ureteroscopy and stent placement Right 04/30/2013    Procedure: CYSTOSCOPY WITH RETROGRADE PYELOGRAM, URETEROSCOPY AND STENT EXCHANGE;  Surgeon: Molli Hazard, MD;  Location: [facility];  Service: Urology;  Laterality: Right;  . Holmium laser application Right [id number]    Procedure: HOLMIUM LASER APPLICATION;  Surgeon: Molli Hazard, MD;  Location: [facility];  Service: Urology;  Laterality: Right;   Family History  Problem Relation Age of Onset  . Adopted: Yes   Social History  Substance Use Topics  . Smoking status: Never Smoker   . Smokeless tobacco: Never Used  . Alcohol Use: No   OB History    No data available     Review of Systems ROS reviewed and all are negative for acute change except as noted in the HPI.  Allergies  Codeine; Hydrocodone; and Morphine and related  Home Medications   Prior to Admission medications   Medication Sig Start Date End Date Taking? Authorizing Provider  acetaminophen (TYLENOL) 500 MG tablet Take 500  mg by mouth every 6 (six) hours as needed for mild pain.    Historical Provider, MD  albuterol (PROVENTIL HFA;VENTOLIN HFA) 108 (90 BASE) MCG/ACT inhaler Inhale 2 puffs into the lungs every 4 (four) hours as needed. For cough or wheezing    Historical Provider, MD  allopurinol (ZYLOPRIM) 100 MG tablet Take 100 mg by mouth daily as needed (gout).    Historical Provider, MD  amLODipine (NORVASC) 5 MG tablet Take 1 tablet (5 mg total) by mouth daily. 08/11/13   Erick Colace, NP  aspirin 81 MG tablet Take 81 mg by mouth at bedtime.     Historical Provider, MD  atorvastatin (LIPITOR) 80 MG  tablet Take 80 mg by mouth daily with supper. For hyperlipdemia    Historical Provider, MD  Calcium Carbonate-Vitamin D (CALCIUM-VITAMIN D) 500-200 MG-UNIT tablet Take 1 tablet by mouth daily.    Historical Provider, MD  ezetimibe (ZETIA) 10 MG tablet Take 10 mg by mouth daily. For cholesterol    Historical Provider, MD  fluticasone (FLOVENT HFA) 110 MCG/ACT inhaler Inhale 2 puffs into the lungs 2 (two) times daily. 07/13/15   Magdalen Spatz, NP  furosemide (LASIX) 20 MG tablet Take 20 mg by mouth daily. For CHF 08/11/13   Erick Colace, NP  isosorbide mononitrate (IMDUR) 30 MG 24 hr tablet Take 30 mg by mouth daily. For CAD    Historical Provider, MD  metoprolol tartrate (LOPRESSOR) 25 MG tablet Take 25 mg by mouth 2 (two) times daily. For HTN 08/11/13   Erick Colace, NP  montelukast (SINGULAIR) 10 MG tablet Take 10 mg by mouth every morning. For COPD/Asthma    Historical Provider, MD  nitroGLYCERIN (NITROSTAT) 0.4 MG SL tablet Place 0.4 mg under the tongue every 5 (five) minutes as needed for chest pain.    Historical Provider, MD  ondansetron (ZOFRAN-ODT) 8 MG disintegrating tablet Take 8 mg by mouth every 8 (eight) hours as needed for nausea.  03/31/13   Historical Provider, MD  pantoprazole (PROTONIX) 40 MG tablet Take 40 mg by mouth 2 (two) times daily before a meal. For GERD 08/11/13   Erick Colace, NP  potassium chloride (K-DUR,KLOR-CON) 10 MEQ tablet Take 10 mEq by mouth daily. Potassium supplement    Historical Provider, MD  sertraline (ZOLOFT) 50 MG tablet Take 50 mg by mouth daily with breakfast. For depression 04/03/13   Historical Provider, MD  tamsulosin (FLOMAX) 0.4 MG CAPS capsule Take 0.4 mg by mouth daily.  04/07/13   Orson Eva, MD  Vitamin D, Ergocalciferol, (DRISDOL) 50000 UNITS CAPS capsule Take 50,000 Units by mouth every 7 (seven) days. For Vitamin D    Historical Provider, MD   BP 134/103 mmHg  Pulse 55  Temp(Src) 98.3 F (36.8 C) (Oral)  Resp 18  SpO2 92%   Physical  Exam  Constitutional: She is oriented to person, place, and time. She appears well-developed and well-nourished.  HENT:  Head: Normocephalic and atraumatic.  Eyes: EOM are normal. Pupils are equal, round, and reactive to light.  Neck: Normal range of motion.  Cardiovascular: Normal rate and regular rhythm.   Pulmonary/Chest: Effort normal.  Abdominal: Soft.  Musculoskeletal: Normal range of motion.  Distal pulses intact bilaterally. Cap refill <2sec. Motor/sensation intact. No swelling noted. Pt able to ambulate well. Full ROM of lower extremities without difficulty. Extremities non TTP   Neurological: She is alert and oriented to person, place, and time.  Skin: Skin is warm and dry.  Superficial  laceration noted on anterior tibia of right leg. Around 2cm length. Blood oozing from wound. Non pulsatile. Skin is folded over   Psychiatric: She has a normal mood and affect. Her behavior is normal. Thought content normal.  Nursing note and vitals reviewed.  ED Course  .Marland KitchenLaceration Repair Date/Time: 08/04/2015 3:58 PM Performed by: Shary Decamp Authorized by: Shary Decamp Consent: Verbal consent obtained. Consent given by: patient Patient understanding: patient states understanding of the procedure being performed Patient consent: the patient's understanding of the procedure matches consent given Procedure consent: procedure consent matches procedure scheduled Patient identity confirmed: verbally with patient and arm band Body area: lower extremity Location details: right lower leg Laceration length: 2 cm Foreign bodies: no foreign bodies Tendon involvement: none Nerve involvement: none Vascular damage: no Anesthesia: local infiltration Local anesthetic: lidocaine 1% with epinephrine Anesthetic total: 2 ml Preparation: Patient was prepped and draped in the usual sterile fashion. Irrigation solution: saline Irrigation method: jet lavage Amount of cleaning: standard Debridement:  moderate Degree of undermining: minimal Skin closure: 4-0 Prolene Number of sutures: 6 Technique: simple Approximation: close Approximation difficulty: simple Dressing: 4x4 sterile gauze Patient tolerance: Patient tolerated the procedure well with no immediate complications   (including critical care time) Labs Review Labs Reviewed  CBC - Abnormal; Notable for the following:    HCT 47.9 (*)    Platelets 96 (*)    All other components within normal limits   Imaging Review No results found. I have personally reviewed and evaluated these images and lab results as part of my medical decision-making.   EKG Interpretation None      MDM  I have reviewed and evaluated the relevant laboratory values I have reviewed the relevant previous healthcare records. I obtained HPI from historian. Patient discussed with supervising physician  ED Course:  Assessment: Pt is a 58yF with hx CHF who presents with right lower leg laceration from mechanical fall. On exam, pt in NAD. Nontoxic/nonseptic appearing. VSS. Afebrile. Lungs CTA. Heart RRR. Superficial skin torn on anterior tibia. Repaired with 4-0 Prolene #6. Covered with sterile gauze. Pt is neurovascularly intact. Distal pulses appreciated with good motor/sensation. Able to ambulate. Extremities non TTP. CBC did show Thrombocytopenia. Will DC patient home with follow up to PCP for suture removal in 8-10 days and further evaluation for low platelets. Most likely cause for uncontrolled bleeding as pt is only on baby ASA. At time of discharge, Patient is in no acute distress. Vital Signs are stable. Patient is able to ambulate. Patient able to tolerate PO.   Disposition/Plan:  DC Home Additional Verbal discharge instructions given and discussed with patient.  Pt Instructed to f/u with PCP in 8-10 days for evaluation and treatment of symptoms. Return precautions given Pt acknowledges and agrees with plan  Supervising Physician Charlesetta Shanks,  MD   Final diagnoses:  Laceration  Fall, initial encounter      Shary Decamp, PA-C 08/04/15 [place], MD 08/18/15 [phone]

## 2015-08-04 NOTE — Discharge Instructions (Signed)
Please read and follow all provided instructions.  Your diagnoses today include:  1. Laceration   2. Fall, initial encounter     Tests performed today include:  Vital signs. See below for your results today.   Medications prescribed:   Take any prescribed medications only as directed.   Home care instructions:  Follow any educational materials and wound care instructions contained in this packet.   You may shower and wash the area with soap and water, just be sure to pat the area dry and not rub over the stitches. Do no put your stiches underwater (in a bath, pool, or lake). Getting stiches wet can slow down healing and increase your chances of getting an infection. You may apply Bacitracin or Neosporin twice a day for 7 days, and keep the ara clean with  bandage or gauze. Do not apply alcohol or hydrogen peroxide. Cover the area if it draining or weeping.   Follow-up instructions: Suture Removal: Return to the Emergency Department or see your primary care care doctor in 8-10 days for a recheck of your wound and removal of your sutures or staples.    Return instructions:  Return to the Emergency Department if you have:  Fever  Worsening pain  Worsening swelling of the wound  Pus draining from the wound  Redness of the skin that moves away from the wound, especially if it streaks away from the affected area   Any other emergent concerns  Your vital signs today were: BP 134/103 mmHg   Pulse 55   Temp(Src) 98.3 F (36.8 C) (Oral)   Resp 18   SpO2 92% If your blood pressure (BP) was elevated above 135/85 this visit, please have this repeated by your doctor within one month. --------------

## 2015-08-22 ENCOUNTER — Other Ambulatory Visit: Payer: Self-pay | Admitting: Pulmonary Disease

## 2015-08-22 DIAGNOSIS — R0600 Dyspnea, unspecified: Secondary | ICD-10-CM

## 2015-08-23 ENCOUNTER — Ambulatory Visit (INDEPENDENT_AMBULATORY_CARE_PROVIDER_SITE_OTHER): Payer: Medicare Other | Admitting: Pulmonary Disease

## 2015-08-23 DIAGNOSIS — R0600 Dyspnea, unspecified: Secondary | ICD-10-CM | POA: Diagnosis not present

## 2015-08-23 LAB — PULMONARY FUNCTION TEST
FEF 25-75 POST: 0.51 L/s
FEF 25-75 PRE: 0.42 L/s
FEF2575-%Change-Post: 21 %
FEF2575-%PRED-POST: 45 %
FEF2575-%PRED-PRE: 37 %
FEV1-%Change-Post: 4 %
FEV1-%Pred-Post: 40 %
FEV1-%Pred-Pre: 38 %
FEV1-POST: 0.66 L
FEV1-PRE: 0.62 L
FEV1FVC-%Change-Post: 2 %
FEV1FVC-%PRED-PRE: 98 %
FEV6-%CHANGE-POST: 1 %
FEV6-%PRED-PRE: 41 %
FEV6-%Pred-Post: 42 %
FEV6-POST: 0.88 L
FEV6-Pre: 0.87 L
FEV6FVC-%PRED-POST: 106 %
FEV6FVC-%Pred-Pre: 106 %
FVC-%CHANGE-POST: 1 %
FVC-%Pred-Post: 39 %
FVC-%Pred-Pre: 38 %
FVC-PRE: 0.87 L
FVC-Post: 0.88 L
POST FEV6/FVC RATIO: 100 %
PRE FEV1/FVC RATIO: 72 %
Post FEV1/FVC ratio: 74 %
Pre FEV6/FVC Ratio: 100 %

## 2015-08-23 NOTE — Progress Notes (Signed)
Patient ID: Laura [name], female   DOB: [birth year], 82 y.o.   MRN: [id number]   PFT done today. Blair Hailey

## 2015-08-26 ENCOUNTER — Ambulatory Visit (HOSPITAL_COMMUNITY)
Admission: RE | Admit: 2015-08-26 | Discharge: 2015-08-26 | Disposition: A | Discharge status: Home / Self Care | Payer: Medicare Other | Source: Ambulatory Visit | Attending: Internal Medicine | Admitting: Internal Medicine

## 2015-09-12 ENCOUNTER — Ambulatory Visit: Payer: Self-pay | Admitting: Pulmonary Disease

## 2015-09-28 ENCOUNTER — Telehealth: Payer: Self-pay | Admitting: Pulmonary Disease

## 2015-09-28 ENCOUNTER — Other Ambulatory Visit: Payer: Medicare Other

## 2015-09-28 ENCOUNTER — Encounter: Payer: Self-pay | Admitting: Pulmonary Disease

## 2015-09-28 ENCOUNTER — Ambulatory Visit (INDEPENDENT_AMBULATORY_CARE_PROVIDER_SITE_OTHER): Payer: Medicare Other | Admitting: Pulmonary Disease

## 2015-09-28 VITALS — BP 140/64 | HR 68 | Ht 63.0 in | Wt 195.0 lb

## 2015-09-28 DIAGNOSIS — E662 Morbid (severe) obesity with alveolar hypoventilation: Secondary | ICD-10-CM | POA: Diagnosis not present

## 2015-09-28 DIAGNOSIS — R0600 Dyspnea, unspecified: Secondary | ICD-10-CM

## 2015-09-28 DIAGNOSIS — J449 Chronic obstructive pulmonary disease, unspecified: Secondary | ICD-10-CM | POA: Insufficient documentation

## 2015-09-28 DIAGNOSIS — J9611 Chronic respiratory failure with hypoxia: Secondary | ICD-10-CM

## 2015-09-28 NOTE — Progress Notes (Signed)
Subjective:    Patient ID: Laura [name], female    DOB: [birth year], 82 y.o.   MRN: [id number]  C.C.:  Follow-up for Severe COPD, Obesity Hypoventilation Syndrome, & Chronic Hypoxic Respiratory Failure.  HPI Severe COPD:  She uses her rescue 2-3 times a day at most. Denies any coughing or wheezing. She reports she was previously told she had "bronchial asthma". Has a history of bronchitis occuring twice yearly in the Fall & Spring but none recently. No breathing problems as a child.   Obesity Hypoventilation Syndrome:  Currently using Trilogy with supplemental oxygen (AVAPS auto, TV target 400-500, PS 5-15, EPAP 4-12). She reports she has frequent problems with her machine. She recently was fitted for a new mask to help prevent leaks. The mask leaks occur intermittently throughout the night and keep her awake.   Chronic Hypoxic Respiratory Failure:  She intermittently uses 2 L/m of oxygen with exertion. Not currently using oxygen at night while on NIPPV.  Review of Systems Denies any sinus congestion, pressure, or drainage. No chest pain or pressure. No fever, chills, or sweats.   Allergies  Allergen Reactions  . Codeine Nausea And Vomiting  . Hydrocodone Other (See Comments)    delusions  . Morphine And Related Other (See Comments)    Extremely cofused    Current Outpatient Prescriptions on File Prior to Visit  Medication Sig Dispense Refill  . acetaminophen (TYLENOL) 500 MG tablet Take 500 mg by mouth every 6 (six) hours as needed for mild pain.    Marland Kitchen albuterol (PROVENTIL HFA;VENTOLIN HFA) 108 (90 BASE) MCG/ACT inhaler Inhale 2 puffs into the lungs every 4 (four) hours as needed for wheezing (cough).     Marland Kitchen allopurinol (ZYLOPRIM) 100 MG tablet Take 100 mg by mouth daily as needed (gout).    Marland Kitchen amLODipine (NORVASC) 5 MG tablet Take 1 tablet (5 mg total) by mouth daily.    Marland Kitchen aspirin 81 MG tablet Take 81 mg by mouth at bedtime.     Marland Kitchen atorvastatin (LIPITOR) 40 MG tablet Take 40 mg by  mouth daily.    . colchicine 0.6 MG tablet Take 0.6 mg by mouth daily as needed (gout.).    Marland Kitchen Cyanocobalamin (VITAMIN B 12 PO) Take 1,000 mcg by mouth daily.    Marland Kitchen denosumab (PROLIA) 60 MG/ML SOLN injection Inject 60 mg into the skin every 6 (six) months. Administer in upper arm, thigh, or abdomen    . dextromethorphan-guaiFENesin (MUCINEX DM) 30-600 MG 12hr tablet Take 1 tablet by mouth daily as needed for cough.    . ezetimibe (ZETIA) 10 MG tablet Take 10 mg by mouth daily.     . ferrous sulfate 325 (65 FE) MG tablet Take 325 mg by mouth daily with breakfast.    . fluticasone (FLOVENT HFA) 110 MCG/ACT inhaler Inhale 2 puffs into the lungs 2 (two) times daily. 1 Inhaler 12  . furosemide (LASIX) 40 MG tablet Take 20 mg by mouth. On every third or fourth day.    . metoprolol tartrate (LOPRESSOR) 25 MG tablet Take 25 mg by mouth 2 (two) times daily. For HTN    . montelukast (SINGULAIR) 10 MG tablet Take 10 mg by mouth every morning. For COPD/Asthma    . nitroGLYCERIN (NITROSTAT) 0.4 MG SL tablet Place 0.4 mg under the tongue every 5 (five) minutes as needed for chest pain.    . NON FORMULARY at bedtime. CPAP    . ondansetron (ZOFRAN-ODT) 8 MG disintegrating tablet Take 8 mg  by mouth every 8 (eight) hours as needed for nausea.     . pantoprazole (PROTONIX) 40 MG tablet Take 40 mg by mouth daily. For GERD    . potassium chloride (K-DUR,KLOR-CON) 10 MEQ tablet Take 10 mEq by mouth daily. On days she takes furosemide only.    . Probiotic Product (PROBIOTIC PO) Take 10 mg by mouth daily.    . sertraline (ZOLOFT) 50 MG tablet Take 50 mg by mouth daily with breakfast. For depression    . tamsulosin (FLOMAX) 0.4 MG CAPS capsule Take 0.4 mg by mouth daily.     . Vitamin D, Ergocalciferol, (DRISDOL) 50000 UNITS CAPS capsule Take 50,000 Units by mouth every 7 (seven) days. For Vitamin D     No current facility-administered medications on file prior to visit.    Past Medical History  Diagnosis Date  .  Coronary artery disease   . Myocardial infarction ([place])   . Arthritis   . Asthma   . Chronic bronchitis   . Urinary tract infection   . Kidney stone   . Hydronephrosis   . Complication of anesthesia   . PONV (postoperative nausea and vomiting)   . Dysrhythmia   . Peripheral vascular disease ([place])   . CHF (congestive heart failure) ([place])   . Sleep apnea   . Shortness of breath   . Depression   . Hypernatremia     resolved  . Anemia   . Thrombocytopenia ([place])     resolved  . Subclinical hypothyroidism   . Thoracic aortic aneurysm ([place])   . Obesity hypoventilation syndrome ([place])   . Hypertension     pulmonary hypotension secondary to chronic heart/lung disease  . Acute encephalopathy     resolved  . Transient hypotension     resolved with fluids  . COPD (chronic obstructive pulmonary disease) ([place])   . Nephrolithiasis     Past Surgical History  Procedure Laterality Date  . Coronary stent placement    . Appendectomy    . Tonsillectomy    . Knee surgery    . Cystoscopy w/ ureteral stent placement Right 04/04/2013    Procedure: CYSTOSCOPY WITH RETROGRADE PYELOGRAM/URETERAL STENT PLACEMENT;  Surgeon: Molli Hazard, MD;  Location: [facility];  Service: Urology;  Laterality: Right;  . Cystoscopy with retrograde pyelogram, ureteroscopy and stent placement Right 04/30/2013    Procedure: CYSTOSCOPY WITH RETROGRADE PYELOGRAM, URETEROSCOPY AND STENT EXCHANGE;  Surgeon: Molli Hazard, MD;  Location: [facility];  Service: Urology;  Laterality: Right;  . Holmium laser application Right [id number]    Procedure: HOLMIUM LASER APPLICATION;  Surgeon: Molli Hazard, MD;  Location: [facility];  Service: Urology;  Laterality: Right;    Family History  Problem Relation Age of Onset  . Adopted: Yes  . Heart attack Father   . Diabetes Mother     Social History   Social History  . Marital Status: Married    Spouse Name: N/A  . Number of Children: N/A  . Years of Education:  N/A   Social History Main Topics  . Smoking status: Passive Smoke Exposure - Never Smoker  . Smokeless tobacco: Never Used     Comment: Father smoked cigars.  . Alcohol Use: No  . Drug Use: No  . Sexual Activity: Not Asked   Other Topics Concern  . None   Social History Narrative   Originally from [place]. Always lived in [place]. No internation travel. Always a homemaker. Remote bird exposure.  Objective:   Physical Exam BP 140/64 mmHg  Pulse 68  Ht 5\' 3"  (1.6 m)  Wt 195 lb (88.451 kg)  BMI 34.55 kg/m2  SpO2 90% General:  Awake. Alert. No distress. Obese.  Integument:  Warm & dry. No rash on exposed skin.  HEENT:  Moist mucus membranes. No oral ulcers. No scleral injection or icterus. Mild left nasal turbinate swelling. Cardiovascular:  Regular rate. Normal S1 & S2. Bilateral lower extremity edema. Pulmonary:  Slightly decreased breath sounds bilateral lung bases. Normal work of breathing on room air. Speaking in complete sentences. Abdomen: Soft. Normal bowel sounds. Protuberant. Musculoskeletal:  Normal bulk and tone. No joint deformity or effusion appreciated.  PFT 08/23/15: FVC 0.87 L (38%) FEV1 0.62 L (38%) FEV1/FVC 0.72 FEF 25-75 0.42 L (37%) no bronchodilator response 09/16/13: FVC 1.15 L (49%) FEV1 0.77 L (45%) FEV1/FVC 0.67 FEF 25-75 0.40 L (32%) no bronchodilator response DLCO uncorrected 44%  6MWT 09/28/15: Walked 3/4 Lap / Baseline Sat 83% on RA (required 1 L/m at rest & 4 L/m with exertion to maintain)  CARDIAC TTE (08/01/13): LV normal in size. EF 50-55%. Hypokinesis of basal mid inferior myocardium. Grade 1 diastolic dysfunction. LA & RA normal in size. RV mildly dilated with normal systolic function. Pulmonary artery poorly visualized. Main pulmonary artery normal in size. Aorta and nondilated. No aortic stenosis with trivial regurgitation. No mitral stenosis or regurgitation. No pulmonic regurgitation. Trivial tricuspid regurgitation. No pericardial  effusion.  LABS 08/04/15 CBC: 6.7/14.9/47.9/96  09/18/13 ABG on 2 L/m:  7.35/41/62/90%  08/11/13 ABG on RA: 7.44/47/70    Assessment & Plan:  82 year old female with chronic hypoxic respiratory failure, obesity hypoventilation syndrome, and severe COPD based on spirometry from 2015. Given her ongoing problems with dyspnea I suspect she may benefit from additional inhaler therapy. However, I'm holding on initiating additional inhalers until the patient returns to clinic to review her test results. She does have significant oxygen requirement at rest as well as with exertion. Targeting saturation 88-94%. I instructed the patient to notify my office if she had any new breathing problems or questions before her next appointment.  1. Severe COPD:  Screening for alpha-1 antitrypsin today. Checking full pulmonary function testing on or before next appointment. Continuing albuterol inhaler as needed. 2. OHS w/ Cor Pulmonale: Continuing noninvasive positive pressure ventilation. Checking transthoracic echocardiogram. 3. Chronic Hypoxic Respiratory Failure: Continuing patient on oxygen at 1 L/m at rest & 4 L/m with exertion. Plan for home titration on her portable concentrator. Checking chest x-ray PA/LAT. 4. Follow-up: Return to clinic in 4 weeks.  Sonia Baller Ashok Cordia, M.D. [facility] Pulmonary [facility] Critical [facility] Pager:  [phone] After 3pm or if no response, call [phone] 4:33 PM 09/28/2015

## 2015-09-28 NOTE — Patient Instructions (Addendum)
   Continue using your Trilogy.  Call me if you have any new breathing problems at your next appointment.  We will review your test results at your follow-up appointment.  TESTS ORDERED: 1. Full pulmonary function testing on or before next appointment 2. Alpha-1 antitrypsin phenotype today 3. Complete Echocardiogram 4. CXR PA/LAT TODAY

## 2015-09-28 NOTE — Telephone Encounter (Signed)
PFT 08/23/15: FVC 0.87 L (38%) FEV1 0.16 L (38%) FEV1/FVC 0.72 FEF 25-75 0.42 L (37%) no bronchodilator response Or 09/16/13: FVC 1.15 L (49%) FEV1 0.77 L (45%) FEV1/FVC 0.67 FEF 25-75 0.40 L (32%) no bronchodilator response DLCO uncorrected 44%  CARDIAC TTE (08/01/13): LV normal in size. EF 50-55%. Hypokinesis of basal mid inferior myocardium. Grade 1 diastolic dysfunction. LA & RA normal in size. RV mildly dilated with normal systolic function. Pulmonary artery poorly visualized. Main pulmonary artery normal in size. Aorta and nondilated. No aortic stenosis with trivial regurgitation. No mitral stenosis or regurgitation. No pulmonic regurgitation. Trivial tricuspid regurgitation. No pericardial effusion.  LABS 08/04/15 CBC: 6.7/14.9/47.9/96

## 2015-09-30 ENCOUNTER — Ambulatory Visit (HOSPITAL_COMMUNITY): Payer: Medicare Other | Attending: Cardiovascular Disease

## 2015-09-30 ENCOUNTER — Other Ambulatory Visit: Payer: Self-pay

## 2015-09-30 DIAGNOSIS — I517 Cardiomegaly: Secondary | ICD-10-CM | POA: Insufficient documentation

## 2015-09-30 DIAGNOSIS — I2510 Atherosclerotic heart disease of native coronary artery without angina pectoris: Secondary | ICD-10-CM | POA: Insufficient documentation

## 2015-09-30 DIAGNOSIS — R0600 Dyspnea, unspecified: Secondary | ICD-10-CM | POA: Diagnosis not present

## 2015-09-30 DIAGNOSIS — I509 Heart failure, unspecified: Secondary | ICD-10-CM | POA: Insufficient documentation

## 2015-09-30 DIAGNOSIS — I351 Nonrheumatic aortic (valve) insufficiency: Secondary | ICD-10-CM | POA: Diagnosis not present

## 2015-09-30 DIAGNOSIS — Z6834 Body mass index (BMI) 34.0-34.9, adult: Secondary | ICD-10-CM | POA: Insufficient documentation

## 2015-09-30 DIAGNOSIS — I340 Nonrheumatic mitral (valve) insufficiency: Secondary | ICD-10-CM | POA: Diagnosis not present

## 2015-09-30 DIAGNOSIS — J449 Chronic obstructive pulmonary disease, unspecified: Secondary | ICD-10-CM | POA: Diagnosis not present

## 2015-09-30 DIAGNOSIS — G4730 Sleep apnea, unspecified: Secondary | ICD-10-CM | POA: Diagnosis not present

## 2015-09-30 LAB — ECHOCARDIOGRAM COMPLETE
E decel time: 254 msec
EERAT: 18.59
FS: 27 % — AB (ref 28–44)
IV/PV OW: 0.99
LA diam end sys: 53 mm
LA vol index: 62.8 mL/m2
LA vol: 120 mL
LADIAMINDEX: 2.77 cm/m2
LASIZE: 53 mm
LAVOLA4C: 106 mL
LV E/e' medial: 18.59
LV TDI E'MEDIAL: 3.5
LVEEAVG: 18.59
LVELAT: 4.89 cm/s
LVOT VTI: 19.5 cm
LVOT area: 3.8 cm2
LVOT diameter: 22 mm
LVOT peak grad rest: 3 mmHg
LVOT peak vel: 91 cm/s
LVOTSV: 74 mL
MV Dec: 254
MV pk A vel: 56.7 m/s
MVPG: 3 mmHg
MVPKEVEL: 90.9 m/s
P 1/2 time: 570 ms
PW: 11.1 mm — AB (ref 0.6–1.1)
Reg peak vel: 359 cm/s
TDI e' lateral: 4.89
TR max vel: 359 cm/s

## 2015-10-03 LAB — ALPHA-1 ANTITRYPSIN PHENOTYPE: A-1 Antitrypsin: 228 mg/dL — ABNORMAL HIGH (ref 83–199)

## 2015-10-07 ENCOUNTER — Telehealth: Payer: Self-pay | Admitting: Pulmonary Disease

## 2015-10-07 NOTE — Telephone Encounter (Signed)
Called spoke with pt. She states that she has no received her POC. I explained to her that [employer] is who we faxed the order too. She states that they have already come to her house and that she will contact there office. I offered to call [employer] but pt refused stating she would since they had already come out to her house. She voiced understanding and had no further questions.

## 2015-10-07 NOTE — Telephone Encounter (Signed)
Patient calling back, said no one has tried to call her. Please call [phone]

## 2015-10-07 NOTE — Telephone Encounter (Signed)
LMTCB x 1 

## 2015-10-22 ENCOUNTER — Emergency Department (HOSPITAL_BASED_OUTPATIENT_CLINIC_OR_DEPARTMENT_OTHER): Payer: Medicare Other

## 2015-10-22 ENCOUNTER — Emergency Department (HOSPITAL_BASED_OUTPATIENT_CLINIC_OR_DEPARTMENT_OTHER)
Admission: EM | Admit: 2015-10-22 | Discharge: 2015-10-22 | Disposition: A | Discharge status: Home / Self Care | Payer: Medicare Other | Attending: Emergency Medicine | Admitting: Emergency Medicine

## 2015-10-22 ENCOUNTER — Encounter (HOSPITAL_BASED_OUTPATIENT_CLINIC_OR_DEPARTMENT_OTHER): Payer: Self-pay | Admitting: Emergency Medicine

## 2015-10-22 DIAGNOSIS — S0083XA Contusion of other part of head, initial encounter: Secondary | ICD-10-CM | POA: Insufficient documentation

## 2015-10-22 DIAGNOSIS — Y939 Activity, unspecified: Secondary | ICD-10-CM | POA: Insufficient documentation

## 2015-10-22 DIAGNOSIS — Z7722 Contact with and (suspected) exposure to environmental tobacco smoke (acute) (chronic): Secondary | ICD-10-CM | POA: Diagnosis not present

## 2015-10-22 DIAGNOSIS — I2510 Atherosclerotic heart disease of native coronary artery without angina pectoris: Secondary | ICD-10-CM | POA: Diagnosis not present

## 2015-10-22 DIAGNOSIS — W07XXXA Fall from chair, initial encounter: Secondary | ICD-10-CM | POA: Diagnosis not present

## 2015-10-22 DIAGNOSIS — I252 Old myocardial infarction: Secondary | ICD-10-CM | POA: Diagnosis not present

## 2015-10-22 DIAGNOSIS — F329 Major depressive disorder, single episode, unspecified: Secondary | ICD-10-CM | POA: Diagnosis not present

## 2015-10-22 DIAGNOSIS — I739 Peripheral vascular disease, unspecified: Secondary | ICD-10-CM | POA: Insufficient documentation

## 2015-10-22 DIAGNOSIS — I110 Hypertensive heart disease with heart failure: Secondary | ICD-10-CM | POA: Diagnosis not present

## 2015-10-22 DIAGNOSIS — Y929 Unspecified place or not applicable: Secondary | ICD-10-CM | POA: Insufficient documentation

## 2015-10-22 DIAGNOSIS — Y999 Unspecified external cause status: Secondary | ICD-10-CM | POA: Diagnosis not present

## 2015-10-22 DIAGNOSIS — J449 Chronic obstructive pulmonary disease, unspecified: Secondary | ICD-10-CM | POA: Insufficient documentation

## 2015-10-22 DIAGNOSIS — S0990XA Unspecified injury of head, initial encounter: Secondary | ICD-10-CM | POA: Insufficient documentation

## 2015-10-22 DIAGNOSIS — S20212A Contusion of left front wall of thorax, initial encounter: Secondary | ICD-10-CM | POA: Insufficient documentation

## 2015-10-22 DIAGNOSIS — J45909 Unspecified asthma, uncomplicated: Secondary | ICD-10-CM | POA: Diagnosis not present

## 2015-10-22 DIAGNOSIS — Z79899 Other long term (current) drug therapy: Secondary | ICD-10-CM | POA: Insufficient documentation

## 2015-10-22 DIAGNOSIS — W19XXXA Unspecified fall, initial encounter: Secondary | ICD-10-CM

## 2015-10-22 DIAGNOSIS — I509 Heart failure, unspecified: Secondary | ICD-10-CM | POA: Insufficient documentation

## 2015-10-22 DIAGNOSIS — Z7982 Long term (current) use of aspirin: Secondary | ICD-10-CM | POA: Diagnosis not present

## 2015-10-22 NOTE — ED Notes (Signed)
Pt fell forward out of a lift chair yesterday morning around 5:00. Pt has large contusion to forehead, bilateral bruising and swelling to eyes. Pt takes blood thinners. Denies LOC.

## 2015-10-22 NOTE — ED Notes (Signed)
Patient states that she fell and hit her head to her left forehead. Since she has had bilateral eye bruising

## 2015-10-22 NOTE — Discharge Instructions (Signed)
CT of the head and face without any acute bony injuries or brain injury.

## 2015-10-22 NOTE — ED Provider Notes (Signed)
CSN: [id number]     Arrival date & time 10/22/15  1728 History  By signing my name below, I, Terrance Branch, attest that this documentation has been prepared under the direction and in the presence of Fredia Sorrow, MD. Electronically Signed: Randa Evens, ED Scribe. 10/22/2015. 6:20 PM.      Chief Complaint  Patient presents with  . Facial Swelling   The history is provided by the patient. No language interpreter was used.   HPI Comments: Laura [name] is a 82 y.o. female who presents to the Emergency Department complaining of fall 1 day prior at 5 AM. Pt states that she was sitting in a lift chair and accidental hit the lift button that made her face plant on a glass table. Pt states she initially hit her left forehead and had an associated hematoma on the forehead. Pt states that today she has associated periorbital swelling and bruising. Pt states she has applied ice with no relief.  Pt denies any anticoagulant use but states that she does take daily baby aspirin. Pt denies any other injury. Pt denies LOC, eye injury or other related symptoms. Pt states that her tetanus is UTD.     Past Medical History  Diagnosis Date  . Coronary artery disease   . Myocardial infarction ([place])   . Arthritis   . Asthma   . Chronic bronchitis   . Urinary tract infection   . Kidney stone   . Hydronephrosis   . Complication of anesthesia   . PONV (postoperative nausea and vomiting)   . Dysrhythmia   . Peripheral vascular disease ([place])   . CHF (congestive heart failure) ([place])   . Sleep apnea   . Shortness of breath   . Depression   . Hypernatremia     resolved  . Anemia   . Thrombocytopenia ([place])     resolved  . Subclinical hypothyroidism   . Thoracic aortic aneurysm ([place])   . Obesity hypoventilation syndrome ([place])   . Hypertension     pulmonary hypotension secondary to chronic heart/lung disease  . Acute encephalopathy     resolved  . Transient hypotension     resolved with  fluids  . COPD (chronic obstructive pulmonary disease) ([place])   . Nephrolithiasis    Past Surgical History  Procedure Laterality Date  . Coronary stent placement    . Appendectomy    . Tonsillectomy    . Knee surgery    . Cystoscopy w/ ureteral stent placement Right 04/04/2013    Procedure: CYSTOSCOPY WITH RETROGRADE PYELOGRAM/URETERAL STENT PLACEMENT;  Surgeon: Molli Hazard, MD;  Location: [facility];  Service: Urology;  Laterality: Right;  . Cystoscopy with retrograde pyelogram, ureteroscopy and stent placement Right 04/30/2013    Procedure: CYSTOSCOPY WITH RETROGRADE PYELOGRAM, URETEROSCOPY AND STENT EXCHANGE;  Surgeon: Molli Hazard, MD;  Location: [facility];  Service: Urology;  Laterality: Right;  . Holmium laser application Right [id number]    Procedure: HOLMIUM LASER APPLICATION;  Surgeon: Molli Hazard, MD;  Location: [facility];  Service: Urology;  Laterality: Right;   Family History  Problem Relation Age of Onset  . Adopted: Yes  . Heart attack Father   . Diabetes Mother    Social History  Substance Use Topics  . Smoking status: Passive Smoke Exposure - Never Smoker  . Smokeless tobacco: Never Used     Comment: Father smoked cigars.  . Alcohol Use: No   OB History    No data available  Review of Systems  Constitutional: Negative for fever and chills.  HENT: Positive for facial swelling. Negative for rhinorrhea and sore throat.   Eyes: Negative for visual disturbance.  Respiratory: Negative for cough and shortness of breath.   Cardiovascular: Negative for chest pain and leg swelling.  Gastrointestinal: Negative for nausea, vomiting, abdominal pain and diarrhea.  Genitourinary: Negative for dysuria.  Musculoskeletal: Negative for back pain and neck pain.  Skin: Positive for wound. Negative for rash.  Neurological: Negative for syncope and headaches.  Hematological: Does not bruise/bleed easily.  Psychiatric/Behavioral: Negative for confusion.       Allergies  Codeine; Hydrocodone; and Morphine and related  Home Medications   Prior to Admission medications   Medication Sig Start Date End Date Taking? Authorizing Provider  acetaminophen (TYLENOL) 500 MG tablet Take 500 mg by mouth every 6 (six) hours as needed for mild pain.    Historical Provider, MD  albuterol (PROVENTIL HFA;VENTOLIN HFA) 108 (90 BASE) MCG/ACT inhaler Inhale 2 puffs into the lungs every 4 (four) hours as needed for wheezing (cough).     Historical Provider, MD  allopurinol (ZYLOPRIM) 100 MG tablet Take 100 mg by mouth daily as needed (gout).    Historical Provider, MD  amLODipine (NORVASC) 5 MG tablet Take 1 tablet (5 mg total) by mouth daily. 08/11/13   Erick Colace, NP  aspirin 81 MG tablet Take 81 mg by mouth at bedtime.     Historical Provider, MD  atorvastatin (LIPITOR) 40 MG tablet Take 40 mg by mouth daily. 07/20/15   Historical Provider, MD  colchicine 0.6 MG tablet Take 0.6 mg by mouth daily as needed (gout.).    Historical Provider, MD  Cyanocobalamin (VITAMIN B 12 PO) Take 1,000 mcg by mouth daily.    Historical Provider, MD  denosumab (PROLIA) 60 MG/ML SOLN injection Inject 60 mg into the skin every 6 (six) months. Administer in upper arm, thigh, or abdomen    Historical Provider, MD  dextromethorphan-guaiFENesin (MUCINEX DM) 30-600 MG 12hr tablet Take 1 tablet by mouth daily as needed for cough.    Historical Provider, MD  ezetimibe (ZETIA) 10 MG tablet Take 10 mg by mouth daily.     Historical Provider, MD  ferrous sulfate 325 (65 FE) MG tablet Take 325 mg by mouth daily with breakfast.    Historical Provider, MD  fluticasone (FLOVENT HFA) 110 MCG/ACT inhaler Inhale 2 puffs into the lungs 2 (two) times daily. 07/13/15   Magdalen Spatz, NP  furosemide (LASIX) 40 MG tablet Take 20 mg by mouth. On every third or fourth day.    Historical Provider, MD  metoprolol tartrate (LOPRESSOR) 25 MG tablet Take 25 mg by mouth 2 (two) times daily. For HTN 08/11/13    Erick Colace, NP  montelukast (SINGULAIR) 10 MG tablet Take 10 mg by mouth every morning. For COPD/Asthma    Historical Provider, MD  nitroGLYCERIN (NITROSTAT) 0.4 MG SL tablet Place 0.4 mg under the tongue every 5 (five) minutes as needed for chest pain.    Historical Provider, MD  NON FORMULARY at bedtime. CPAP    Historical Provider, MD  ondansetron (ZOFRAN-ODT) 8 MG disintegrating tablet Take 8 mg by mouth every 8 (eight) hours as needed for nausea.  03/31/13   Historical Provider, MD  pantoprazole (PROTONIX) 40 MG tablet Take 40 mg by mouth daily. For GERD 08/11/13   Erick Colace, NP  potassium chloride (K-DUR,KLOR-CON) 10 MEQ tablet Take 10 mEq by mouth daily. On days  she takes furosemide only.    Historical Provider, MD  Probiotic Product (PROBIOTIC PO) Take 10 mg by mouth daily.    Historical Provider, MD  sertraline (ZOLOFT) 50 MG tablet Take 50 mg by mouth daily with breakfast. For depression 04/03/13   Historical Provider, MD  tamsulosin (FLOMAX) 0.4 MG CAPS capsule Take 0.4 mg by mouth daily.  04/07/13   Orson Eva, MD  Vitamin D, Ergocalciferol, (DRISDOL) 50000 UNITS CAPS capsule Take 50,000 Units by mouth every 7 (seven) days. For Vitamin D    Historical Provider, MD   BP 169/94 mmHg  Pulse 78  Temp(Src) 98.8 F (37.1 C) (Oral)  Resp 20  Ht 5\' 3"  (1.6 m)  Wt 189 lb (85.73 kg)  BMI 33.49 kg/m2  SpO2 100%    Physical Exam  Constitutional: She is oriented to person, place, and time. She appears well-developed and well-nourished. No distress.  HENT:  Left forehead 5x3 cm hematoma with 1 cm wound and scabbing, regular bruising under skin above eyes and both lower lids.   Eyes: Conjunctivae and EOM are normal.  Slit lamp exam:      The right eye shows no hyphema.       The left eye shows no hyphema.  Neck: Neck supple. No tracheal deviation present.  Cardiovascular: Normal rate, regular rhythm and normal heart sounds.   Pulmonary/Chest: Effort normal and breath sounds  normal. No respiratory distress. She has no wheezes. She has no rales.  Bruising on left anterior chest.   Abdominal: Bowel sounds are normal. There is no tenderness.  Musculoskeletal: Normal range of motion. She exhibits no edema.  Neurological: She is alert and oriented to person, place, and time. No cranial nerve deficit.  Skin: Skin is warm and dry.  Psychiatric: She has a normal mood and affect. Her behavior is normal.  Nursing note and vitals reviewed.   ED Course  Procedures (including critical care time) DIAGNOSTIC STUDIES: Oxygen Saturation is 97% on RA, normal by my interpretation.    COORDINATION OF CARE: 6:15 PM-Discussed treatment plan which includes CT scan with pt at bedside and pt agreed to plan.     Labs Review Labs Reviewed - No data to display  Imaging Review Ct Head Wo Contrast  10/22/2015  CLINICAL DATA:  82 year old female with head and face injury with headache and facial pain following fall yesterday. Initial encounter. EXAM: CT HEAD WITHOUT CONTRAST CT MAXILLOFACIAL WITHOUT CONTRAST TECHNIQUE: Multidetector CT imaging of the head and maxillofacial structures were performed using the standard protocol without intravenous contrast. Multiplanar CT image reconstructions of the maxillofacial structures were also generated. COMPARISON:  None. FINDINGS: CT HEAD FINDINGS Atrophy and mild probable chronic small-vessel white matter ischemic changes noted. No acute intracranial abnormalities are identified, including mass lesion or mass effect, hydrocephalus, extra-axial fluid collection, midline shift, hemorrhage, or acute infarction. Left forehead soft tissue swelling/ hematoma noted. The visualized bony calvarium is unremarkable. CT MAXILLOFACIAL FINDINGS Motion artifact decreases sensitivity. No definite fracture, subluxation or dislocation. The paranasal sinuses, mastoid air cells and middle/inner ears are clear. The orbits and globes are grossly unremarkable. Left  forehead soft tissue swelling/hematoma noted. IMPRESSION: Left forehead soft tissue swelling/ hematoma without fracture. No evidence of acute intracranial abnormality. Atrophy and probable chronic small-vessel ischemic changes. No evidence of facial fracture although motion artifact decreases sensitivity Electronically Signed   By: Margarette Canada M.D.   On: 10/22/2015 19:31   Ct Maxillofacial Wo Cm  10/22/2015  CLINICAL DATA:  82 year old female  with head and face injury with headache and facial pain following fall yesterday. Initial encounter. EXAM: CT HEAD WITHOUT CONTRAST CT MAXILLOFACIAL WITHOUT CONTRAST TECHNIQUE: Multidetector CT imaging of the head and maxillofacial structures were performed using the standard protocol without intravenous contrast. Multiplanar CT image reconstructions of the maxillofacial structures were also generated. COMPARISON:  None. FINDINGS: CT HEAD FINDINGS Atrophy and mild probable chronic small-vessel white matter ischemic changes noted. No acute intracranial abnormalities are identified, including mass lesion or mass effect, hydrocephalus, extra-axial fluid collection, midline shift, hemorrhage, or acute infarction. Left forehead soft tissue swelling/ hematoma noted. The visualized bony calvarium is unremarkable. CT MAXILLOFACIAL FINDINGS Motion artifact decreases sensitivity. No definite fracture, subluxation or dislocation. The paranasal sinuses, mastoid air cells and middle/inner ears are clear. The orbits and globes are grossly unremarkable. Left forehead soft tissue swelling/hematoma noted. IMPRESSION: Left forehead soft tissue swelling/ hematoma without fracture. No evidence of acute intracranial abnormality. Atrophy and probable chronic small-vessel ischemic changes. No evidence of facial fracture although motion artifact decreases sensitivity Electronically Signed   By: Margarette Canada M.D.   On: 10/22/2015 19:31      EKG Interpretation None      MDM   Final  diagnoses:  Fall, initial encounter  Head injury, initial encounter   Patient with a fall CT head and face without any brain or facial bone or skull bone injuries. Patient was significant bilateral raccoon eyes all due to blood draining from up on the for head wound. Patient not on blood thinners. Patient otherwise without any complaints. Patient stable for discharge home.  I personally performed the services described in this documentation, which was scribed in my presence. The recorded information has been reviewed and is accurate.        Fredia Sorrow, MD 10/22/15 1956

## 2015-10-22 NOTE — ED Notes (Signed)
Pt given d/c instructions as per chart. Verbalizes understanding. No questions. Dr. Rogene Houston in to talk with pt and husband.

## 2015-11-11 ENCOUNTER — Encounter (HOSPITAL_BASED_OUTPATIENT_CLINIC_OR_DEPARTMENT_OTHER): Payer: Self-pay | Admitting: *Deleted

## 2015-11-11 ENCOUNTER — Inpatient Hospital Stay (HOSPITAL_BASED_OUTPATIENT_CLINIC_OR_DEPARTMENT_OTHER)
Admission: EM | Admit: 2015-11-11 | Discharge: 2015-12-09 | DRG: 085 | Disposition: E | Payer: Medicare Other | Attending: Internal Medicine | Admitting: Internal Medicine

## 2015-11-11 ENCOUNTER — Emergency Department (HOSPITAL_BASED_OUTPATIENT_CLINIC_OR_DEPARTMENT_OTHER): Payer: Medicare Other

## 2015-11-11 DIAGNOSIS — I5033 Acute on chronic diastolic (congestive) heart failure: Secondary | ICD-10-CM | POA: Diagnosis not present

## 2015-11-11 DIAGNOSIS — W19XXXA Unspecified fall, initial encounter: Secondary | ICD-10-CM | POA: Diagnosis present

## 2015-11-11 DIAGNOSIS — Z515 Encounter for palliative care: Secondary | ICD-10-CM | POA: Diagnosis not present

## 2015-11-11 DIAGNOSIS — G936 Cerebral edema: Secondary | ICD-10-CM | POA: Diagnosis present

## 2015-11-11 DIAGNOSIS — I6200 Nontraumatic subdural hemorrhage, unspecified: Secondary | ICD-10-CM | POA: Diagnosis not present

## 2015-11-11 DIAGNOSIS — D696 Thrombocytopenia, unspecified: Secondary | ICD-10-CM | POA: Diagnosis not present

## 2015-11-11 DIAGNOSIS — J449 Chronic obstructive pulmonary disease, unspecified: Secondary | ICD-10-CM | POA: Diagnosis present

## 2015-11-11 DIAGNOSIS — E872 Acidosis: Secondary | ICD-10-CM | POA: Diagnosis present

## 2015-11-11 DIAGNOSIS — I471 Supraventricular tachycardia, unspecified: Secondary | ICD-10-CM | POA: Diagnosis present

## 2015-11-11 DIAGNOSIS — Z8249 Family history of ischemic heart disease and other diseases of the circulatory system: Secondary | ICD-10-CM

## 2015-11-11 DIAGNOSIS — F329 Major depressive disorder, single episode, unspecified: Secondary | ICD-10-CM | POA: Diagnosis present

## 2015-11-11 DIAGNOSIS — E162 Hypoglycemia, unspecified: Secondary | ICD-10-CM | POA: Diagnosis present

## 2015-11-11 DIAGNOSIS — I472 Ventricular tachycardia, unspecified: Secondary | ICD-10-CM

## 2015-11-11 DIAGNOSIS — S065X0A Traumatic subdural hemorrhage without loss of consciousness, initial encounter: Principal | ICD-10-CM | POA: Diagnosis present

## 2015-11-11 DIAGNOSIS — E662 Morbid (severe) obesity with alveolar hypoventilation: Secondary | ICD-10-CM | POA: Diagnosis present

## 2015-11-11 DIAGNOSIS — I248 Other forms of acute ischemic heart disease: Secondary | ICD-10-CM | POA: Diagnosis present

## 2015-11-11 DIAGNOSIS — R269 Unspecified abnormalities of gait and mobility: Secondary | ICD-10-CM

## 2015-11-11 DIAGNOSIS — I2510 Atherosclerotic heart disease of native coronary artery without angina pectoris: Secondary | ICD-10-CM | POA: Diagnosis present

## 2015-11-11 DIAGNOSIS — I10 Essential (primary) hypertension: Secondary | ICD-10-CM | POA: Diagnosis not present

## 2015-11-11 DIAGNOSIS — I252 Old myocardial infarction: Secondary | ICD-10-CM | POA: Diagnosis not present

## 2015-11-11 DIAGNOSIS — Z7982 Long term (current) use of aspirin: Secondary | ICD-10-CM

## 2015-11-11 DIAGNOSIS — G4733 Obstructive sleep apnea (adult) (pediatric): Secondary | ICD-10-CM | POA: Diagnosis not present

## 2015-11-11 DIAGNOSIS — R451 Restlessness and agitation: Secondary | ICD-10-CM | POA: Diagnosis not present

## 2015-11-11 DIAGNOSIS — J9601 Acute respiratory failure with hypoxia: Secondary | ICD-10-CM | POA: Diagnosis not present

## 2015-11-11 DIAGNOSIS — J9621 Acute and chronic respiratory failure with hypoxia: Secondary | ICD-10-CM | POA: Diagnosis not present

## 2015-11-11 DIAGNOSIS — N179 Acute kidney failure, unspecified: Secondary | ICD-10-CM | POA: Diagnosis present

## 2015-11-11 DIAGNOSIS — G935 Compression of brain: Secondary | ICD-10-CM | POA: Diagnosis not present

## 2015-11-11 DIAGNOSIS — J9602 Acute respiratory failure with hypercapnia: Secondary | ICD-10-CM | POA: Diagnosis not present

## 2015-11-11 DIAGNOSIS — S065XAA Traumatic subdural hemorrhage with loss of consciousness status unknown, initial encounter: Secondary | ICD-10-CM

## 2015-11-11 DIAGNOSIS — Z9981 Dependence on supplemental oxygen: Secondary | ICD-10-CM

## 2015-11-11 DIAGNOSIS — Z978 Presence of other specified devices: Secondary | ICD-10-CM

## 2015-11-11 DIAGNOSIS — Z66 Do not resuscitate: Secondary | ICD-10-CM | POA: Diagnosis present

## 2015-11-11 DIAGNOSIS — W1789XA Other fall from one level to another, initial encounter: Secondary | ICD-10-CM | POA: Diagnosis present

## 2015-11-11 DIAGNOSIS — E785 Hyperlipidemia, unspecified: Secondary | ICD-10-CM | POA: Diagnosis present

## 2015-11-11 DIAGNOSIS — S065X9A Traumatic subdural hemorrhage with loss of consciousness of unspecified duration, initial encounter: Secondary | ICD-10-CM

## 2015-11-11 DIAGNOSIS — R778 Other specified abnormalities of plasma proteins: Secondary | ICD-10-CM | POA: Diagnosis present

## 2015-11-11 DIAGNOSIS — I712 Thoracic aortic aneurysm, without rupture: Secondary | ICD-10-CM | POA: Diagnosis not present

## 2015-11-11 DIAGNOSIS — I739 Peripheral vascular disease, unspecified: Secondary | ICD-10-CM | POA: Diagnosis present

## 2015-11-11 DIAGNOSIS — J9620 Acute and chronic respiratory failure, unspecified whether with hypoxia or hypercapnia: Secondary | ICD-10-CM | POA: Diagnosis not present

## 2015-11-11 DIAGNOSIS — R0682 Tachypnea, not elsewhere classified: Secondary | ICD-10-CM | POA: Diagnosis present

## 2015-11-11 DIAGNOSIS — R0602 Shortness of breath: Secondary | ICD-10-CM | POA: Diagnosis not present

## 2015-11-11 DIAGNOSIS — E873 Alkalosis: Secondary | ICD-10-CM | POA: Diagnosis not present

## 2015-11-11 DIAGNOSIS — R0600 Dyspnea, unspecified: Secondary | ICD-10-CM | POA: Diagnosis not present

## 2015-11-11 DIAGNOSIS — R7989 Other specified abnormal findings of blood chemistry: Secondary | ICD-10-CM | POA: Diagnosis present

## 2015-11-11 DIAGNOSIS — Z7722 Contact with and (suspected) exposure to environmental tobacco smoke (acute) (chronic): Secondary | ICD-10-CM | POA: Diagnosis present

## 2015-11-11 DIAGNOSIS — J9690 Respiratory failure, unspecified, unspecified whether with hypoxia or hypercapnia: Secondary | ICD-10-CM

## 2015-11-11 DIAGNOSIS — Z7189 Other specified counseling: Secondary | ICD-10-CM | POA: Diagnosis not present

## 2015-11-11 DIAGNOSIS — J9600 Acute respiratory failure, unspecified whether with hypoxia or hypercapnia: Secondary | ICD-10-CM | POA: Diagnosis not present

## 2015-11-11 DIAGNOSIS — Z4659 Encounter for fitting and adjustment of other gastrointestinal appliance and device: Secondary | ICD-10-CM

## 2015-11-11 DIAGNOSIS — J9622 Acute and chronic respiratory failure with hypercapnia: Secondary | ICD-10-CM | POA: Diagnosis not present

## 2015-11-11 DIAGNOSIS — Z885 Allergy status to narcotic agent status: Secondary | ICD-10-CM

## 2015-11-11 DIAGNOSIS — K219 Gastro-esophageal reflux disease without esophagitis: Secondary | ICD-10-CM | POA: Diagnosis present

## 2015-11-11 DIAGNOSIS — G9340 Encephalopathy, unspecified: Secondary | ICD-10-CM | POA: Diagnosis present

## 2015-11-11 DIAGNOSIS — Z79899 Other long term (current) drug therapy: Secondary | ICD-10-CM

## 2015-11-11 DIAGNOSIS — E039 Hypothyroidism, unspecified: Secondary | ICD-10-CM | POA: Diagnosis present

## 2015-11-11 DIAGNOSIS — R296 Repeated falls: Secondary | ICD-10-CM | POA: Diagnosis present

## 2015-11-11 DIAGNOSIS — Z955 Presence of coronary angioplasty implant and graft: Secondary | ICD-10-CM

## 2015-11-11 DIAGNOSIS — I110 Hypertensive heart disease with heart failure: Secondary | ICD-10-CM | POA: Diagnosis present

## 2015-11-11 HISTORY — DX: Chronic diastolic (congestive) heart failure: I50.32

## 2015-11-11 LAB — BASIC METABOLIC PANEL
ANION GAP: 3 — AB (ref 5–15)
BUN: 21 mg/dL — AB (ref 6–20)
CALCIUM: 8.9 mg/dL (ref 8.9–10.3)
CO2: 39 mmol/L — AB (ref 22–32)
CREATININE: 0.69 mg/dL (ref 0.44–1.00)
Chloride: 96 mmol/L — ABNORMAL LOW (ref 101–111)
GFR calc Af Amer: >60 mL/min (ref >60)
GFR calc non Af Amer: >60 mL/min (ref >60)
GLUCOSE: 111 mg/dL — AB (ref 65–99)
Potassium: 4.5 mmol/L (ref 3.5–5.1)
Sodium: 138 mmol/L (ref 135–145)

## 2015-11-11 LAB — CBC WITH DIFFERENTIAL/PLATELET
BASOS ABS: 0 10*3/uL (ref 0.0–0.1)
Basophils Relative: 0 %
EOS PCT: 3 %
Eosinophils Absolute: 0.2 10*3/uL (ref 0.0–0.7)
HCT: 40.6 % (ref 36.0–46.0)
Hemoglobin: 12.1 g/dL (ref 12.0–15.0)
LYMPHS PCT: 13 %
Lymphs Abs: 0.8 10*3/uL (ref 0.7–4.0)
MCH: 29.9 pg (ref 26.0–34.0)
MCHC: 29.8 g/dL — AB (ref 30.0–36.0)
MCV: 100.2 fL — AB (ref 78.0–100.0)
MONO ABS: 0.6 10*3/uL (ref 0.1–1.0)
MONOS PCT: 9 %
Neutro Abs: 5 10*3/uL (ref 1.7–7.7)
Neutrophils Relative %: 75 %
PLATELETS: 103 10*3/uL — AB (ref 150–400)
RBC: 4.05 MIL/uL (ref 3.87–5.11)
RDW: 13.4 % (ref 11.5–15.5)
WBC: 6.6 10*3/uL (ref 4.0–10.5)

## 2015-11-11 LAB — I-STAT VENOUS BLOOD GAS, ED
ACID-BASE EXCESS: 9 mmol/L — AB (ref 0.0–2.0)
Bicarbonate: 39.3 mEq/L — ABNORMAL HIGH (ref 20.0–24.0)
O2 SAT: 86 %
PH VEN: 7.256 (ref 7.250–7.300)
TCO2: 42 mmol/L (ref 0–100)
pCO2, Ven: 88.1 mmHg (ref 45.0–50.0)
pO2, Ven: 62 mmHg — ABNORMAL HIGH (ref 31.0–45.0)

## 2015-11-11 LAB — PROTIME-INR
INR: 0.98
PROTHROMBIN TIME: 13 s (ref 11.4–15.2)

## 2015-11-11 LAB — APTT: aPTT: 40 seconds — ABNORMAL HIGH (ref 24–36)

## 2015-11-11 NOTE — ED Notes (Signed)
Nurse first-pt was able to stand/pivot to w/c-taken into ED WR via w/c with home O2 in place

## 2015-11-11 NOTE — ED Provider Notes (Signed)
[facility] Provider Note   CSN: [id number] Arrival date & time: 11/08/2015  2150  First Provider Contact:  First MD Initiated Contact with Patient 11/30/2015 1057     By signing my name below, I, Laura [name], attest that this documentation has been prepared under the direction and in the presence of Forde Dandy, MD . Electronically Signed: Evelene [name], Scribe. 11/25/2015. 11:13 PM.  History   Chief Complaint Chief Complaint  Patient presents with  . Fall   LEVEL 5 CAVEAT DUE TO ACUITY OF MEDICAL CONDITION  The history is provided by the patient, the spouse and a relative (daughter). No language interpreter was used.     HPI Comments:  Laura [name] is a 82 y.o. female who presents to the Emergency Department s/p fall 3 days ago. Per pt's husband pt was walking with her walker when she tried to sit down but the walker rolled and she fell backwards striking the back of her head. Family denies LOC and initially was normal and without complaints. Pt's husband notes pt has seemed sleepy and confused over past 2 days afterwards and is having difficulty swallowing and difficulty walking. No vomitng and nausea. Reports right sided headache. No alleviating factors noted. She denies focal weakness or numbness. Pt has a h/o CAD, CHF, HTN, HLD, COPD and wears 2.5L home O2; family also reports difficulty breathing. No blood thinners aside from baby aspirin.  Past Medical History:  Diagnosis Date  . Acute encephalopathy    resolved  . Anemia   . Arthritis   . Asthma   . CHF (congestive heart failure) ([place])   . Chronic bronchitis   . Complication of anesthesia   . COPD (chronic obstructive pulmonary disease) ([place])   . Coronary artery disease   . Depression   . Dysrhythmia   . Hydronephrosis   . Hypernatremia    resolved  . Hypertension    pulmonary hypotension secondary to chronic heart/lung disease  . Kidney stone   . Myocardial infarction ([place])   . Nephrolithiasis   .  Obesity hypoventilation syndrome ([place])   . Peripheral vascular disease ([place])   . PONV (postoperative nausea and vomiting)   . Shortness of breath   . Sleep apnea   . Subclinical hypothyroidism   . Thoracic aortic aneurysm ([place])   . Thrombocytopenia ([place])    resolved  . Transient hypotension    resolved with fluids  . Urinary tract infection     Patient Active Problem List   Diagnosis Date Noted  . SDH (subdural hematoma) ([place]) 11/09/2015  . COPD, severe ([place]) 09/28/2015  . Seasonal allergies 07/06/2015  . Iron deficiency anemia 04/26/2014  . Obesity hypoventilation syndrome ([place]) 09/04/2013  . Chronic respiratory failure ([place]) 09/04/2013  . GERD (gastroesophageal reflux disease) 08/21/2013  . COPD (chronic obstructive pulmonary disease) with emphysema ([place]) 08/15/2013  . Chronic diastolic heart failure ([place]) 08/05/2013  . Acute on chronic respiratory failure ([place]) 08/05/2013  . Obesity (BMI 30-39.9)   . Acute exacerbation of CHF (congestive heart failure) ([place]) 07/31/2013  . Depression 05/14/2013  . Extrinsic asthma, unspecified 04/10/2013  . Nephrolithiasis 04/04/2013  . Thoracic aneurysm 02/19/2012  . Hypoxemia 05/20/2011  . CAD (coronary artery disease) 05/20/2011  . Hyperlipidemia 05/20/2011  . Hypercarbia 05/20/2011  . Hypertensive heart disease     Past Surgical History:  Procedure Laterality Date  . APPENDECTOMY    . CORONARY STENT PLACEMENT    . CYSTOSCOPY W/ URETERAL STENT PLACEMENT Right 04/04/2013  Procedure: CYSTOSCOPY WITH RETROGRADE PYELOGRAM/URETERAL STENT PLACEMENT;  Surgeon: Molli Hazard, MD;  Location: [facility];  Service: Urology;  Laterality: Right;  . CYSTOSCOPY WITH RETROGRADE PYELOGRAM, URETEROSCOPY AND STENT PLACEMENT Right 04/30/2013   Procedure: CYSTOSCOPY WITH RETROGRADE PYELOGRAM, URETEROSCOPY AND STENT EXCHANGE;  Surgeon: Molli Hazard, MD;  Location: [facility];  Service: Urology;  Laterality: Right;  . HOLMIUM LASER APPLICATION  Right [id number]   Procedure: HOLMIUM LASER APPLICATION;  Surgeon: Molli Hazard, MD;  Location: [facility];  Service: Urology;  Laterality: Right;  . KNEE SURGERY    . TONSILLECTOMY      OB History    No data available       Home Medications    Prior to Admission medications   Medication Sig Start Date End Date Taking? Authorizing Provider  OXYGEN Inhale into the lungs.   Yes Historical Provider, MD  acetaminophen (TYLENOL) 500 MG tablet Take 500 mg by mouth every 6 (six) hours as needed for mild pain.    Historical Provider, MD  albuterol (PROVENTIL HFA;VENTOLIN HFA) 108 (90 BASE) MCG/ACT inhaler Inhale 2 puffs into the lungs every 4 (four) hours as needed for wheezing (cough).     Historical Provider, MD  allopurinol (ZYLOPRIM) 100 MG tablet Take 100 mg by mouth daily as needed (gout).    Historical Provider, MD  amLODipine (NORVASC) 5 MG tablet Take 1 tablet (5 mg total) by mouth daily. 08/11/13   Erick Colace, NP  aspirin 81 MG tablet Take 81 mg by mouth at bedtime.     Historical Provider, MD  atorvastatin (LIPITOR) 40 MG tablet Take 40 mg by mouth daily. 07/20/15   Historical Provider, MD  colchicine 0.6 MG tablet Take 0.6 mg by mouth daily as needed (gout.).    Historical Provider, MD  Cyanocobalamin (VITAMIN B 12 PO) Take 1,000 mcg by mouth daily.    Historical Provider, MD  denosumab (PROLIA) 60 MG/ML SOLN injection Inject 60 mg into the skin every 6 (six) months. Administer in upper arm, thigh, or abdomen    Historical Provider, MD  dextromethorphan-guaiFENesin (MUCINEX DM) 30-600 MG 12hr tablet Take 1 tablet by mouth daily as needed for cough.    Historical Provider, MD  ezetimibe (ZETIA) 10 MG tablet Take 10 mg by mouth daily.     Historical Provider, MD  ferrous sulfate 325 (65 FE) MG tablet Take 325 mg by mouth daily with breakfast.    Historical Provider, MD  fluticasone (FLOVENT HFA) 110 MCG/ACT inhaler Inhale 2 puffs into the lungs 2 (two) times daily. 07/13/15   Magdalen Spatz, NP  furosemide (LASIX) 40 MG tablet Take 20 mg by mouth. On every third or fourth day.    Historical Provider, MD  metoprolol tartrate (LOPRESSOR) 25 MG tablet Take 25 mg by mouth 2 (two) times daily. For HTN 08/11/13   Erick Colace, NP  montelukast (SINGULAIR) 10 MG tablet Take 10 mg by mouth every morning. For COPD/Asthma    Historical Provider, MD  nitroGLYCERIN (NITROSTAT) 0.4 MG SL tablet Place 0.4 mg under the tongue every 5 (five) minutes as needed for chest pain.    Historical Provider, MD  NON FORMULARY at bedtime. CPAP    Historical Provider, MD  ondansetron (ZOFRAN-ODT) 8 MG disintegrating tablet Take 8 mg by mouth every 8 (eight) hours as needed for nausea.  03/31/13   Historical Provider, MD  pantoprazole (PROTONIX) 40 MG tablet Take 40 mg by mouth daily. For GERD 08/11/13  Erick Colace, NP  potassium chloride (K-DUR,KLOR-CON) 10 MEQ tablet Take 10 mEq by mouth daily. On days she takes furosemide only.    Historical Provider, MD  Probiotic Product (PROBIOTIC PO) Take 10 mg by mouth daily.    Historical Provider, MD  sertraline (ZOLOFT) 50 MG tablet Take 50 mg by mouth daily with breakfast. For depression 04/03/13   Historical Provider, MD  tamsulosin (FLOMAX) 0.4 MG CAPS capsule Take 0.4 mg by mouth daily.  04/07/13   Orson Eva, MD  Vitamin D, Ergocalciferol, (DRISDOL) 50000 UNITS CAPS capsule Take 50,000 Units by mouth every 7 (seven) days. For Vitamin D    Historical Provider, MD    Family History Family History  Problem Relation Age of Onset  . Adopted: Yes  . Diabetes Mother   . Heart attack Father     Social History Social History  Substance Use Topics  . Smoking status: Passive Smoke Exposure - Never Smoker  . Smokeless tobacco: Never Used     Comment: Father smoked cigars.  . Alcohol use No     Allergies   Codeine; Hydrocodone; and Morphine and related   Review of Systems Review of Systems  Unable to perform ROS: Acuity of condition     Physical Exam Updated Vital Signs BP 160/87   Pulse 76   Temp 98.2 F (36.8 C)   Resp 20   SpO2 94%   Physical Exam Physical Exam  Nursing note and vitals reviewed. Constitutional:  non-toxic, and in no acute distress Head: Normocephalic and atraumatic.  Mouth/Throat: Oropharynx is clear and moist.  Neck: Normal range of motion. Neck supple.  Cardiovascular: Normal rate and regular rhythm.   Pulmonary/Chest: Effort normal and breath sounds normal.  Abdominal: Soft. There is no tenderness. There is no rebound and no guarding.  Musculoskeletal: Normal range of motion.  Neurological: Alert, no facial droop, fluent speech, moves all extremities symmetrically; Disoriented to location but oriented to self, time and somewhat to situation; Sensation to light touch intact throughout no facial droop;PERRL  Skin: Skin is warm and dry.  Psychiatric: Cooperative  ED Treatments / Results  DIAGNOSTIC STUDIES:  Oxygen Saturation is 94% on 3L [place], adequate by my interpretation.    COORDINATION OF CARE:  11:08 PM Discussed treatment plan with pt and family at bedside   Labs (all labs ordered are listed, but only abnormal results are displayed) Labs Reviewed  CBC WITH DIFFERENTIAL/PLATELET - Abnormal; Notable for the following:       Result Value   MCV 100.2 (*)    MCHC 29.8 (*)    Platelets 103 (*)    All other components within normal limits  BASIC METABOLIC PANEL - Abnormal; Notable for the following:    Chloride 96 (*)    CO2 39 (*)    Glucose, Bld 111 (*)    BUN 21 (*)    Anion gap 3 (*)    All other components within normal limits  APTT - Abnormal; Notable for the following:    aPTT 40 (*)    All other components within normal limits  I-STAT VENOUS BLOOD GAS, ED - Abnormal; Notable for the following:    pCO2, Ven 88.1 (*)    pO2, Ven 62.0 (*)    Bicarbonate 39.3 (*)    Acid-Base Excess 9.0 (*)    All other components within normal limits  PROTIME-INR    EKG  EKG  Interpretation None       Radiology Ct Head  Wo Contrast  Result Date: 12/01/2015 CLINICAL DATA:  [place] tonight heading back of head. Fell 3 weeks ago hitting front of head. EXAM: CT HEAD WITHOUT CONTRAST TECHNIQUE: Contiguous axial images were obtained from the base of the skull through the vertex without intravenous contrast. COMPARISON:  Head CT dated 10/22/2015. FINDINGS: Brain: There is acute subdural hemorrhage overlying the left cerebral hemisphere, measuring 9 mm greatest thickness over the left frontoparietal lobe, with associated mass effect on the underlying hemisphere and an associated rightward midline shift of approximately 1.3 cm. There is also effacement of the left lateral ventricle and probable left uncal herniation. No parenchymal hemorrhage seen. Vascular: No hyperdense vessel or unexpected calcification. Skull: No osseous fracture or dislocation seen. Sinuses/Orbits: No acute findings. Other: Soft tissue edema overlying the right occipital bone. No underlying fracture. Additional soft tissue hematoma overlying the upper left frontal bone, similar to the previous exam. No underlying fracture. IMPRESSION: 1. Acute subdural hemorrhage overlying the left cerebral hemisphere, measuring 9 mm greatest thickness over the left frontoparietal lobe, with associated mass effect on the underlying hemisphere and associated rightward midline shift of approximately 1.3 cm. There is also associated effacement of the left lateral ventricle and probable left uncal herniation. 2. Soft tissue edema overlying the right occipital bone. No underlying fracture. 3. Soft tissue hematoma overlying the upper left frontal bone. No underlying fracture. Critical Value/emergent results were called by telephone at the time of interpretation on 11/19/2015 at 10:58 pm to Dr. Brantley Stage , who verbally acknowledged these results. Electronically Signed   By: Franki Cabot M.D.   On: 11/21/2015 22:59    Procedures Procedures    Medications Ordered in ED Medications - No data to display   Initial Impression / Assessment and Plan / ED Course  I have reviewed the triage vital signs and the nursing notes.  Pertinent labs & imaging results that were available during my care of the patient were reviewed by me and considered in my medical decision making (see chart for details).  CRITICAL CARE Performed by: Forde Dandy, MD Total critical care time: 40 minutes Critical care time was exclusive of separately billable procedures and treating other patients. Critical care was necessary to treat or prevent imminent or life-threatening deterioration. Critical care was time spent personally by me on the following activities: development of treatment plan with patient and/or surrogate as well as nursing, discussions with consultants, evaluation of patient's response to treatment, examination of patient, obtaining history from patient or surrogate, ordering and performing treatments and interventions, ordering and review of laboratory studies, ordering and review of radiographic studies, pulse oximetry and re-evaluation of patient's condition.   Clinical Course   82 year old female who presents with confusion, sleepiness, and difficulty walking after fall 3 days ago. She is in no acute distress, but occasionally disoriented. Overall GCS of 14, but is alert. CT head with SDH 9 mm at widest over left frontotemporal lobe with 1.3 cm rightward shift and ? Of uncal herniation. Discussed with Dr. Saintclair Halsted, recommending transfer to ICU at [facility] to discuss management as she is not the greatest surgical candidate. Patient to be admitted to Dr. Lake Bells in ICU.   Final Clinical Impressions(s) / ED Diagnoses   Final diagnoses:  SDH (subdural hematoma) (HCC)    New Prescriptions New Prescriptions   No medications on file    I personally performed the services described in this documentation, which was scribed in my presence. The  recorded information has been reviewed and is  accurate.     Forde Dandy, MD 11/12/15 [phone]

## 2015-11-11 NOTE — ED Triage Notes (Signed)
Pt c/o fall x 4 days ago , hitting head on frig. Husband reports altered metal status

## 2015-11-11 NOTE — ED Notes (Signed)
Pt fell from sitting on Tuesday,  Hit head on fridge,  Denies loc,  States having confusion today and going to sleep every time she sits down

## 2015-11-12 DIAGNOSIS — S065X9A Traumatic subdural hemorrhage with loss of consciousness of unspecified duration, initial encounter: Secondary | ICD-10-CM | POA: Diagnosis present

## 2015-11-12 DIAGNOSIS — S065XAA Traumatic subdural hemorrhage with loss of consciousness status unknown, initial encounter: Secondary | ICD-10-CM | POA: Diagnosis present

## 2015-11-12 DIAGNOSIS — I6200 Nontraumatic subdural hemorrhage, unspecified: Secondary | ICD-10-CM

## 2015-11-12 LAB — BASIC METABOLIC PANEL
ANION GAP: 17 — AB (ref 5–15)
BUN: 18 mg/dL (ref 6–20)
CHLORIDE: 96 mmol/L — AB (ref 101–111)
CO2: 26 mmol/L (ref 22–32)
Calcium: 9.1 mg/dL (ref 8.9–10.3)
Creatinine, Ser: 0.59 mg/dL (ref 0.44–1.00)
GFR calc Af Amer: >60 mL/min (ref >60)
GFR calc non Af Amer: >60 mL/min (ref >60)
GLUCOSE: 127 mg/dL — AB (ref 65–99)
POTASSIUM: 4.5 mmol/L (ref 3.5–5.1)
Sodium: 139 mmol/L (ref 135–145)

## 2015-11-12 LAB — CBC
HEMATOCRIT: 41.3 % (ref 36.0–46.0)
HEMOGLOBIN: 12.3 g/dL (ref 12.0–15.0)
MCH: 29.8 pg (ref 26.0–34.0)
MCHC: 29.8 g/dL — ABNORMAL LOW (ref 30.0–36.0)
MCV: 100 fL (ref 78.0–100.0)
Platelets: 98 10*3/uL — ABNORMAL LOW (ref 150–400)
RBC: 4.13 MIL/uL (ref 3.87–5.11)
RDW: 13.7 % (ref 11.5–15.5)
WBC: 8.3 10*3/uL (ref 4.0–10.5)

## 2015-11-12 LAB — PHOSPHORUS: Phosphorus: 3.4 mg/dL (ref 2.5–4.6)

## 2015-11-12 LAB — MAGNESIUM: Magnesium: 1.9 mg/dL (ref 1.7–2.4)

## 2015-11-12 LAB — MRSA PCR SCREENING: MRSA BY PCR: NEGATIVE

## 2015-11-12 MED ORDER — FAMOTIDINE IN NACL 20-0.9 MG/50ML-% IV SOLN
20.0000 mg | Freq: Two times a day (BID) | INTRAVENOUS | Status: DC
  Administered 2015-11-12 – 2015-11-15 (×8): 20 mg via INTRAVENOUS
  Filled 2015-11-12 (×8): qty 50

## 2015-11-12 MED ORDER — SODIUM CHLORIDE 0.9 % IV SOLN
250.0000 mL | INTRAVENOUS | Status: DC | PRN
  Administered 2015-11-12 – 2015-11-17 (×2): 250 mL via INTRAVENOUS
  Administered 2015-11-19: 06:00:00 via INTRAVENOUS
  Administered 2015-11-21: 10 mL/h via INTRAVENOUS

## 2015-11-12 MED ORDER — ACETAMINOPHEN 325 MG PO TABS
650.0000 mg | ORAL_TABLET | ORAL | Status: DC | PRN
  Administered 2015-11-12 – 2015-11-16 (×7): 650 mg via ORAL
  Filled 2015-11-12 (×7): qty 2

## 2015-11-12 MED ORDER — DEXAMETHASONE SODIUM PHOSPHATE 4 MG/ML IJ SOLN
4.0000 mg | Freq: Four times a day (QID) | INTRAMUSCULAR | Status: DC
  Administered 2015-11-12 – 2015-11-14 (×11): 4 mg via INTRAVENOUS
  Filled 2015-11-12 (×12): qty 1

## 2015-11-12 MED ORDER — SODIUM CHLORIDE 0.9 % IV SOLN
750.0000 mg | Freq: Two times a day (BID) | INTRAVENOUS | Status: DC
  Administered 2015-11-12 – 2015-11-15 (×7): 750 mg via INTRAVENOUS
  Filled 2015-11-12 (×8): qty 7.5

## 2015-11-12 MED ORDER — HYDRALAZINE HCL 20 MG/ML IJ SOLN
10.0000 mg | INTRAMUSCULAR | Status: DC | PRN
  Administered 2015-11-12 – 2015-11-16 (×6): 20 mg via INTRAVENOUS
  Administered 2015-11-17 (×2): 10 mg via INTRAVENOUS
  Administered 2015-11-18 – 2015-11-20 (×2): 20 mg via INTRAVENOUS
  Filled 2015-11-12 (×9): qty 1

## 2015-11-12 NOTE — Progress Notes (Signed)
Patient ID: Laura [name], female   DOB: 1930/01/24, 82 y.o.   MRN: [id number] Patient resting confluent with C Pap onboard intermittently when she hasn't had C Pap she's been wide awake alert oriented and communicative.  Continue observation the ICU continue steroid will start [place]. Follow-up CT of the morning.

## 2015-11-12 NOTE — Consult Note (Signed)
Reason for Consult: Subdural hematoma Referring Physician: Emergency department  Laura [name] is an 82 y.o. female.  HPI: 82 year old female with pretty significant past medical history of COPD oxygen dependent congestive heart failure coronary artery disease who's had 2 falls 13 weeks ago she came to the ER for with a relatively normal head CT and discharged one more recently this past Tuesday where she fell straight down backwards striking the back and right side of her head. She is chronically lethargic from her hypercarbia however that G has been a little worse over the last few days since Tuesday. The family reports that there is no significant change today from yesterday. Patient was brought into the ER CT scan showed a subdural hematoma and we have been consult.  Past Medical History:  Diagnosis Date  . Acute encephalopathy    resolved  . Anemia   . Arthritis   . Asthma   . CHF (congestive heart failure) ([place])   . Chronic bronchitis   . Complication of anesthesia   . COPD (chronic obstructive pulmonary disease) ([place])   . Coronary artery disease   . Depression   . Dysrhythmia   . Hydronephrosis   . Hypernatremia    resolved  . Hypertension    pulmonary hypotension secondary to chronic heart/lung disease  . Kidney stone   . Myocardial infarction ([place])   . Nephrolithiasis   . Obesity hypoventilation syndrome ([place])   . Peripheral vascular disease ([place])   . PONV (postoperative nausea and vomiting)   . Shortness of breath   . Sleep apnea   . Subclinical hypothyroidism   . Thoracic aortic aneurysm ([place])   . Thrombocytopenia ([place])    resolved  . Transient hypotension    resolved with fluids  . Urinary tract infection     Past Surgical History:  Procedure Laterality Date  . APPENDECTOMY    . CORONARY STENT PLACEMENT    . CYSTOSCOPY W/ URETERAL STENT PLACEMENT Right 04/04/2013   Procedure: CYSTOSCOPY WITH RETROGRADE PYELOGRAM/URETERAL STENT PLACEMENT;  Surgeon: Molli Hazard, MD;  Location: [facility];  Service: Urology;  Laterality: Right;  . CYSTOSCOPY WITH RETROGRADE PYELOGRAM, URETEROSCOPY AND STENT PLACEMENT Right 04/30/2013   Procedure: CYSTOSCOPY WITH RETROGRADE PYELOGRAM, URETEROSCOPY AND STENT EXCHANGE;  Surgeon: Molli Hazard, MD;  Location: [facility];  Service: Urology;  Laterality: Right;  . HOLMIUM LASER APPLICATION Right 09/26/5091   Procedure: HOLMIUM LASER APPLICATION;  Surgeon: Molli Hazard, MD;  Location: [facility];  Service: Urology;  Laterality: Right;  . KNEE SURGERY    . TONSILLECTOMY      Family History  Problem Relation Age of Onset  . Adopted: Yes  . Diabetes Mother   . Heart attack Father     Social History:  reports that she is a non-smoker but has been exposed to tobacco smoke. She has never used smokeless tobacco. She reports that she does not drink alcohol or use drugs.  Allergies:  Allergies  Allergen Reactions  . Codeine Nausea And Vomiting  . Hydrocodone Other (See Comments)    delusions  . Morphine And Related Other (See Comments)    Extremely cofused    Medications: I have reviewed the patient's current medications.  Results for orders placed or performed during the hospital encounter of 11/13/2015 (from the past 48 hour(s))  CBC with Differential     Status: Abnormal   Collection Time: 11/25/2015 11:20 PM  Result Value Ref Range   WBC 6.6 4.0 - 10.5  K/uL   RBC 4.05 3.87 - 5.11 MIL/uL   Hemoglobin 12.1 12.0 - 15.0 g/dL   HCT 40.6 36.0 - 46.0 %   MCV 100.2 (H) 78.0 - 100.0 fL   MCH 29.9 26.0 - 34.0 pg   MCHC 29.8 (L) 30.0 - 36.0 g/dL   RDW 13.4 11.5 - 15.5 %   Platelets 103 (L) 150 - 400 K/uL    Comment: PLATELET COUNT CONFIRMED BY SMEAR SPECIMEN CHECKED FOR CLOTS PLATELETS APPEAR DECREASED    Neutrophils Relative % 75 %   Neutro Abs 5.0 1.7 - 7.7 K/uL   Lymphocytes Relative 13 %   Lymphs Abs 0.8 0.7 - 4.0 K/uL   Monocytes Relative 9 %   Monocytes Absolute 0.6 0.1 - 1.0 K/uL    Eosinophils Relative 3 %   Eosinophils Absolute 0.2 0.0 - 0.7 K/uL   Basophils Relative 0 %   Basophils Absolute 0.0 0.0 - 0.1 K/uL  Basic metabolic panel     Status: Abnormal   Collection Time: 11/29/2015 11:20 PM  Result Value Ref Range   Sodium 138 135 - 145 mmol/L   Potassium 4.5 3.5 - 5.1 mmol/L   Chloride 96 (L) 101 - 111 mmol/L   CO2 39 (H) 22 - 32 mmol/L   Glucose, Bld 111 (H) 65 - 99 mg/dL   BUN 21 (H) 6 - 20 mg/dL   Creatinine, Ser 0.69 0.44 - 1.00 mg/dL   Calcium 8.9 8.9 - 10.3 mg/dL   GFR calc non Af Amer >60 >60 mL/min   GFR calc Af Amer >60 >60 mL/min    Comment: (NOTE) The eGFR has been calculated using the CKD EPI equation. This calculation has not been validated in all clinical situations. eGFR's persistently <60 mL/min signify possible Chronic Kidney Disease.    Anion gap 3 (L) 5 - 15  Protime-INR     Status: None   Collection Time: 12/03/2015 11:20 PM  Result Value Ref Range   Prothrombin Time 13.0 11.4 - 15.2 seconds   INR 0.98   APTT     Status: Abnormal   Collection Time: 12/05/2015 11:20 PM  Result Value Ref Range   aPTT 40 (H) 24 - 36 seconds    Comment:        IF BASELINE aPTT IS ELEVATED, SUGGEST PATIENT RISK ASSESSMENT BE USED TO DETERMINE APPROPRIATE ANTICOAGULANT THERAPY.   I-Stat venous blood gas, ED     Status: Abnormal   Collection Time: 11/24/2015 11:32 PM  Result Value Ref Range   pH, Ven 7.256 7.250 - 7.300   pCO2, Ven 88.1 (HH) 45.0 - 50.0 mmHg   pO2, Ven 62.0 (H) 31.0 - 45.0 mmHg   Bicarbonate 39.3 (H) 20.0 - 24.0 mEq/L   TCO2 42 0 - 100 mmol/L   O2 Saturation 86.0 %   Acid-Base Excess 9.0 (H) 0.0 - 2.0 mmol/L   Patient temperature 98.0 F    Collection site IV START    Drawn by RT    Sample type VENOUS    Comment NOTIFIED PHYSICIAN     Ct Head Wo Contrast  Result Date: 11/26/2015 CLINICAL DATA:  [place] tonight heading back of head. Fell 3 weeks ago hitting front of head. EXAM: CT HEAD WITHOUT CONTRAST TECHNIQUE: Contiguous axial  images were obtained from the base of the skull through the vertex without intravenous contrast. COMPARISON:  Head CT dated 10/22/2015. FINDINGS: Brain: There is acute subdural hemorrhage overlying the left cerebral hemisphere, measuring 9 mm greatest thickness  over the left frontoparietal lobe, with associated mass effect on the underlying hemisphere and an associated rightward midline shift of approximately 1.3 cm. There is also effacement of the left lateral ventricle and probable left uncal herniation. No parenchymal hemorrhage seen. Vascular: No hyperdense vessel or unexpected calcification. Skull: No osseous fracture or dislocation seen. Sinuses/Orbits: No acute findings. Other: Soft tissue edema overlying the right occipital bone. No underlying fracture. Additional soft tissue hematoma overlying the upper left frontal bone, similar to the previous exam. No underlying fracture. IMPRESSION: 1. Acute subdural hemorrhage overlying the left cerebral hemisphere, measuring 9 mm greatest thickness over the left frontoparietal lobe, with associated mass effect on the underlying hemisphere and associated rightward midline shift of approximately 1.3 cm. There is also associated effacement of the left lateral ventricle and probable left uncal herniation. 2. Soft tissue edema overlying the right occipital bone. No underlying fracture. 3. Soft tissue hematoma overlying the upper left frontal bone. No underlying fracture. Critical Value/emergent results were called by telephone at the time of interpretation on 11/15/2015 at 10:58 pm to Dr. Brantley Stage , who verbally acknowledged these results. Electronically Signed   By: Franki Cabot M.D.   On: 11/20/2015 22:59    Review of Systems  Constitutional: Negative.   Respiratory: Positive for shortness of breath.   Neurological: Positive for speech change and headaches.   Blood pressure (!) 163/105, pulse 68, temperature 98.2 F (36.8 C), resp. rate (!) 34, height _0  (1.6  m), weight 92.2 kg (203 lb 4.2 oz), SpO2 99 %. Physical Exam  Constitutional: She is oriented to person, place, and time. She appears well-developed and well-nourished.  HENT:  Head: Normocephalic.  Eyes: Pupils are equal, round, and reactive to light.  Respiratory: Effort normal.  GI: Soft. Bowel sounds are normal.  Neurological: She is alert and oriented to person, place, and time. She has normal strength. GCS eye subscore is 4. GCS verbal subscore is 5. GCS motor subscore is 6.  Patient is awake and alert she is oriented 3 pupils are equal reactive cranial nerves appear to be intact although she might have slight weakness central right seventh. Upper extremity strength is 5 out of 5 with no pronator drift lower extremity strength is also 5 out of 5    Assessment/Plan: 82 year old female with COPD oxygen dependent congestive heart failure with an acute subdural hematoma. The subdural measures about 9 mm she has about 1.2-1.3 cm of midline shift. She is wide awake she is alert she is oriented she does appear to have maybe a slight central seventh but otherwise she is nonfocal she does have some intermittent confusion and perseveration. I had extensive discussions with the family regarding observation versus surgical decompression. In general hematoma the size with associated swelling L would recommend surgical decompression. She is obviously a moderate risk for surgery. Clinically she seems to be doing fairly well and does not seem to be displaying a rapid deterioration. I think therefore its in her best interest to work her up medically make sure that we have cardiopulmonary status as tuned as we can have it and if she can tolerate this with no decline in neurologic status and this clot can liquefy its possible a burr hole procedure could be performed which certainly would be much better tolerated than a full craniotomy which would be what was required at this point. The family definitely is not  interested in rushing and the surgery and taking into account her other associated medical risk  factors and they are on board with taken the next several hours and/or day to observe her and work her up medically. I did explain that its possible this could become urgent or emergent they understand. We will observe her in the ICU she is being evaluated by critical care medicine and we'll place her on steroids to help with a little bit of the cerebral edema keep her nothing by mouth for now and reevaluate.  Garwood Wentzell P 11/12/2015, 2:01 AM

## 2015-11-12 NOTE — Progress Notes (Signed)
[facility]  Chart reviewed, admitted overnight with subdural, midline shift but mentation has been stable  S: resting comfortably on CPAP  O: Vitals:   11/12/15 0333 11/12/15 0400 11/12/15 0500 11/12/15 0800  BP:  (!) 145/81 (!) 158/93   Pulse:  80 80   Resp:  (!) 22 (!) 21   Temp:  98.1 F (36.7 C)  97.4 F (36.3 C)  TempSrc:  Axillary  Axillary  SpO2: 98% 98% 98%   Weight:      Height:       CPAP  Neuro: arouses to voice, speech clear, coherent PULM: CTA B CV: RRR, no mgr GI: BS+, soft, nontender Derm: no rash  Impression: SDH OSA CAD HTN  Plan: Continue close monitoring in ICU q1h neuro checks Continue CPAP while asleep  Husband updated by me  Roselie Awkward, MD [place] [facility] Pager: [phone] Cell: [phone] After 3pm or if no response, call [phone]

## 2015-11-12 NOTE — H&P (Signed)
PULMONARY / CRITICAL CARE MEDICINE   Name: Laura [name] MRN: [id number] DOB: 01-31-1930    ADMISSION DATE:  11/12/2015 CONSULTATION DATE:  11/12/15  REFERRING MD:  [facility]  CHIEF COMPLAINT:  Sleepiness, gait instability  HISTORY OF PRESENT ILLNESS:   Ms. Proud is an 82F w/ PMH significant for CHF, COPD on 2.5L O2, HTN, HLD, CAD, PVD who fell on 8/1 when trying to sit on her rollator. She fell backwards and hit the side of the refrigerator. Over the next 72h she had increasing trouble with lethargy, confusion and gait instability. She had another fall back on 7/15, when she hit her forehead, but without sequelae. Currently she denies pain, nausea, vomiting, diarrhea, urinary problems, shortness of breath, cough, chest pain, palpitations.  Per family, she is a little more lethargic than usual, with perhaps more drool, but she is often somnolent.   PAST MEDICAL HISTORY :  She  has a past medical history of Acute encephalopathy; Anemia; Arthritis; Asthma; CHF (congestive heart failure) ([place]); Chronic bronchitis; Complication of anesthesia; COPD (chronic obstructive pulmonary disease) ([place]); Coronary artery disease; Depression; Dysrhythmia; Hydronephrosis; Hypernatremia; Hypertension; Kidney stone; Myocardial infarction [facility]); Nephrolithiasis; Obesity hypoventilation syndrome ([address] [place] [address]); Peripheral vascular disease ([place]); PONV (postoperative nausea and vomiting); Shortness of breath; Sleep apnea; Subclinical hypothyroidism; Thoracic aortic aneurysm ([place]); Thrombocytopenia ([place]); Transient hypotension; and Urinary tract infection.  PAST SURGICAL HISTORY: She  has a past surgical history that includes Coronary stent placement; Appendectomy; Tonsillectomy; Knee surgery; Cystoscopy w/ ureteral stent placement (Right, 04/04/2013); Cystoscopy with retrograde pyelogram, ureteroscopy and stent placement (Right, 04/30/2013); and Holmium laser application (Right, [id number]).  Allergies  Allergen Reactions  .  Codeine Nausea And Vomiting  . Hydrocodone Other (See Comments)    delusions  . Morphine And Related Other (See Comments)    Extremely cofused    No current facility-administered medications on file prior to encounter.    Current Outpatient Prescriptions on File Prior to Encounter  Medication Sig  . acetaminophen (TYLENOL) 500 MG tablet Take 500 mg by mouth every 6 (six) hours as needed for mild pain.  Marland Kitchen albuterol (PROVENTIL HFA;VENTOLIN HFA) 108 (90 BASE) MCG/ACT inhaler Inhale 2 puffs into the lungs every 4 (four) hours as needed for wheezing (cough).   Marland Kitchen allopurinol (ZYLOPRIM) 100 MG tablet Take 100 mg by mouth daily as needed (gout).  Marland Kitchen amLODipine (NORVASC) 5 MG tablet Take 1 tablet (5 mg total) by mouth daily.  Marland Kitchen aspirin 81 MG tablet Take 81 mg by mouth at bedtime.   Marland Kitchen atorvastatin (LIPITOR) 40 MG tablet Take 40 mg by mouth daily.  . colchicine 0.6 MG tablet Take 0.6 mg by mouth daily as needed (gout.).  Marland Kitchen Cyanocobalamin (VITAMIN B 12 PO) Take 1,000 mcg by mouth daily.  Marland Kitchen denosumab (PROLIA) 60 MG/ML SOLN injection Inject 60 mg into the skin every 6 (six) months. Administer in upper arm, thigh, or abdomen  . dextromethorphan-guaiFENesin (MUCINEX DM) 30-600 MG 12hr tablet Take 1 tablet by mouth daily as needed for cough.  . ezetimibe (ZETIA) 10 MG tablet Take 10 mg by mouth daily.   . ferrous sulfate 325 (65 FE) MG tablet Take 325 mg by mouth daily with breakfast.  . fluticasone (FLOVENT HFA) 110 MCG/ACT inhaler Inhale 2 puffs into the lungs 2 (two) times daily.  . furosemide (LASIX) 40 MG tablet Take 20 mg by mouth. On every third or fourth day.  . metoprolol tartrate (LOPRESSOR) 25 MG tablet Take 25 mg by mouth 2 (two) times daily. For HTN  .  montelukast (SINGULAIR) 10 MG tablet Take 10 mg by mouth every morning. For COPD/Asthma  . nitroGLYCERIN (NITROSTAT) 0.4 MG SL tablet Place 0.4 mg under the tongue every 5 (five) minutes as needed for chest pain.  . NON FORMULARY at bedtime.  CPAP  . ondansetron (ZOFRAN-ODT) 8 MG disintegrating tablet Take 8 mg by mouth every 8 (eight) hours as needed for nausea.   . pantoprazole (PROTONIX) 40 MG tablet Take 40 mg by mouth daily. For GERD  . potassium chloride (K-DUR,KLOR-CON) 10 MEQ tablet Take 10 mEq by mouth daily. On days she takes furosemide only.  . Probiotic Product (PROBIOTIC PO) Take 10 mg by mouth daily.  . sertraline (ZOLOFT) 50 MG tablet Take 50 mg by mouth daily with breakfast. For depression  . tamsulosin (FLOMAX) 0.4 MG CAPS capsule Take 0.4 mg by mouth daily.   . Vitamin D, Ergocalciferol, (DRISDOL) 50000 UNITS CAPS capsule Take 50,000 Units by mouth every 7 (seven) days. For Vitamin D    FAMILY HISTORY:  Her is adopted.   SOCIAL HISTORY: She  reports that she is a non-smoker but has been exposed to tobacco smoke. She has never used smokeless tobacco. She reports that she does not drink alcohol or use drugs.  REVIEW OF SYSTEMS:   General: no weight change, no fever, no chills Cardiovascular: no chest pain, palpitations, orthopnea, or PND Respiratory: see HPI. Chronically short of breath Gastrointestinal: no nausea, vomiting, diarrhea, or constipation Genitourinary: no pain with urination, no foul odor, no frequency, no urgency Musculoskeletal: no joint pain or swelling, no muscle aches Skin: no rashes, sores, or ulcers Endocrine: no blood sugar problems, no thyroid problems Neurologic: no numbness, tingling, dizziness, or visual changes Hematologic: +bruising, no hx of bleeding or clotting problems Psychiatric: no depression or anxiety, no suicidal ideation or intent  SUBJECTIVE:    VITAL SIGNS: BP (!) 163/105   Pulse 68   Temp 98.2 F (36.8 C)   Resp (!) 34   Ht 5\' 3"  (1.6 m)   Wt 92.2 kg (203 lb 4.2 oz)   SpO2 99%   BMI 36.01 kg/m   HEMODYNAMICS:    VENTILATOR SETTINGS:    INTAKE / OUTPUT: No intake/output data recorded.  PHYSICAL EXAMINATION:  General Well nourished, well  developed, no apparent distress  HEENT L forehead hematoma, oropharynx clear. Mallampati IV. Absent dentition. + drooling  Pulmonary Clear to auscultation bilaterally with no wheezes, rales or ronchi. Good effort, symmetrical expansion.   Cardiovascular Normal rate, regular rhythm. S1, s2. No m/r/g. Distal pulses palpable.  Abdomen Soft, non-tender, non-distended, positive bowel sounds, no palpable organomegaly or masses. Normoresonant to percussion.  Musculoskeletal Grossly normal  Lymphatics No cervical, supraclavicular or axillary adenopathy.   Neurologic AAO x 3, PERRL, visual fields intact to confrontation, EOMI, CN II-XII intact, strength 5/5 throughout and equal bilaterally. Reflexes symmetrical.  Skin/Integuement No rash, no cyanosis, no clubbing. 1+ bilateral lower extremity edema.     LABS:  BMET  Recent Labs Lab 11/28/2015 2320  NA 138  K 4.5  CL 96*  CO2 39*  BUN 21*  CREATININE 0.69  GLUCOSE 111*    Electrolytes  Recent Labs Lab 12/02/2015 2320  CALCIUM 8.9    CBC  Recent Labs Lab 11/17/2015 2320  WBC 6.6  HGB 12.1  HCT 40.6  PLT 103*    Coag's  Recent Labs Lab 11/22/2015 2320  APTT 40*  INR 0.98    Sepsis Markers No results for input(s): LATICACIDVEN, PROCALCITON, O2SATVEN in the  last 168 hours.  ABG No results for input(s): PHART, PCO2ART, PO2ART in the last 168 hours.  Liver Enzymes No results for input(s): AST, ALT, ALKPHOS, BILITOT, ALBUMIN in the last 168 hours.  Cardiac Enzymes No results for input(s): TROPONINI, PROBNP in the last 168 hours.  Glucose No results for input(s): GLUCAP in the last 168 hours.  Imaging Ct Head Wo Contrast  Result Date: 11/12/2015 CLINICAL DATA:  [place] tonight heading back of head. Fell 3 weeks ago hitting front of head. EXAM: CT HEAD WITHOUT CONTRAST TECHNIQUE: Contiguous axial images were obtained from the base of the skull through the vertex without intravenous contrast. COMPARISON:  Head CT dated  10/22/2015. FINDINGS: Brain: There is acute subdural hemorrhage overlying the left cerebral hemisphere, measuring 9 mm greatest thickness over the left frontoparietal lobe, with associated mass effect on the underlying hemisphere and an associated rightward midline shift of approximately 1.3 cm. There is also effacement of the left lateral ventricle and probable left uncal herniation. No parenchymal hemorrhage seen. Vascular: No hyperdense vessel or unexpected calcification. Skull: No osseous fracture or dislocation seen. Sinuses/Orbits: No acute findings. Other: Soft tissue edema overlying the right occipital bone. No underlying fracture. Additional soft tissue hematoma overlying the upper left frontal bone, similar to the previous exam. No underlying fracture. IMPRESSION: 1. Acute subdural hemorrhage overlying the left cerebral hemisphere, measuring 9 mm greatest thickness over the left frontoparietal lobe, with associated mass effect on the underlying hemisphere and associated rightward midline shift of approximately 1.3 cm. There is also associated effacement of the left lateral ventricle and probable left uncal herniation. 2. Soft tissue edema overlying the right occipital bone. No underlying fracture. 3. Soft tissue hematoma overlying the upper left frontal bone. No underlying fracture. Critical Value/emergent results were called by telephone at the time of interpretation on 11/12/2015 at 10:58 pm to Dr. Brantley Stage , who verbally acknowledged these results. Electronically Signed   By: Franki Cabot M.D.   On: 12/02/2015 22:59   STUDIES:  CT head as above  CULTURES: none  ANTIBIOTICS: none  SIGNIFICANT EVENTS:   LINES/TUBES: PIV  DISCUSSION: Mrs. Brinson is an 22F admitted for neurologic changes after a mechanical fall. Imaging revealed a L frontotemporal subdural hematoma with 1.3cm rightward shift. PMH is significant for CAD, HTN, HLD, COPD on 2.5L O2, and CHF. Neurosurgery is following and,  while the hematoma is large enough with enough shift to merit operative intervention, her surgical risk is fairly high and she may be able to get by with a less invasive strategy. Expectant management for now.   ASSESSMENT / PLAN:  NEUROLOGIC A:   Acute L frontotemporal subdural hematoma w/ midline shift -  P:   RASS goal:0 Hold ASA Expectant management per NSU  PULMONARY A: Chronic hypoxemic/hypercapneic respiratory failure Hx COPD, severe  Hx Obesity hypoventilation on NIPPV P:   Nocturnal BiPAP as mental status permits Supplemental oxygen to maintain saturations >88% Continue home inhalers  CARDIOVASCULAR A:  Hypertension Hx HFpEF currently compensated P:  Monitor volume status; limit IVF PRN hydralazine and/or labetalol for HTN goal [id number] systolic or A999333 diastolic  RENAL A:   Chronic respiratory acidosis (HCO3 39) P:   Monitor UOP Trend lytes  GASTROINTESTINAL A:   No acute issues P:   NPO  HEMATOLOGIC A:   No acute issues P:  Monitor for additional bleeding  INFECTIOUS A:   No acute issues P:     ENDOCRINE A:   No acute issues P:  FAMILY  - Updates: Husband and daughter at bedside. Patient and family are willing to undergo intubation / mechanical ventilation, but would not want resuscitation in the event of a cardiac arrest. CODE STATUS: LIMITED.   - Inter-disciplinary family meet or Palliative Care meeting due by:  day 7  The patient is critically ill with multiple organ system failure and requires high complexity decision making for assessment and support, frequent evaluation and titration of therapies, advanced monitoring, review of radiographic studies and interpretation of complex data.   Critical Care Time devoted to patient care services, exclusive of separately billable procedures, described in this note is 52 minutes.   Yisroel Ramming, MD Pulmonary [facility] [place] Pager: [phone]  11/12/2015, 2:28 AM

## 2015-11-13 ENCOUNTER — Inpatient Hospital Stay (HOSPITAL_COMMUNITY): Payer: Medicare Other

## 2015-11-13 DIAGNOSIS — I6200 Nontraumatic subdural hemorrhage, unspecified: Secondary | ICD-10-CM

## 2015-11-13 NOTE — Progress Notes (Signed)
[facility]  S: Awake, alert, wants to eat  O: Vitals:   11/13/15 0700 11/13/15 0800 11/13/15 0900 11/13/15 1000  BP: (!) 124/58 136/71 140/71 (!) 156/97  Pulse: 65 (!) 56 (!) 56 81  Resp: 15 12 (!) 0 (!) 27  Temp:  (!) 96.5 F (35.8 C)    TempSrc:  Oral    SpO2: 99% 100% 100% 96%  Weight:      Height:       2L [place]  Neuro: awake, alert, oriented to situation but repeats statements PULM: CTA B, normal effort CV: RRR, no mgr GI: BS+, soft, nontender Derm: no rash  CT head 11/13/2015 :  Slightly smaller left subdural hematoma  Impression: SDH OSA CAD HTN  Plan: OK to transfer to med surg if OK by NSGY Continue neuro checks Advance diet Continue CPAP while asleep  Husband updated by me 11/13/2015   Roselie Awkward, MD [place] [facility] Pager: [phone] Cell: [phone] After 3pm or if no response, call [phone]

## 2015-11-13 NOTE — Progress Notes (Signed)
No acute events Comfortable Awake and alert Moving all extremities well CT stable to slightly better Continue observation in the ICU, may transfer out tomorrow

## 2015-11-14 DIAGNOSIS — J9621 Acute and chronic respiratory failure with hypoxia: Secondary | ICD-10-CM

## 2015-11-14 DIAGNOSIS — J9622 Acute and chronic respiratory failure with hypercapnia: Secondary | ICD-10-CM

## 2015-11-14 LAB — BASIC METABOLIC PANEL
Anion gap: 8 (ref 5–15)
BUN: 33 mg/dL — ABNORMAL HIGH (ref 6–20)
CALCIUM: 9.5 mg/dL (ref 8.9–10.3)
CO2: 34 mmol/L — AB (ref 22–32)
CREATININE: 1.03 mg/dL — AB (ref 0.44–1.00)
Chloride: 98 mmol/L — ABNORMAL LOW (ref 101–111)
GFR calc non Af Amer: 49 mL/min — ABNORMAL LOW (ref >60)
GFR, EST AFRICAN AMERICAN: 57 mL/min — AB (ref >60)
Glucose, Bld: 244 mg/dL — ABNORMAL HIGH (ref 65–99)
Potassium: 4.2 mmol/L (ref 3.5–5.1)
SODIUM: 140 mmol/L (ref 135–145)

## 2015-11-14 LAB — MAGNESIUM: MAGNESIUM: 2.2 mg/dL (ref 1.7–2.4)

## 2015-11-14 LAB — TROPONIN I: Troponin I: 0.09 ng/mL (ref <0.03)

## 2015-11-14 NOTE — Progress Notes (Signed)
Pt had 25 beat run of vtach @ 1728.  RN at bedside.  Pt asymptomatic and unaware of event.  Notified Dr. Oletta Darter via e-link.  Orders being placed at this time.  Will continue monitor pt.

## 2015-11-14 NOTE — Progress Notes (Signed)
Patient ID: Laura [name], female   DOB: 14-Aug-1929, 82 y.o.   MRN: [id number] Patient doing much better no headache no nausea   Awake alert and oriented pupils equal strength 5 out of 5 with no pronator drift  Patient seems to be doing very well okay with me for her to be transferred to the floor and 10 you physical and occupational therapy work on placement

## 2015-11-14 NOTE — Progress Notes (Signed)
[facility] P[facility]  S: Awake, alert, no c/o.  Wore CPAP overnight.  Daughter and husband say she was NOT wearing qhs pos pressure like she should at home.   O: Vitals:   11/14/15 0830 11/14/15 0900 11/14/15 1000 11/14/15 1100  BP:  (!) 147/87 130/74 135/71  Pulse: 77 96 82 73  Resp: (!) 21 (!) 32 (!) 38 19  Temp:      TempSrc:      SpO2: 96% 96% 97% 97%  Weight:      Height:       2L [place]  Neuro: awake, alert, oriented to situation but repeats statements PULM: CTA B, normal effort CV: RRR, no mgr GI: BS+, soft, nontender Derm: no rash  CT head 11/14/2015 :  Slightly smaller left subdural hematoma  Impression: SDH OSA CAD HTN  Plan: OK to transfer to med surg if OK by NSGY  Continue neuro checks Advance diet Continue CPAP qhs -- reiterated need for compliance at home    Nickolas Madrid, NP 11/14/2015  11:49 AM Pager: [phone] or [phone]  Attending Note:  I have examined patient, reviewed labs, studies and notes. I have discussed the case with Shon Millet , and I agree with the data and plans as amended above. Hx of OSA / OHS (supposed to be on trilogy device at home) hospitalized w SDH. Recovering well. She is more alert since using BiPAP during this admission. Will need to work to get her home device optimized so her compliance will improve. Ok out of ICU if OK with NSGY.   Baltazar Apo, MD, PhD 11/14/2015, 12:25 PM [facility] [phone] or if no answer [phone]

## 2015-11-14 NOTE — Progress Notes (Signed)
eLink Physician-Brief Progress Note Patient Name: Laura [name] DOB: 01/13/1930 MRN: [id number]   Date of Service  11/14/2015  HPI/Events of Note  25 beat run of NSVT. Now NSR. Asymptomatic during event.   eICU Interventions  Will order: 1. BMP and Mg++ level now.  2. Cycle troponin. 3. Hold transfer.     Intervention Category Major Interventions: Arrhythmia - evaluation and management  Castulo Scarpelli [place] 11/14/2015, 5:41 PM

## 2015-11-14 NOTE — Progress Notes (Signed)
[place] Progress Note Patient Name: Laura [name] DOB: May 05, 1929 MRN: [id number]   Date of Service  11/14/2015  HPI/Events of Note  Troponin #1 = 0.09. Demand ischemia. Patient admitted with SDH. Reluctant to start ASA or Heparin IV infusion.  eICU Interventions  Continue to trend Troponin.     Intervention Category Intermediate Interventions: Diagnostic test evaluation  Cherylyn Sundby Cornelia Copa 11/14/2015, 7:52 PM

## 2015-11-15 ENCOUNTER — Telehealth: Payer: Self-pay | Admitting: Pulmonary Disease

## 2015-11-15 DIAGNOSIS — I5042 Chronic combined systolic (congestive) and diastolic (congestive) heart failure: Secondary | ICD-10-CM

## 2015-11-15 DIAGNOSIS — R296 Repeated falls: Secondary | ICD-10-CM | POA: Diagnosis present

## 2015-11-15 DIAGNOSIS — W19XXXD Unspecified fall, subsequent encounter: Secondary | ICD-10-CM

## 2015-11-15 DIAGNOSIS — G4733 Obstructive sleep apnea (adult) (pediatric): Secondary | ICD-10-CM

## 2015-11-15 DIAGNOSIS — R0682 Tachypnea, not elsewhere classified: Secondary | ICD-10-CM | POA: Diagnosis present

## 2015-11-15 DIAGNOSIS — I472 Ventricular tachycardia, unspecified: Secondary | ICD-10-CM

## 2015-11-15 DIAGNOSIS — I10 Essential (primary) hypertension: Secondary | ICD-10-CM | POA: Diagnosis present

## 2015-11-15 DIAGNOSIS — R7989 Other specified abnormal findings of blood chemistry: Secondary | ICD-10-CM | POA: Diagnosis present

## 2015-11-15 DIAGNOSIS — D696 Thrombocytopenia, unspecified: Secondary | ICD-10-CM

## 2015-11-15 DIAGNOSIS — W19XXXA Unspecified fall, initial encounter: Secondary | ICD-10-CM | POA: Diagnosis present

## 2015-11-15 DIAGNOSIS — R778 Other specified abnormalities of plasma proteins: Secondary | ICD-10-CM | POA: Diagnosis present

## 2015-11-15 DIAGNOSIS — I2510 Atherosclerotic heart disease of native coronary artery without angina pectoris: Secondary | ICD-10-CM

## 2015-11-15 DIAGNOSIS — R269 Unspecified abnormalities of gait and mobility: Secondary | ICD-10-CM

## 2015-11-15 DIAGNOSIS — J449 Chronic obstructive pulmonary disease, unspecified: Secondary | ICD-10-CM

## 2015-11-15 LAB — BASIC METABOLIC PANEL
Anion gap: 6 (ref 5–15)
BUN: 36 mg/dL — AB (ref 6–20)
CALCIUM: 9.2 mg/dL (ref 8.9–10.3)
CHLORIDE: 100 mmol/L — AB (ref 101–111)
CO2: 36 mmol/L — AB (ref 22–32)
CREATININE: 0.76 mg/dL (ref 0.44–1.00)
GFR calc non Af Amer: >60 mL/min (ref >60)
Glucose, Bld: 101 mg/dL — ABNORMAL HIGH (ref 65–99)
Potassium: 4.4 mmol/L (ref 3.5–5.1)
Sodium: 142 mmol/L (ref 135–145)

## 2015-11-15 LAB — TROPONIN I
Troponin I: 0.09 ng/mL (ref <0.03)
Troponin I: 0.09 ng/mL (ref <0.03)

## 2015-11-15 MED ORDER — LEVETIRACETAM 750 MG PO TABS
750.0000 mg | ORAL_TABLET | Freq: Two times a day (BID) | ORAL | Status: DC
  Administered 2015-11-15 – 2015-11-16 (×3): 750 mg via ORAL
  Filled 2015-11-15 (×4): qty 1

## 2015-11-15 MED ORDER — LEVETIRACETAM 750 MG PO TABS: 750.0000 mg | ORAL_TABLET | Freq: Two times a day (BID) | ORAL | Status: DC

## 2015-11-15 MED ORDER — FAMOTIDINE 20 MG PO TABS
20.0000 mg | ORAL_TABLET | Freq: Two times a day (BID) | ORAL | Status: DC
  Administered 2015-11-15 – 2015-11-16 (×3): 20 mg via ORAL
  Filled 2015-11-15 (×4): qty 1

## 2015-11-15 NOTE — Evaluation (Signed)
Physical Therapy Evaluation Patient Details Name: Laura [name] MRN: [id number] DOB: 18-Apr-1929 Today's Date: 11/15/2015   History of Present Illness  23 female admitted for neurologic changes after a mechanical fall on 8/1. Imaging revealed a L frontotemporal subdural hematoma with 1.3cm rightward shift. PMH is significant for CAD, HTN, HLD, COPD on 2.5L O2, and CHF. OF NOTE: patient also with recent fall on 7/15  Clinical Impression  Patient demonstrates deficits in functional mobility as indicated below. Will need continued skilled PT to address deficits and maximize function. Will see as indicated and progress as tolerated. OF NOTE patient with history of multiple falls, highly recommend CIR upon acute discharge. Will see to facilitate disposition and safe mobility.    Follow Up Recommendations CIR;Supervision/Assistance - 24 hour    Equipment Recommendations  Other (comment) (tbd)    Recommendations for Other Services       Precautions / Restrictions Precautions Precautions: Fall Restrictions Weight Bearing Restrictions: No      Mobility  Bed Mobility Overal bed mobility: Needs Assistance Bed Mobility: Supine to Sit     Supine to sit: Min assist     General bed mobility comments: patient able to move LEs to EOB, cues for positioning, Min assist to rotate trunk and elevation to upright position. VCs for repositioning at EOB  Transfers Overall transfer level: Needs assistance Equipment used: 2 person hand held assist (with use of gait belt) Transfers: Sit to/from Stand Sit to Stand: Mod assist;+2 physical assistance         General transfer comment: VCs for hand placement and positioning, moderate assist to power up and maintain stability, noted LE weakness R>L during transfer  Ambulation/Gait Ambulation/Gait assistance: Mod assist;+2 physical assistance Ambulation Distance (Feet): 4 Feet Assistive device: 2 person hand held assist (with gait belt) Gait  Pattern/deviations: Step-to pattern;Decreased stride length;Shuffle;Decreased dorsiflexion - right;Trunk flexed Gait velocity: decreased Gait velocity interpretation: <1.8 ft/sec, indicative of risk for recurrent falls General Gait Details: patient very unsteady moderate assist to maintain stability, cues for step and weight shift. Patient very fearful with mobility. moderate assist for stability, noted RLE weakness with limited step length and some buckling during minimal steps to chair.   Stairs            Wheelchair Mobility    Modified Rankin (Stroke Patients Only) Modified Rankin (Stroke Patients Only) Pre-Morbid Rankin Score: No symptoms Modified Rankin: Moderately severe disability     Balance Overall balance assessment: Needs assistance;History of Falls Sitting-balance support: Feet supported;Single extremity supported (using LUE for support) Sitting balance-Leahy Scale: Fair Sitting balance - Comments: able to self support in sitting but unable to take on challange without noted balance checks   Standing balance support: Bilateral upper extremity supported;During functional activity (HHA) Standing balance-Leahy Scale: Poor Standing balance comment: patient very unsteady moderate assist for static standing                             Pertinent Vitals/Pain Pain Assessment: No/denies pain    Home Living Family/patient expects to be discharged to:: Private residence Living Arrangements: Spouse/significant other Available Help at Discharge: Family;Available 24 hours/day Type of Home:  (townhome) Home Access: Stairs to enter Entrance Stairs-Rails: Can reach both Entrance Stairs-Number of Steps: 2 Home Layout: Two level;Able to live on main level with bedroom/bathroom Home Equipment: Gilford Rile - 2 wheels;Cane - single point;Bedside commode      Prior Function Level of Independence: Needs assistance  Gait / Transfers Assistance Needed: modified independent  with use of rollator walker           Hand Dominance   Dominant Hand: Right    Extremity/Trunk Assessment   Upper Extremity Assessment: Defer to OT evaluation           Lower Extremity Assessment: Generalized weakness;RLE deficits/detail;LLE deficits/detail RLE Deficits / Details: some noted instability and weakness with activity and functional movement    Cervical / Trunk Assessment:  (increased body habitus)  Communication   Communication: No difficulties  Cognition Arousal/Alertness: Awake/alert Behavior During Therapy: WFL for tasks assessed/performed Overall Cognitive Status: Impaired/Different from baseline Area of Impairment: Attention;Safety/judgement   Current Attention Level: Sustained     Safety/Judgement: Decreased awareness of safety     General Comments: Patient distracted by environment at times    General Comments General comments (skin integrity, edema, etc.): hygiene and pericare performed during session    Exercises        Assessment/Plan    PT Assessment Patient needs continued PT services  PT Diagnosis Difficulty walking;Abnormality of gait;Generalized weakness   PT Problem List Decreased strength;Decreased range of motion;Decreased balance;Decreased mobility;Decreased coordination;Decreased safety awareness;Cardiopulmonary status limiting activity  PT Treatment Interventions DME instruction;Gait training;Stair training;Functional mobility training;Therapeutic activities;Therapeutic exercise;Balance training;Patient/family education   PT Goals (Current goals can be found in the Care Plan section) Acute Rehab PT Goals Patient Stated Goal: to get better PT Goal Formulation: With patient/family Time For Goal Achievement: 11/29/15 Potential to Achieve Goals: Good    Frequency Min 3X/week   Barriers to discharge        Co-evaluation               End of Session Equipment Utilized During Treatment: Gait belt;Oxygen Activity  Tolerance: Patient limited by fatigue Patient left: in chair;with call bell/phone within reach;with family/visitor present Nurse Communication: Mobility status         Time: [id number] PT Time Calculation (min) (ACUTE ONLY): 27 min   Charges:   PT Evaluation $PT Eval Moderate Complexity: 1 Procedure PT Treatments $Therapeutic Activity: 8-22 mins   PT G CodesDuncan Dull 2015/11/19, 10:40 AM Alben Deeds, PT DPT  [phone]
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
## 2015-11-15 NOTE — Progress Notes (Addendum)
Inpatient Diabetes Program Recommendations  AACE/ADA: New Consensus Statement on Inpatient Glycemic Control (2015)  Target Ranges:  Prepandial:   less than 140 mg/dL      Peak postprandial:   less than 180 mg/dL (1-2 hours)      Critically ill patients:  140 - 180 mg/dL   Lab Results  Component Value Date   GLUCAP 96 08/04/2013    Review of Glycemic Control  Results for TEIONA, SOLIVAN (MRN [id number]) as of 11/15/2015 09:49  Ref. Range 11/12/2015 01:24 11/12/2015 03:51 11/14/2015 18:29 11/14/2015 23:40 11/15/2015 05:44  Glucose Latest Ref Range: 65 - 99 mg/dL  127 (H) 244 (H)      Diabetes history: none noted Outpatient Diabetes medications: none Current orders for Inpatient glycemic control: none  Inpatient Diabetes Program Recommendations:  elevated lab glucose (244mg /dl) on 11/14/15.     Per ADA recommendations "consider performing an A1C on all patients with diabetes or hypreglycemia admitted to the hospital if not performed in the prior 3 months".  Gentry Fitz, RN, BA, MHA, CDE Diabetes Coordinator Inpatient Diabetes Program  [phone] (Team Pager) [phone] ([place]) 11/15/2015 9:51 AM

## 2015-11-15 NOTE — Telephone Encounter (Signed)
Spoke with patient daughter, Leonia Reader, states that the patient is in the hospital currently, [phone] at [facility]. Pt is being followed currently by Pulmonary and pt daughter is aware of this. Leonia Reader is asking that her most recent test results be reviewed - they have yet to receive these results.  -Instructions  Return in about 4 weeks (around 10/26/2015).   Continue using your Trilogy.  Call me if you have any new breathing problems at your next appointment.  We will review your test results at your follow-up appointment.  TESTS ORDERED: 1. Full pulmonary function testing on or before next appointment 2. Alpha-1 antitrypsin phenotype today 3. Complete Echocardiogram 4. CXR PA/LAT TODAY   It does not look like a CXR was ordered or done same day.   Please advise Dr Ashok Cordia. Thanks.

## 2015-11-15 NOTE — Telephone Encounter (Signed)
Pt's daughter is requesting results on PFT and alpha-1 lab that was done in June 2017.  Pt's daughter states they never received the results of this test.   JN please advise.  Thanks!

## 2015-11-15 NOTE — Care Management Note (Signed)
Case Management Note  Patient Details  Name: Laura [name] MRN: [id number] Date of Birth: May 20, 1929  Subjective/Objective: Pt admitted on 11/21/2015 with SDH after fall on 8/1.  PTA, pt resided at home with spouse; has two supportive daughters.                       Action/Plan: PT recommending CIR prior to dc home; OT consult pending.  CIR consult in process.  Will follow for discharge planning as pt progresses.    Expected Discharge Date:                  Expected Discharge Plan:  [place]  In-House Referral:     Discharge planning Services  CM Consult  Post Acute Care Choice:    Choice offered to:     DME Arranged:    DME Agency:     HH Arranged:    [place] Agency:     Status of Service:  In process, will continue to follow  If discussed at Long Length of Stay Meetings, dates discussed:    Additional Comments:  Reinaldo Raddle, RN, BSN  Trauma/Neuro ICU Case Manager [phone]

## 2015-11-15 NOTE — Telephone Encounter (Signed)
Thanks for the update

## 2015-11-15 NOTE — Progress Notes (Signed)
[facility] PCCM  S: Awake, alert, no c/o.  Wore CPAP overnight.  On O2. Wants to go to rehab. 25 beats svt reported 8/7 evening  O: Vitals:   11/15/15 0620 11/15/15 0700 11/15/15 0755 11/15/15 0800  BP: (!) 151/100 (!) 141/73  137/77  Pulse: 61 (!) 53  (!) 58  Resp: (!) 21 16  16   Temp:    97.4 F (36.3 C)  TempSrc:    Axillary  SpO2: 100% 98% 98% 96%  Weight:      Height:       2L [place]  Neuro: awake, alert, oriented , pleasant and speech clear PULM: CTA B, normal effort CV: RRR, no mgr GI: BS+, soft, nontender Derm: no rash  CT head 8/6 :  Slightly smaller left subdural hematoma  Impression: SDH stable OSA on cpap COPD O2 dependent CAD 2 d 6/17 ef 60% HTN SVT 8/7  Plan: OK to transfer to  tele and its OK by NSGY , monitor on tele for svt runs, electrolytes checked + trop I, NSC, check 12 lead for completeness. Continue neuro checks Advance diet Continue CPAP qhs -- reiterated need for compliance at home  ? Need or inpt rehab [facility] called to assume care 8/9 8/8 lytes ordered   [facility] Minor ACNP Maryanna Shape PCCM Pager [phone] till 3 pm If no answer page [phone] 11/15/2015, 10:13 AM   STAFF NOTE: I, Merrie Roof, MD FACP have personally reviewed patient's available data, including medical history, events of note, physical examination and test results as part of my evaluation. I have discussed with resident/NP and other care providers such as pharmacist, RN and RRT. In addition, I personally evaluated patient and elicited key findings of:  Awake in chair, neuro exam strong, O x 3, SVT noted, get ecg, no further trop needed, re assess echo, last echo 2016 reviewed, noctrunal cpap as she toleartes, would consult rehab, move out but keep on tele To [facility]  Lavon Paganini. Titus Mould, MD, [place] Pgr: [facility] 11/15/2015 11:40 AM

## 2015-11-15 NOTE — Consult Note (Signed)
   [facility] CM Inpatient Consult   11/15/2015  PAIZLY MCOWEN 1930-03-27 [id number]  Patient screened for potential [place] Management services. Patient is eligible for [facility] Care Management services under patient's Medicare  plan.  According to the chart review, this patient is currently pursuing inpatient rehab post hospital. This is the patient's first admission with a SDH.   Please place a [facility] Care Management consult, if this need chnages or for questions contact:   Natividad Brood, RN BSN [facility] Liaison  [phone] business mobile phone Toll free office [phone]

## 2015-11-15 NOTE — Telephone Encounter (Signed)
Spoke with pt's daughter, aware of recs.  Nothing further needed.   

## 2015-11-15 NOTE — Progress Notes (Signed)
Rehab Admissions Coordinator Note:  Patient was screened by Retta Diones for appropriateness for an Inpatient Acute Rehab Consult.  At this time, we are recommending Inpatient Rehab consult.  Jodell Cipro M 11/15/2015, 10:49 AM  I can be reached at [phone].

## 2015-11-15 NOTE — Telephone Encounter (Signed)
Her alpha-1 antitrypsin was normal. Her last PFTs were in June and showed Severe COPD. The plan was to have another set of full PFTs on or before next appointment but I do not see that was done.

## 2015-11-15 NOTE — Progress Notes (Signed)
Patient ID: Laura [name], female   DOB: [birth year], 82 y.o.   MRN: [id number] Patient resting comfortably with C Pap  Continue with physical occupational therapy okay to transfer to the floor work on placement

## 2015-11-16 ENCOUNTER — Inpatient Hospital Stay (HOSPITAL_COMMUNITY): Payer: Medicare Other

## 2015-11-16 ENCOUNTER — Telehealth: Payer: Self-pay | Admitting: Pulmonary Disease

## 2015-11-16 DIAGNOSIS — R0600 Dyspnea, unspecified: Secondary | ICD-10-CM

## 2015-11-16 DIAGNOSIS — I471 Supraventricular tachycardia, unspecified: Secondary | ICD-10-CM | POA: Diagnosis present

## 2015-11-16 LAB — CBC
HEMATOCRIT: 43.7 % (ref 36.0–46.0)
HEMOGLOBIN: 13.1 g/dL (ref 12.0–15.0)
MCH: 29.6 pg (ref 26.0–34.0)
MCHC: 30 g/dL (ref 30.0–36.0)
MCV: 98.9 fL (ref 78.0–100.0)
Platelets: 125 10*3/uL — ABNORMAL LOW (ref 150–400)
RBC: 4.42 MIL/uL (ref 3.87–5.11)
RDW: 13.9 % (ref 11.5–15.5)
WBC: 7.1 10*3/uL (ref 4.0–10.5)

## 2015-11-16 LAB — ECHOCARDIOGRAM COMPLETE
Height: 63 in
WEIGHTICAEL: 3361.57 [oz_av]

## 2015-11-16 LAB — BASIC METABOLIC PANEL
ANION GAP: 8 (ref 5–15)
BUN: 32 mg/dL — ABNORMAL HIGH (ref 6–20)
CHLORIDE: 99 mmol/L — AB (ref 101–111)
CO2: 34 mmol/L — AB (ref 22–32)
CREATININE: 0.76 mg/dL (ref 0.44–1.00)
Calcium: 8.8 mg/dL — ABNORMAL LOW (ref 8.9–10.3)
GFR calc non Af Amer: >60 mL/min (ref >60)
Glucose, Bld: 86 mg/dL (ref 65–99)
Potassium: 4 mmol/L (ref 3.5–5.1)
Sodium: 141 mmol/L (ref 135–145)

## 2015-11-16 LAB — MAGNESIUM: Magnesium: 1.9 mg/dL (ref 1.7–2.4)

## 2015-11-16 LAB — BRAIN NATRIURETIC PEPTIDE: B NATRIURETIC PEPTIDE 5: 834.3 pg/mL — AB (ref 0.0–100.0)

## 2015-11-16 LAB — PHOSPHORUS: Phosphorus: 2.7 mg/dL (ref 2.5–4.6)

## 2015-11-16 MED ORDER — ATORVASTATIN CALCIUM 40 MG PO TABS
40.0000 mg | ORAL_TABLET | Freq: Every day | ORAL | Status: DC
  Administered 2015-11-16: 40 mg via ORAL
  Filled 2015-11-16 (×3): qty 1

## 2015-11-16 MED ORDER — FUROSEMIDE 20 MG PO TABS
20.0000 mg | ORAL_TABLET | ORAL | Status: DC
  Filled 2015-11-16: qty 1

## 2015-11-16 MED ORDER — METOPROLOL TARTRATE 12.5 MG HALF TABLET
12.5000 mg | ORAL_TABLET | Freq: Two times a day (BID) | ORAL | Status: DC
  Administered 2015-11-16 (×2): 12.5 mg via ORAL
  Filled 2015-11-16 (×3): qty 1

## 2015-11-16 MED ORDER — MONTELUKAST SODIUM 10 MG PO TABS
10.0000 mg | ORAL_TABLET | ORAL | Status: DC
  Administered 2015-11-16: 10 mg via ORAL
  Filled 2015-11-16: qty 1

## 2015-11-16 MED ORDER — ONDANSETRON 8 MG PO TBDP
8.0000 mg | ORAL_TABLET | Freq: Three times a day (TID) | ORAL | Status: DC | PRN
  Administered 2015-11-16: 8 mg via ORAL
  Filled 2015-11-16: qty 1
  Filled 2015-11-16: qty 2

## 2015-11-16 MED ORDER — VITAMIN D (ERGOCALCIFEROL) 1.25 MG (50000 UNIT) PO CAPS
50000.0000 [IU] | ORAL_CAPSULE | ORAL | Status: DC
  Administered 2015-11-16: 50000 [IU] via ORAL
  Filled 2015-11-16: qty 1

## 2015-11-16 MED ORDER — FUROSEMIDE 10 MG/ML IJ SOLN
20.0000 mg | Freq: Once | INTRAMUSCULAR | Status: AC
  Administered 2015-11-16: 20 mg via INTRAVENOUS
  Filled 2015-11-16: qty 4

## 2015-11-16 MED ORDER — SERTRALINE HCL 50 MG PO TABS
50.0000 mg | ORAL_TABLET | Freq: Every day | ORAL | Status: DC
  Administered 2015-11-16 – 2015-11-21 (×3): 50 mg via ORAL
  Filled 2015-11-16 (×6): qty 1

## 2015-11-16 MED ORDER — FUROSEMIDE 10 MG/ML IJ SOLN
40.0000 mg | Freq: Once | INTRAMUSCULAR | Status: AC
  Administered 2015-11-17: 40 mg via INTRAVENOUS
  Filled 2015-11-16: qty 4

## 2015-11-16 MED ORDER — EZETIMIBE 10 MG PO TABS
10.0000 mg | ORAL_TABLET | Freq: Every day | ORAL | Status: DC
  Administered 2015-11-16: 10 mg via ORAL
  Filled 2015-11-16 (×3): qty 1

## 2015-11-16 MED ORDER — ALBUTEROL SULFATE (2.5 MG/3ML) 0.083% IN NEBU: 3.0000 mL | INHALATION_SOLUTION | RESPIRATORY_TRACT | Status: DC | PRN

## 2015-11-16 NOTE — Care Management Important Message (Signed)
Important Message  Patient Details  Name: Laura [name] MRN: [id number] Date of Birth: 1929/09/23   Medicare Important Message Given:  Yes    [name], Leroy Sea 11/16/2015, 3:10 PM

## 2015-11-16 NOTE — Telephone Encounter (Signed)
Spoke with Dr. Grandville Silos.  He reports the daughter Marja Kays is telling him they never receive test results from when they saw JN. I see documented that pt daughter was made aware of results on 11/15/15.  I called spoke with daughter Carlyne. She reports it must have been her sister we spoke with bc she was not given any results.  I gave her pt most recent test results. She also wants to know what is causing pt CO2 to be so high. She reports she has yet to be able to talk with the pul doctor about pt and what is going on with her.  Daughter requesting call from [place] to discuss pt. Please advise thanks

## 2015-11-16 NOTE — Progress Notes (Signed)
PROGRESS NOTE    Laura [name]  [id number] DOB: July 07, 1929 DOA: 11/13/2015 PCP: Jerlyn Ly, MD    Brief Narrative:  Laura [name] is an 8F w/ PMH significant for CHF, COPD on 2.5L O2, HTN, HLD, CAD, PVD who fell on 8/1 when trying to sit on her rollator. She fell backwards and hit the side of the refrigerator. Over the next 72h she had increasing trouble with lethargy, confusion and gait instability. She had another fall back on 7/15, when she hit her forehead, but without sequelae. Currently she denies pain, nausea, vomiting, diarrhea, urinary problems, shortness of breath, cough, chest pain, palpitations.  Per family, she is a little more lethargic than usual, with perhaps more drool, but she is often somnolent.  Patient was admitted to the ICU under critical care and followed by neurosurgery during the hospitalization. Repeat head CT done showed some improvement.   Assessment & Plan:   Active Problems:   SDH (subdural hematoma) (HCC)   Subdural hematoma (HCC)   VT (ventricular tachycardia) (HCC)   Benign essential HTN   Gait disorder   Falls   Tachypnea   Elevated troponin   Thrombocytopenia (HCC)   #1 subdural hematoma Secondary to mechanical fall while trying to sit on rollator, and hitting her head on the refrigerator. Repeat CT head 11/13/2015 was slightly smaller left subdural hematoma. Patient denies any headaches. Patient denies any focal neurological deficits. Neurosurgery following and I recommended repeat head CT tomorrow. PT/OT. Continue Keppra for seizure prophylaxis. Inpatient rehabilitation assessing patient for admission. Per neurosurgery.  #2 shortness of breath Patient with some complaints of shortness of breath. Patient noted on admission to have elevated troponins which was felt to be secondary to demand ischemia. Patient is +2.275 L during this hospitalization. Chest x-ray done with increased bilateral opacities and some pleural effusions. 2-D echo which  was done with a EF of 40-45% with hypokinesis of the inferior lateral myocardium. Will place patient on Lasix 20 mg IV 1. Follow urinary output. Daily weights. Unable to place on aspirin secondary to problem #1. Patient's beta blocker has been resumed at half home dose. Follow.  #3 elevated troponin/abnormal 2-D echo On admission patient noted to have a elevated troponin which seemed to have plateaued and likely secondary to demand ischemia. 2-D echo which was done however with a depressed ejection fraction of 40-45% with hypokinesis of the inferior lateral myocardium. Patient has been resumed on half home dose beta blocker. Unable to place on aspirin secondary to subdural hematoma. Continue Lipitor and Zetia. Resume home oral dose of Lasix 20 mg every other day tomorrow morning. Patient be given a dose of IV Lasix 20 mg IV 1 today. Will consult with cardiology tomorrow for further evaluation and management. Family requesting patient's cardiologist Dr. Wynonia Lawman be consulted to assess patient.  #4 SVT Patient was noted to have a run of SVT a few nights ago 11/14/15. No further runs of SVT. Magnesium level at 1.9. Potassium at 4. Resume half home dose beta blocker due to soft blood pressure. Follow.  #5 COPD O2 dependent Stable. Per family patient was placed on high levels of O2 and family concerned about this overnight. Patient currently 100% on 2 L nasal cannula. Decrease O2 to 1 L and keep sats between 89-93%.  #6 obstructive sleep apnea CPAP daily at bedtime.  #7 hypertension Stable. Blood pressure somewhat soft. Patient has been started on half home dose beta blocker. Follow.   DVT prophylaxis: SCDs Code Status: Partial Family  Communication: Updated patient, husband, daughter at bedside. Disposition Plan: CIR versus skilled nursing facility versus home with home health.   Consultants:   PCCM admission  Procedures:   Chest x-ray 11/16/2015  CT head 11/13/2015, 12/01/2015  2-D echo  11/16/2015  Antimicrobials:   None   Subjective: Patient with some complaints of shortness of breath. No chest pain. Family at bedside concern about outpatient labs obtained in June 2007 which have not been gone over with them and family seems upset at that. Patient denies any focal neurological deficits.  Objective: Vitals:   11/16/15 1230 11/16/15 1442 11/16/15 1617 11/16/15 1634  BP:  (!) 175/78 132/65   Pulse:  65 63   Resp:  18  (!) 24  Temp:  97.8 F (36.6 C)    TempSrc:  Oral    SpO2: 95% 96% 94%   Weight:      Height:       No intake or output data in the 24 hours ending 11/16/15 1657 Filed Weights   11/14/15 0500 11/15/15 0300 11/16/15 0510  Weight: 97.4 kg (214 lb 11.7 oz) 93.8 kg (206 lb 12.7 oz) 95.3 kg (210 lb 1.6 oz)    Examination:  General exam: Appears calm and comfortable  Respiratory system: Clear to auscultation. Decreased breath sounds in the bases. Some use of accessory muscles of respiration.  Cardiovascular system: S1 & S2 heard, RRR. No JVD, murmurs, rubs, gallops or clicks. No pedal edema. Gastrointestinal system: Abdomen is nondistended, soft and nontender. No organomegaly or masses felt. Normal bowel sounds heard. Central nervous system: Alert and oriented. No focal neurological deficits. Extremities: Symmetric 5 x 5 power. Skin: No rashes, lesions or ulcers Psychiatry: Judgement and insight appear normal. Mood & affect appropriate.     Data Reviewed: I have personally reviewed following labs and imaging studies  CBC:  Recent Labs Lab 11/29/2015 2320 11/12/15 0351 11/16/15 0652  WBC 6.6 8.3 7.1  NEUTROABS 5.0  --   --   HGB 12.1 12.3 13.1  HCT 40.6 41.3 43.7  MCV 100.2* 100.0 98.9  PLT 103* 98* 0000000*   Basic Metabolic Panel:  Recent Labs Lab 12/06/2015 2320 11/12/15 0351 11/14/15 1829 11/15/15 0544 11/16/15 0652 11/16/15 0804  NA 138 139 140 142 141  --   K 4.5 4.5 4.2 4.4 4.0  --   CL 96* 96* 98* 100* 99*  --   CO2 39* 26  34* 36* 34*  --   GLUCOSE 111* 127* 244* 101* 86  --   BUN 21* 18 33* 36* 32*  --   CREATININE 0.69 0.59 1.03* 0.76 0.76  --   CALCIUM 8.9 9.1 9.5 9.2 8.8*  --   MG  --  1.9 2.2  --   --  1.9  PHOS  --  3.4  --   --   --  2.7   GFR: Estimated Creatinine Clearance: 58.5 mL/min (by C-G formula based on SCr of 0.8 mg/dL). Liver Function Tests: No results for input(s): AST, ALT, ALKPHOS, BILITOT, PROT, ALBUMIN in the last 168 hours. No results for input(s): LIPASE, AMYLASE in the last 168 hours. No results for input(s): AMMONIA in the last 168 hours. Coagulation Profile:  Recent Labs Lab 11/26/2015 2320  INR 0.98   Cardiac Enzymes:  Recent Labs Lab 11/14/15 1829 11/14/15 2340 11/15/15 0544  TROPONINI 0.09* 0.09* 0.09*   BNP (last 3 results) No results for input(s): PROBNP in the last 8760 hours. HbA1C: No results for input(s):  HGBA1C in the last 72 hours. CBG: No results for input(s): GLUCAP in the last 168 hours. Lipid Profile: No results for input(s): CHOL, HDL, LDLCALC, TRIG, CHOLHDL, LDLDIRECT in the last 72 hours. Thyroid Function Tests: No results for input(s): TSH, T4TOTAL, FREET4, T3FREE, THYROIDAB in the last 72 hours. Anemia Panel: No results for input(s): VITAMINB12, FOLATE, FERRITIN, TIBC, IRON, RETICCTPCT in the last 72 hours. Sepsis Labs: No results for input(s): PROCALCITON, LATICACIDVEN in the last 168 hours.  Recent Results (from the past 240 hour(s))  MRSA PCR Screening     Status: None   Collection Time: 11/12/15  1:24 AM  Result Value Ref Range Status   MRSA by PCR NEGATIVE NEGATIVE Final    Comment:        The GeneXpert MRSA Assay (FDA approved for NASAL specimens only), is one component of a comprehensive MRSA colonization surveillance program. It is not intended to diagnose MRSA infection nor to guide or monitor treatment for MRSA infections.          Radiology Studies: No results found.      Scheduled Meds: . atorvastatin   40 mg Oral Daily  . ezetimibe  10 mg Oral Daily  . famotidine  20 mg Oral BID  . levETIRAcetam  750 mg Oral BID  . metoprolol tartrate  12.5 mg Oral BID  . montelukast  10 mg Oral BH-q7a  . sertraline  50 mg Oral Q breakfast  . Vitamin D (Ergocalciferol)  50,000 Units Oral Q Wed   Continuous Infusions:    LOS: 5 days    Time spent: 88 minutes    THOMPSON,DANIEL, MD [facility] Pager [phone]  If 7PM-7AM, please contact night-coverage [email or web address] Password [facility] 11/16/2015, 4:57 PM

## 2015-11-16 NOTE — Progress Notes (Signed)
  Echocardiogram 2D Echocardiogram has been performed.  Laura [name] 11/16/2015, 3:39 PM

## 2015-11-16 NOTE — Care Management Note (Signed)
Case Management Note  Patient Details  Name: Laura [name] MRN: [id number] Date of Birth: Nov 28, 1929  Subjective/Objective:                    Action/Plan: Pt with some SOB requiring CPAP this am. Plan is for CIR tomorrow. CM following for discharge needs.   Expected Discharge Date:                  Expected Discharge Plan:  [place]  In-House Referral:     Discharge planning Services  CM Consult  Post Acute Care Choice:    Choice offered to:     DME Arranged:    DME Agency:     HH Arranged:    [place] Agency:     Status of Service:  In process, will continue to follow  If discussed at Long Length of Stay Meetings, dates discussed:    Additional Comments:  Pollie Friar, RN 11/16/2015, 3:09 PM

## 2015-11-16 NOTE — Progress Notes (Signed)
Patient ID: Laura [name], female   DOB: 1929/09/21, 82 y.o.   MRN: [id number] Patient overall is doing okay headache well-controlled then feel quite as good today because she had a rough night with her sleeping doesn't feel like to see Pap is the same face mask that she had last night. I will order a CT follow up in the morning patient can be transferred to nursing when cleared from her pulmonary status.

## 2015-11-16 NOTE — Progress Notes (Signed)
RN placed pt on bipap per pt's request due to increased wob and sob. RT bled 2L O2 into bipap. Pt stable throughout with no complications. RT will continue to monitor.

## 2015-11-16 NOTE — Telephone Encounter (Signed)
Spoke at length with the patient's daughter regarding her multiple medical concerns. I reviewed the patient's chest x-ray imaging as well as transthoracic echocardiogram. I explained that she likely has underlying diastolic congestive heart failure in addition to her systolic congestive heart failure contributing to dyspnea with any tachycardia. Currently she is on Lopressor and I attempted to reassure her that this will help to control her heart rate/rhythm. We also discussed her chronic hypercarbic respiratory failure and the need for minimizing hyperoxygenation  to prevent VQ mismatching. Currently they have weaned the patient to 1 L/m and she is saturating 94% per documentation by bedside nurse. The patient is concerned that she may not be able to transition to a rehabilitation facility yet. I tried to reassure the patient that the staff will closely monitor her and ensure that she is continuing to clinically improve before transitioning to a rehabilitation facility. She thanked me and acknowledged understanding.

## 2015-11-16 NOTE — Progress Notes (Signed)
Inpatient Rehabilitation  I met with patient and family to discuss team's recommendation for IP Rehab.  Shared booklets and answered questions.  Patient and family eager for IP Rehab admission so that patient can regain as much independence as possible.  I plan to follow for therapy tolerance prior to admission.  Please call with questions.    Carmelia Roller., CCC/SLP Admission Coordinator  [place]  Cell [phone]

## 2015-11-16 NOTE — Progress Notes (Signed)
Occupational Therapy Evaluation Patient Details Name: Laura [name] MRN: [id number] DOB: 1930-02-29 Today's Date: 11/16/2015    History of Present Illness 39 female admitted for neurologic changes after a mechanical fall on 8/1. Imaging revealed a L frontotemporal subdural hematoma with 1.3cm rightward shift. PMH is significant for CAD, HTN, HLD, COPD on 2.5L O2, and CHF. OF NOTE: patient also with recent fall on 7/15   Clinical Impression   PTA, pt on home O2 and was mod I with mobility and ADL. Was driving @ 1 week PTA. Pt presents with deficits listed below.Pt will benefit from rehab at CIR to facilitate return to PLOF. Will follow acutely to address established goals. Pt complaining of increased SOB. O2 Sats 94. HR 70. Nsg made aware.     Follow Up Recommendations  CIR;Supervision/Assistance - 24 hour    Equipment Recommendations  3 in 1 bedside comode    Recommendations for Other Services Rehab consult     Precautions / Restrictions Precautions Precautions: Fall      Mobility Bed Mobility Overal bed mobility: Needs Assistance Bed Mobility: Supine to Sit     Supine to sit: Min assist;HOB elevated        Transfers Overall transfer level: Needs assistance   Transfers: Sit to/from Stand;Stand Pivot Transfers Sit to Stand: Min assist;From elevated surface (with stedy)              Balance Overall balance assessment: History of Falls           Standing balance-Leahy Scale: Poor Standing balance comment: L bias                            ADL Overall ADL's : Needs assistance/impaired Eating/Feeding: Set up   Grooming: Set up;Sitting   Upper Body Bathing: Minimal assitance;Sitting   Lower Body Bathing: Moderate assistance;Sit to/from stand   Upper Body Dressing : Minimal assistance;Sitting   Lower Body Dressing: Maximal assistance;Sit to/from Health and safety inspector Details (indicate cue type and reason): used Denna Haggard            General ADL Comments: Increased SOB this pm. distracted during ADL tasks. Difficulty following multistep commands      Vision Additional Comments: will further assess   Perception     Praxis      Pertinent Vitals/Pain Pain Assessment: No/denies pain     Hand Dominance     Extremity/Trunk Assessment Upper Extremity Assessment Upper Extremity Assessment: Generalized weakness   Lower Extremity Assessment Lower Extremity Assessment: Generalized weakness   Cervical / Trunk Assessment Cervical / Trunk Assessment: Normal   Communication Communication Communication: No difficulties   Cognition Arousal/Alertness: Awake/alert Behavior During Therapy: WFL for tasks assessed/performed Overall Cognitive Status: Impaired/Different from baseline Area of Impairment: Attention;Memory;Following commands;Safety/judgement;Awareness;Problem solving   Current Attention Level: Sustained Memory: Decreased short-term memory Following Commands: Follows one step commands consistently Safety/Judgement: Decreased awareness of safety;Decreased awareness of deficits Awareness: Emergent Problem Solving: Slow processing;Difficulty sequencing     General Comments       Exercises Exercises: Other exercises Other Exercises Other Exercises: sit -sand in stedy   Shoulder Instructions      Home Living Family/patient expects to be discharged to:: Inpatient rehab  Prior Functioning/Environment Level of Independence: Needs assistance  Gait / Transfers Assistance Needed: modified independent with use of rollator walker          OT Diagnosis: Generalized weakness;Cognitive deficits   OT Problem List: Decreased strength;Decreased activity tolerance;Impaired balance (sitting and/or standing);Decreased cognition;Decreased safety awareness;Decreased knowledge of use of DME or AE;Cardiopulmonary status limiting activity;Obesity   OT  Treatment/Interventions: Self-care/ADL training;Therapeutic exercise;Energy conservation;DME and/or AE instruction;Therapeutic activities;Cognitive remediation/compensation;Patient/family education;Balance training    OT Goals(Current goals can be found in the care plan section) Acute Rehab OT Goals Patient Stated Goal: to get better OT Goal Formulation: With patient/family Time For Goal Achievement: 11/30/15 Potential to Achieve Goals: Good ADL Goals Pt Will Perform Upper Body Bathing: with set-up;sitting Pt Will Transfer to Toilet: with min guard assist;ambulating;bedside commode Pt Will Perform Toileting - Clothing Manipulation and hygiene: with supervision;sitting/lateral leans Additional ADL Goal #1: Demonstrate anticipatory awareness during ADL tasks in nondistracting enviornment  OT Frequency: Min 2X/week   Barriers to D/C:            Co-evaluation              End of Session Equipment Utilized During Treatment: Oxygen Nurse Communication: Mobility status;Need for lift equipment (stedy)  Activity Tolerance: Patient tolerated treatment well Patient left: in chair;with call bell/phone within reach;with chair alarm set;with family/visitor present   Time: [id number] OT Time Calculation (min): 35 min Charges:  OT General Charges $OT Visit: 1 Procedure OT Evaluation $OT Eval Moderate Complexity: 1 Procedure OT Treatments $Self Care/Home Management : 8-22 mins G-Codes:    Dorlene Footman,HILLARY 12-05-15, 4:53 PM  [facility], OTR/L  [phone] 2015/12/05

## 2015-11-17 ENCOUNTER — Encounter (HOSPITAL_COMMUNITY): Payer: Self-pay | Admitting: Cardiology

## 2015-11-17 ENCOUNTER — Inpatient Hospital Stay (HOSPITAL_COMMUNITY): Payer: Medicare Other

## 2015-11-17 DIAGNOSIS — J9602 Acute respiratory failure with hypercapnia: Secondary | ICD-10-CM

## 2015-11-17 DIAGNOSIS — J9600 Acute respiratory failure, unspecified whether with hypoxia or hypercapnia: Secondary | ICD-10-CM

## 2015-11-17 LAB — POCT I-STAT 3, ART BLOOD GAS (G3+)
Acid-Base Excess: 16 mmol/L — ABNORMAL HIGH (ref 0.0–2.0)
BICARBONATE: 42.5 meq/L — AB (ref 20.0–24.0)
O2 Saturation: 99 %
PO2 ART: 134 mmHg — AB (ref 80.0–100.0)
TCO2: 44 mmol/L (ref 0–100)
pCO2 arterial: 55.8 mmHg — ABNORMAL HIGH (ref 35.0–45.0)
pH, Arterial: 7.486 — ABNORMAL HIGH (ref 7.350–7.450)

## 2015-11-17 LAB — CBC
HEMATOCRIT: 46 % (ref 36.0–46.0)
Hemoglobin: 13.5 g/dL (ref 12.0–15.0)
MCH: 29.6 pg (ref 26.0–34.0)
MCHC: 29.3 g/dL — ABNORMAL LOW (ref 30.0–36.0)
MCV: 100.9 fL — AB (ref 78.0–100.0)
Platelets: 120 10*3/uL — ABNORMAL LOW (ref 150–400)
RBC: 4.56 MIL/uL (ref 3.87–5.11)
RDW: 13.9 % (ref 11.5–15.5)
WBC: 7.9 10*3/uL (ref 4.0–10.5)

## 2015-11-17 LAB — GLUCOSE, CAPILLARY
GLUCOSE-CAPILLARY: 155 mg/dL — AB (ref 65–99)
GLUCOSE-CAPILLARY: 171 mg/dL — AB (ref 65–99)
Glucose-Capillary: 111 mg/dL — ABNORMAL HIGH (ref 65–99)
Glucose-Capillary: 118 mg/dL — ABNORMAL HIGH (ref 65–99)
Glucose-Capillary: 179 mg/dL — ABNORMAL HIGH (ref 65–99)

## 2015-11-17 LAB — BASIC METABOLIC PANEL
Anion gap: 7 (ref 5–15)
BUN: 26 mg/dL — ABNORMAL HIGH (ref 6–20)
CALCIUM: 8.8 mg/dL — AB (ref 8.9–10.3)
CHLORIDE: 94 mmol/L — AB (ref 101–111)
CO2: 39 mmol/L — AB (ref 22–32)
CREATININE: 0.86 mg/dL (ref 0.44–1.00)
GFR calc non Af Amer: >60 mL/min (ref >60)
Glucose, Bld: 134 mg/dL — ABNORMAL HIGH (ref 65–99)
Potassium: 3.8 mmol/L (ref 3.5–5.1)
Sodium: 140 mmol/L (ref 135–145)

## 2015-11-17 LAB — BLOOD GAS, ARTERIAL
Acid-Base Excess: 15.1 mmol/L — ABNORMAL HIGH (ref 0.0–2.0)
Bicarbonate: 42.7 mEq/L — ABNORMAL HIGH (ref 20.0–24.0)
Delivery systems: POSITIVE
Drawn by: 46203
Expiratory PAP: 9.5
FIO2: 0.36
Inspiratory PAP: 16
O2 Content: 4 L/min
O2 Saturation: 83.2 %
Patient temperature: 98.6
TCO2: 45.8 mmol/L (ref 0–100)
pCO2 arterial: 101 mmHg (ref 35.0–45.0)
pH, Arterial: 7.249 — ABNORMAL LOW (ref 7.350–7.450)
pO2, Arterial: 55.3 mmHg — ABNORMAL LOW (ref 80.0–100.0)

## 2015-11-17 LAB — BRAIN NATRIURETIC PEPTIDE: B Natriuretic Peptide: 796.5 pg/mL — ABNORMAL HIGH (ref 0.0–100.0)

## 2015-11-17 LAB — MAGNESIUM: Magnesium: 2 mg/dL (ref 1.7–2.4)

## 2015-11-17 MED ORDER — FENTANYL CITRATE (PF) 100 MCG/2ML IJ SOLN
50.0000 ug | INTRAMUSCULAR | Status: DC | PRN
  Administered 2015-11-17 – 2015-11-18 (×9): 100 ug via INTRAVENOUS
  Filled 2015-11-17 (×9): qty 2

## 2015-11-17 MED ORDER — SODIUM CHLORIDE 0.9 % IV BOLUS (SEPSIS)
500.0000 mL | Freq: Once | INTRAVENOUS | Status: AC
  Administered 2015-11-17: 500 mL via INTRAVENOUS

## 2015-11-17 MED ORDER — IPRATROPIUM-ALBUTEROL 0.5-2.5 (3) MG/3ML IN SOLN
3.0000 mL | Freq: Four times a day (QID) | RESPIRATORY_TRACT | Status: DC
  Administered 2015-11-18 – 2015-11-22 (×17): 3 mL via RESPIRATORY_TRACT
  Filled 2015-11-17 (×19): qty 3

## 2015-11-17 MED ORDER — DEXTROSE 5 % IV SOLN
30.0000 ug/min | INTRAVENOUS | Status: DC
  Filled 2015-11-17: qty 1

## 2015-11-17 MED ORDER — FENTANYL CITRATE (PF) 100 MCG/2ML IJ SOLN
INTRAMUSCULAR | Status: AC
  Administered 2015-11-17: 100 ug
  Filled 2015-11-17: qty 2

## 2015-11-17 MED ORDER — CHLORHEXIDINE GLUCONATE 0.12% ORAL RINSE (MEDLINE KIT): 15.0000 mL | Freq: Two times a day (BID) | OROMUCOSAL | Status: DC

## 2015-11-17 MED ORDER — POTASSIUM CHLORIDE 10 MEQ/100ML IV SOLN
10.0000 meq | INTRAVENOUS | Status: AC
Start: 2015-11-17 — End: 2015-11-17
  Administered 2015-11-17 (×4): 10 meq via INTRAVENOUS
  Filled 2015-11-17 (×4): qty 100

## 2015-11-17 MED ORDER — ROCURONIUM BROMIDE 50 MG/5ML IV SOLN
1.0000 mg/kg | Freq: Once | INTRAVENOUS | Status: DC
  Filled 2015-11-17: qty 9.03

## 2015-11-17 MED ORDER — MIDAZOLAM HCL 2 MG/2ML IJ SOLN
INTRAMUSCULAR | Status: AC
  Administered 2015-11-17: 2 mg
  Filled 2015-11-17: qty 2

## 2015-11-17 MED ORDER — FENTANYL CITRATE (PF) 100 MCG/2ML IJ SOLN
50.0000 ug | INTRAMUSCULAR | Status: DC | PRN
  Administered 2015-11-17: 50 ug via INTRAVENOUS
  Filled 2015-11-17: qty 2

## 2015-11-17 MED ORDER — BUDESONIDE 0.25 MG/2ML IN SUSP
0.2500 mg | Freq: Two times a day (BID) | RESPIRATORY_TRACT | Status: DC
Start: 2015-11-17 — End: 2015-11-22
  Administered 2015-11-17 – 2015-11-22 (×10): 0.25 mg via RESPIRATORY_TRACT
  Filled 2015-11-17 (×10): qty 2

## 2015-11-17 MED ORDER — ACETAMINOPHEN 650 MG RE SUPP
650.0000 mg | Freq: Four times a day (QID) | RECTAL | Status: DC | PRN
  Administered 2015-11-17: 650 mg via RECTAL
  Filled 2015-11-17: qty 1

## 2015-11-17 MED ORDER — METHYLPREDNISOLONE SODIUM SUCC 125 MG IJ SOLR: 60.0000 mg | Freq: Two times a day (BID) | INTRAMUSCULAR | Status: DC

## 2015-11-17 MED ORDER — ANTISEPTIC ORAL RINSE SOLUTION (CORINZ): 7.0000 mL | Freq: Four times a day (QID) | OROMUCOSAL | Status: DC

## 2015-11-17 MED ORDER — FAMOTIDINE IN NACL 20-0.9 MG/50ML-% IV SOLN
20.0000 mg | Freq: Two times a day (BID) | INTRAVENOUS | Status: DC
  Administered 2015-11-17 – 2015-11-18 (×3): 20 mg via INTRAVENOUS
  Filled 2015-11-17 (×4): qty 50

## 2015-11-17 MED ORDER — FENTANYL CITRATE (PF) 100 MCG/2ML IJ SOLN: 50.0000 ug | INTRAMUSCULAR | Status: DC | PRN

## 2015-11-17 MED ORDER — DEXAMETHASONE SODIUM PHOSPHATE 4 MG/ML IJ SOLN
4.0000 mg | Freq: Four times a day (QID) | INTRAMUSCULAR | Status: DC
  Administered 2015-11-17 – 2015-11-18 (×5): 4 mg via INTRAVENOUS
  Filled 2015-11-17 (×6): qty 1

## 2015-11-17 MED ORDER — IPRATROPIUM-ALBUTEROL 0.5-2.5 (3) MG/3ML IN SOLN
3.0000 mL | Freq: Four times a day (QID) | RESPIRATORY_TRACT | Status: DC
  Administered 2015-11-17 – 2015-11-18 (×4): 3 mL via RESPIRATORY_TRACT
  Filled 2015-11-17 (×3): qty 3

## 2015-11-17 MED ORDER — SODIUM CHLORIDE 0.9 % IV SOLN
750.0000 mg | Freq: Two times a day (BID) | INTRAVENOUS | Status: DC
  Administered 2015-11-17 (×2): 750 mg via INTRAVENOUS
  Filled 2015-11-17 (×4): qty 7.5

## 2015-11-17 MED ORDER — ETOMIDATE 2 MG/ML IV SOLN
0.3000 mg/kg | Freq: Once | INTRAVENOUS | Status: AC
  Administered 2015-11-17: 27.1 mg via INTRAVENOUS
  Filled 2015-11-17: qty 13.55

## 2015-11-17 MED ORDER — ROCURONIUM BROMIDE 50 MG/5ML IV SOLN
1.0000 mg/kg | Freq: Once | INTRAVENOUS | Status: AC
  Administered 2015-11-17: 10 mg via INTRAVENOUS
  Filled 2015-11-17: qty 9.03

## 2015-11-17 MED ORDER — FUROSEMIDE 10 MG/ML IJ SOLN
60.0000 mg | Freq: Three times a day (TID) | INTRAMUSCULAR | Status: DC
  Administered 2015-11-17 – 2015-11-18 (×3): 60 mg via INTRAVENOUS
  Filled 2015-11-17 (×4): qty 6

## 2015-11-17 MED ORDER — ANTISEPTIC ORAL RINSE SOLUTION (CORINZ)
7.0000 mL | Freq: Four times a day (QID) | OROMUCOSAL | Status: DC
  Administered 2015-11-17 – 2015-11-19 (×9): 7 mL via OROMUCOSAL

## 2015-11-17 MED ORDER — CHLORHEXIDINE GLUCONATE 0.12% ORAL RINSE (MEDLINE KIT)
15.0000 mL | Freq: Two times a day (BID) | OROMUCOSAL | Status: DC
  Administered 2015-11-17 – 2015-11-19 (×6): 15 mL via OROMUCOSAL

## 2015-11-17 MED ORDER — METOPROLOL TARTRATE 5 MG/5ML IV SOLN: 2.5000 mg | Freq: Four times a day (QID) | INTRAVENOUS | Status: DC | PRN

## 2015-11-17 NOTE — Progress Notes (Signed)
Patient transferred from 23M. Patient currently intubated. CSW following for disposition and SNF back up to CIR once medically appropriate.          Emiliano Dyer, LCSW [facility] ED/44M Clinical Social Worker [phone]

## 2015-11-17 NOTE — Progress Notes (Signed)
Nursing note: Patient moving all four extremities and following commands.  Cough, gag, and corneal intact.  Called Dr. Saintclair Halsted to advise.  Left message for return call.

## 2015-11-17 NOTE — Progress Notes (Addendum)
PULMONARY / CRITICAL CARE MEDICINE   Name: Laura [name] MRN: [id number] DOB: 02-02-1930    ADMISSION DATE:  12/08/2015 LOS 6 days  REFERRING MD:  [facility]  CHIEF COMPLAINT:  SDH  BRIEF 82 yo admitted 8/4 with SDH from fall. NS followed without surgical intervention needed. Transferred to floor and to [facility] service 8/8. During the night of 8/10 she became somnolent and CT scan revealed increased left SDH and she required intubation for airway protection. Family to decide how aggressive her care is to be. NS to evaluate.   STUDIES:  8/10 CT head with expanded L sdh  CULTURES:   ANTIBIOTICS:   SIGNIFICANT EVENTS: 8/10 intubated   LINES/TUBES: 8/10 ETT>>   SUBJECTIVE/OVERNIGHT/INTERVAL HX 11/17/15 - RN unable to get OG/NG. D/w Dr Saintclair Halsted - poor prognosis for surgery and not c/w patient goals per his family conversation. Currently on prn sedation and following comments. Mild restlessnes + . Dr Saintclair Halsted to meet wit family again   VITAL SIGNS: BP 132/63   Pulse 88   Temp 97 F (36.1 C) (Oral)   Resp 18   Ht 5\' 3"  (1.6 m)   Wt 90.7 kg (199 lb 15.3 oz)   SpO2 (!) 88%   BMI 35.42 kg/m   HEMODYNAMICS:    VENTILATOR SETTINGS: Vent Mode: PRVC FiO2 (%):  [50 %-60 %] 60 % Set Rate:  [18 bmp] 18 bmp Vt Set:  [420 mL] 420 mL PEEP:  [5 cmH20] 5 cmH20 Plateau Pressure:  [21 cmH20] 21 cmH20  INTAKE / OUTPUT: I/O last 3 completed shifts: In: 52 [P.O.:50] Out: 550 [Urine:550]  PHYSICAL EXAMINATION: General:  Obese WF obtunded Neuro:  On prn sedation - mild restlenssnes, follows commands.Moves all 4s HEENT:  ETT-> vent Cardiovascular: HSR RRR Lungs:  Coarse  And sync with vent Abdomen:  *OBESE + BS Musculoskeletal:  Itact Skin:  Cool and dry, vascular changes lower ext LABS:   PULMONARY  Recent Labs Lab 12/08/2015 2332 11/17/15 0558 11/17/15 0900  PHART  --  7.249* 7.486*  PCO2ART  --  101* 55.8*  PO2ART  --  55.3* 134.0*  HCO3 39.3* 42.7* 42.5*  TCO2 42 45.8 44   O2SAT 86.0 83.2 99.0    CBC  Recent Labs Lab 11/12/15 0351 11/16/15 0652 11/17/15 0300  HGB 12.3 13.1 13.5  HCT 41.3 43.7 46.0  WBC 8.3 7.1 7.9  PLT 98* 125* 120*    COAGULATION  Recent Labs Lab 11/14/2015 2320  INR 0.98    CARDIAC   Recent Labs Lab 11/14/15 1829 11/14/15 2340 11/15/15 0544  TROPONINI 0.09* 0.09* 0.09*   No results for input(s): PROBNP in the last 168 hours.   CHEMISTRY  Recent Labs Lab 11/12/15 0351 11/14/15 1829 11/15/15 0544 11/16/15 0652 11/16/15 0804 11/17/15 0300  NA 139 140 142 141  --  140  K 4.5 4.2 4.4 4.0  --  3.8  CL 96* 98* 100* 99*  --  94*  CO2 26 34* 36* 34*  --  39*  GLUCOSE 127* 244* 101* 86  --  134*  BUN 18 33* 36* 32*  --  26*  CREATININE 0.59 1.03* 0.76 0.76  --  0.86  CALCIUM 9.1 9.5 9.2 8.8*  --  8.8*  MG 1.9 2.2  --   --  1.9 2.0  PHOS 3.4  --   --   --  2.7  --    Estimated Creatinine Clearance: 53 mL/min (by C-G formula based on SCr of 0.86  mg/dL).   LIVER  Recent Labs Lab 11/28/2015 2320  INR 0.98     INFECTIOUS No results for input(s): LATICACIDVEN, PROCALCITON in the last 168 hours.   ENDOCRINE CBG (last 3)   Recent Labs  11/17/15 0730  GLUCAP 111*         IMAGING x48h  - image(s) personally visualized  -   highlighted in bold Ct Head Wo Contrast  Result Date: 11/17/2015 CLINICAL DATA:  Follow-up examination for subdural hematoma. EXAM: CT HEAD WITHOUT CONTRAST TECHNIQUE: Contiguous axial images were obtained from the base of the skull through the vertex without intravenous contrast. COMPARISON:  Prior CT from 11/13/2015. FINDINGS: Previously identified holo hemispheric mixed density left subdural hematoma again seen. This is relatively slightly increased in size measuring up to 12 mm in maximal thickness. Slightly decreased hyperdense blood products within this collection. Trace parafalcine hemorrhage has largely resolved. Trace tentorial hemorrhage again noted. Persistent mass  effect on the left cerebral hemisphere with partial effacement of the left lateral ventricle. Left-to-right shift measures 12 mm. No frank ventricular entrapment. Similar dilatation of the right lateral ventricle. Effacement of the basilar cisterns is slightly worsened. No acute abnormality about the globes and orbits. Paranasal sinuses remain clear.  No mastoid effusion. Calvarium intact. Evolving left frontal scalp contusion noted.  No calvarial fracture. No acute parenchymal hemorrhage.  No acute infarct. IMPRESSION: Slightly increased size of mixed density left subdural hematoma without evidence of interval re-bleeding. Slightly worsened left-to-right shift now measuring up to 12 mm. Basilar cistern crowding has also slightly progressed as compared to previous. Electronically Signed   By: Jeannine Boga M.D.   On: 11/17/2015 03:11   Dg Chest Port 1 View  Result Date: 11/17/2015 CLINICAL DATA:  Respiratory failure EXAM: PORTABLE CHEST 1 VIEW COMPARISON:  Yesterday FINDINGS: Severe cardiomegaly. Endotracheal tube placed. Tip is 1.7 cm from the carina. Thorax is rotated to the right. Bibasilar opacity is unchanged. Upper lung zones are grossly clear. IMPRESSION: Endotracheal tube placed.  Otherwise stable. Electronically Signed   By: Marybelle Killings M.D.   On: 11/17/2015 08:04   Dg Chest Port 1 View  Result Date: 11/16/2015 CLINICAL DATA:  Shortness of breath since noon today. History of MI, CHF, COPD. EXAM: PORTABLE CHEST 1 VIEW COMPARISON:  08/10/2013 FINDINGS: Heart is enlarged. There are bilateral pleural effusions. Increased bibasilar opacities now obscure the hemidiaphragms bilaterally, left greater than right. There is mild pulmonary vascular congestion but no significant pulmonary edema. IMPRESSION: 1. Stable cardiomegaly. 2. Increased bilateral opacities consistent with infectious infiltrates or atelectasis and pleural effusions. Electronically Signed   By: Nolon Nations M.D.   On: 11/16/2015  16:59     DISCUSSION: 82 yo admitted 8/4 with SDH from fall. NS followed without surgical intervention needed. Transferred to floor and to [facility] service 8/8. During the night of 8/10 she became somnolent and CT scan revealed increased left SDH and she required intubation for airway protection. Family to decide how aggressive her care is to be. NS to evaluate.  ASSESSMENT / PLAN:  PULMONARY A: VDRF secondary to expanding SDH with inability to protect airway OSA COPD  - following commandson full vent support  P:   Dc montelukalst Vent bundle BD's Solumedrol  Will need bipap if extubated - Dr Saintclair Halsted to complete discussions wit family and then decide on medical v terminal extubation dependiong on course   CARDIOVASCULAR A:  CAD Recent SVT - transient hypertension but now soft but adequate bp  P:  Tele  Lopressors prn IV (dc po) Dc lasix (tkae it at home every other day(); restart when bp better dC neo but restart If there is a probelm  RENAL Lab Results  Component Value Date   CREATININE 0.86 11/17/2015   CREATININE 0.76 11/16/2015   CREATININE 0.76 11/15/2015   CREATININE 0.78 05/05/2013    A:  No acute issue  P:   Follow creatine  GASTROINTESTINAL A:   GI protection Unable to gt NG/OG 11/17/2015  P:   H2 blocker NPO  HEMATOLOGIC A:   Fall with SDH P:  Follow coags Monitor  INFECTIOUS A:   No overt infectious process P:     ENDOCRINE   A:   No acute issue P:   Follow glucose  NEUROLOGIC A:   SDH secondary to fall 8/10 somnolent with CT scan revealing increased SDH and required transfer to ICU and intubation. NS to evaluate.  - following commands and mildly resteless. CT shift worsenign felt to be mild P:   RASS goal: 0 Minimal sedation insetting of expanding SDH - do prn sedation Zoloft home dose to continue now for baseline depression Keppra (new) since 11/15/15 to continue   FAMILY  - Updates: No family at bedside. In route for  conference for clarification of code status.  - Inter-disciplinary family meet or Palliative Care meeting due by:  8/17 - dr Saintclair Halsted has initiated this process 11/17/2015   subsequeently had IDT  Interdisciplinary Goals of Care Family Meeting    Date carried out:: 11/17/2015  Location of the meeting: Bedside  Member's involved: Physician, Bedside Registered Nurse, Family Member or next of kin and Other: daughter x 2, grandaugghter, son in Sports coach and husband  Durable Power of Forensic psychologist or Loss adjuster, chartered:  Husband  Discussion: We discussed goals of care for [employer] .  - family most concerned about current decompensation. Says few years ago had hypercarbic resp failure and lived well for 2 years since then barring cpap qhs non-compliance. REcently 08/23/15 had fev1 0.6L/40%, Ration 74 and apparently hasd ex desats and was advised 1L at rest o2 and 4L with exetrtion. They feel patient had too much o2 yesterday resulting in hypercarbia and was intubated. tHey are upset about this . They want bare minimum o2 only . ECHO shows 0000000 new onset systolic chf since June 0000000      Code status: Limited Code or DNR with short term  Disposition: Continue current acute care bu DNR if arrests. Goal is to concurrent comfort but also aim for medical extubation and try to get her in position for rehab to extent possible. Advised if we can get her extubated then bipap qhs + limit o2 only for pulse ox goal > 88%  If no recovery next few to several days then discuss total palliation. Will call cards consult. Meanwhile aggressive diuresis   Time spent for the meeting: 30 additiona minutes  Loni Delbridge 11/17/2015 1:50 PM       The patient is critically ill with multiple organ systems failure and requires high complexity decision making for assessment and support, frequent evaluation and titration of therapies, application of advanced monitoring technologies and extensive  interpretation of multiple databases.   Critical Care Time devoted to patient care services described in this note is  30  Minutes. This time reflects time of care of this signee Dr Brand Males. This critical care time does not reflect procedure time, or teaching time or supervisory time of PA/NP/Med student/Med Resident etc  but could involve care discussion time    Dr. Brand Males, M.D., [facility].C.P Pulmonary and Critical Care Medicine Staff Physician [facility] Pager: [phone], If no answer or between  15:00h - 7:00h: call [phone]  11/17/2015 10:01 AM

## 2015-11-17 NOTE — Progress Notes (Signed)
Rapid response called after Blood gas result.Neuro surgery MD called  and order that  pt be transferred to ICU to be intubated as he has already spoken with pt Family. Pt transferred to Medical  ICU Rm 09  for intubation. Report given to receiving  RN Jearld Adjutant. VS prior to transfere was BP 113/60 HR 58 oxygen saturation 100% on BIPAP with  50 %  Fio2 . Patient Family notified about the transfer Rn spoke with the spouse Mr Gregorek.

## 2015-11-17 NOTE — Progress Notes (Signed)
Patient ID: Laura [name], female   DOB: 23-Nov-1929, 82 y.o.   MRN: [id number] Patient improved remains intubated but now she is awake and alert and following commands we had extensive discussions with the family and we agreed to no surgical intervention. Critical care will continue to wean the event with hopes to extubation this afternoon or tomorrow and we have talked about after the patient is extubated that we put a DO NOT RESUSCITATE and DO NOT INTUBATE order on the chart. Continue to support her as redo but should this happen again DO NOT INTUBATE.Marland Kitchen

## 2015-11-17 NOTE — Progress Notes (Signed)
[facility] PCCM Attending:  I have seen and examined the patient with nurse practitioner/resident and agree with the note above.  We formulated the plan together and I elicited the following history.    Ms. Laura [name] is known to me from earlier in her hospitalization when she was admitted with a subdural and managed conservatively in the ICU.  She has COPD and chronic hypercarbic respiratory failure on home BIPAP.  Overnight from 8/9 to 8/10 AM she had worsening mental status and a CT head showed enlarging subdural and midline shift.  On exam Obtunded Lungs with wheezing bilaterally Belly soft, nontender irreg irreg, no mgr  CT head > worsening midline shift  Acute encephalopathy due to worsening subdural hematoma> intubated now in ICU, plan is to discuss situation with neurosurgery to see if they feel she would benefit from surgical intervention.  Dr. Christella Noa is aware of her situation.    Acute on chronic respiratory failure with hypercarbia> continue full vent support   COPD> now appears to be in exacerbation, plan decadron scheduled and duoneb q4h  Rest as per [name] Minor NP note  I updated the family (husband Al) by phone 11/17/2015@7 :39 AM  My cc time 39 minutes  Roselie Awkward, MD [place] PCCM Pager: [phone] Cell: [phone] After 3pm or if no response, call [phone]

## 2015-11-17 NOTE — Progress Notes (Signed)
PT Cancellation Note  Patient Details Name: JAELIE SOULIERE MRN: [id number] DOB: 02/21/1930   Cancelled Treatment:    Reason Eval/Treat Not Completed: Medical issues which prohibited therapy (change in status will follow)   Duncan Dull 11/17/2015, 7:03 AM Alben Deeds, PT DPT  [phone]

## 2015-11-17 NOTE — Procedures (Signed)
Intubation Procedure Note Laura [name] [id number] [birth year]  Procedure: Intubation Indications: Respiratory insufficiency  Procedure Details Consent: Unable to obtain consent because of emergent medical necessity. Time Out: Verified patient identification, verified procedure, site/side was marked, verified correct patient position, special equipment/implants available, medications/allergies/relevent history reviewed, required imaging and test results available.  Performed  MAC and 3 Medications:  Fentanyl 100 mcg Etomidate 20 mg Versed 2 mg NMB Rocuronium 50 mg    Evaluation Hemodynamic Status: BP stable throughout; O2 sats: stable throughout Patient's Current Condition: stable Complications: No apparent complications Patient did tolerate procedure well. Chest X-ray ordered to verify placement.  CXR: pending.   [name] Minor ACNP [facility] PCCM Pager [phone] till 3 pm If no answer page [phone] 11/17/2015, 7:19 AM

## 2015-11-17 NOTE — Progress Notes (Signed)
During peri care  RN noted pt   became  very lethargic with minimal response to verbal /tactile stimuli and unable  to follow command  though vs  were WN.L. R. RN called  RT to come check on respiratory status looked flushed in the face with no respiratory distress. RT came in  changed  Venti mask 50 % to Bipap.with 6 liters oxygen but pt continue to desaturate from 91 %   To 88 and mid 42's  [facility]  Md on call notified .

## 2015-11-17 NOTE — Progress Notes (Addendum)
RN paged this NP with abnormal CT head results. NSU has been following pt since admit on 8/4 for a SDH resulting from a fall. CT head on 8/6 showed improvement. Pt was under care by critical care and transferred to [facility] service on 8/9. CT head shows increase in SDH and increase in shift from 9 to 27mm. Pt has become symptomatic in past 1.5 hrs per RN. At 4am, pt was responsive, talking, etc. By 5:30, she was more lethargic, just moaning, and not following commands. No respiratory distress or desaturation at that time.  As this NP is not on campus at [facility], NP called colleague Lamar Blinks, NP to see pt and handle situation from here.  KJKG, NP [facility] Update: Schorr ordered ABG which RN called to this NP. After seeing results, wrote order to TF pt to SDU for continuous bipap.  However, NSU was reached and is talking to the family first before moving pt or taking further action. Schorr had called PCCM as well who also want to wait on TF until NSU talks to family and sees whether pt needs ICU or just SDU. Spoke to [place], RRRN who is at bedside and related all this to this NP. Schorr has been handling issue. Pt is now on continuous bipap at bedside per RT. Jerene Pitch is also at bedside and staying to monitor pt until NSU calls back.  KJKG, NP [facility]

## 2015-11-17 NOTE — Consult Note (Signed)
Cardiology Consult Note  Admit date: 11/10/2015 Name: Laura [name] 82 y.o.  female DOB:  09/09/1929 MRN:  [id number]  Today's date:  11/17/2015  Referring Physician:    Critical care  Primary Physician:    Dr. Joylene Draft  Reason for Consultation:    Congestive heart failure  IMPRESSIONS: 1.  Acute subdural hematoma due to mechanical fall 2.  Probable predominantly diastolic congestive heart failure with possible some mild component of systolic although to my eye the ejection fraction is much higher than what is recorded in the chart by previous physician 3.  Coronary artery disease with remote inferior infarction treated with stenting in 2003 4.  Chronic respiratory failure with hypercarbia 5.  Obesity hypoventilation syndrome 6.  Mild troponin elevation due to demand ischemia  RECOMMENDATION: I personally reviewed the echo.  Her ejection fraction appears to me to be more in the 50% range.  There may be very slight inferior wall motion hypokinesis but she has a previous history of an inferior infarct.  She does have a very large atrium and also has mitral regurgitation on her echo.  She is having some frequent PACs.  She is not a candidate for cardiac intervention or further cardiac therapy at this point a lot of her other comorbidities.  I think her respiratory status is multifactorial.  She would benefit from diuresis and would judge this based upon her level of renal function.   I will review the echo again with the reading physician I think the ejection fraction is somewhat higher.  Check EKG and continue to diurese.  HISTORY: I was asked to see this 82 year old female for cardiac evaluation.  She is well-known to me.  She had an inferior infarction 2003 treated with stenting of the right coronary artery.  There was minimal disease in the other vessels at that time.  She made a fairly good recovery following that but is had significant obesity through the years.  She developed acute  hypercarbic respiratory failure and has had significant respiratory failure that has been chronically followed by pulmonology.  She had a significant admission last year and has been unsteady on her feet.  She was admitted when she had a mechanical fall resulting in a subdural hematoma.  She got well enough to be transferred to the floor but had worsening somnolence and respiratory distress and was admitted and intubated.  She was felt to be in combined pulmonary edema as well as acute hypercarbic respiratory failure and an echocardiogram reported showing an EF of 40-45% and I was asked to see her.  She is currently intubated and on the ventilator.  She evidently was felt to have some increase in her subdural hematoma but a decision is been made not to pursue surgical intervention.  I cannot obtain any history at the present time but she'll vital signs are stable.  Her beta blockers were discontinued and she now has a resting tachycardia.  Past Medical History:  Diagnosis Date  . Acute encephalopathy    resolved  . Anemia   . Asthma   . Chronic bronchitis   . Chronic diastolic (congestive) heart failure ([place])   . Complication of anesthesia   . COPD (chronic obstructive pulmonary disease) ([place])   . Coronary artery disease   . Depression   . Hydronephrosis   . Hypertension    pulmonary hypotension secondary to chronic heart/lung disease  . Kidney stone   . Myocardial infarction ([place])   . Nephrolithiasis   . Obesity  hypoventilation syndrome ([place])   . Peripheral vascular disease ([place])   . PONV (postoperative nausea and vomiting)   . Sleep apnea   . Subclinical hypothyroidism   . Thoracic aortic aneurysm ([place])   . Urinary tract infection       Past Surgical History:  Procedure Laterality Date  . APPENDECTOMY    . CORONARY STENT PLACEMENT    . CYSTOSCOPY W/ URETERAL STENT PLACEMENT Right 04/04/2013   Procedure: CYSTOSCOPY WITH RETROGRADE PYELOGRAM/URETERAL STENT PLACEMENT;  Surgeon:  Molli Hazard, MD;  Location: [facility];  Service: Urology;  Laterality: Right;  . CYSTOSCOPY WITH RETROGRADE PYELOGRAM, URETEROSCOPY AND STENT PLACEMENT Right 04/30/2013   Procedure: CYSTOSCOPY WITH RETROGRADE PYELOGRAM, URETEROSCOPY AND STENT EXCHANGE;  Surgeon: Molli Hazard, MD;  Location: [facility];  Service: Urology;  Laterality: Right;  . HOLMIUM LASER APPLICATION Right [id number]   Procedure: HOLMIUM LASER APPLICATION;  Surgeon: Molli Hazard, MD;  Location: [facility];  Service: Urology;  Laterality: Right;  . REPLACEMENT TOTAL KNEE BILATERAL    . TONSILLECTOMY       Allergies:  is allergic to codeine; hydrocodone; and morphine and related.   Medications: Prior to Admission medications   Medication Sig Start Date End Date Taking? Authorizing Provider  acetaminophen (TYLENOL) 500 MG tablet Take 1,000 mg by mouth every 6 (six) hours as needed for mild pain.    Yes Historical Provider, MD  albuterol (PROVENTIL HFA;VENTOLIN HFA) 108 (90 BASE) MCG/ACT inhaler Inhale 2 puffs into the lungs every 4 (four) hours as needed for wheezing (cough).    Yes Historical Provider, MD  atorvastatin (LIPITOR) 40 MG tablet Take 40 mg by mouth daily. 07/20/15  Yes Historical Provider, MD  ezetimibe (ZETIA) 10 MG tablet Take 10 mg by mouth daily.    Yes Historical Provider, MD  fluticasone (FLOVENT HFA) 110 MCG/ACT inhaler Inhale 2 puffs into the lungs 2 (two) times daily. 07/13/15  Yes Magdalen Spatz, NP  GARCINIA CAMBOGIA-CHROMIUM PO Take 1 tablet by mouth 2 (two) times daily.   Yes Historical Provider, MD  metoprolol tartrate (LOPRESSOR) 25 MG tablet Take 25 mg by mouth 2 (two) times daily. For HTN 08/11/13  Yes Erick Colace, NP  montelukast (SINGULAIR) 10 MG tablet Take 10 mg by mouth every morning. For COPD/Asthma   Yes Historical Provider, MD  OXYGEN Inhale 2.5 L into the lungs See admin instructions. Use continuously except when using CPAP   Yes Historical Provider, MD  [place] into the lungs at bedtime.   Yes Historical Provider, MD  sertraline (ZOLOFT) 50 MG tablet Take 50 mg by mouth daily with breakfast. For depression 04/03/13  Yes Historical Provider, MD  Vitamin D, Ergocalciferol, (DRISDOL) 50000 UNITS CAPS capsule Take 50,000 Units by mouth every Wednesday.    Yes Historical Provider, MD  allopurinol (ZYLOPRIM) 100 MG tablet Take 100 mg by mouth daily as needed (gout).    Historical Provider, MD  amLODipine (NORVASC) 5 MG tablet Take 1 tablet (5 mg total) by mouth daily. 08/11/13   Erick Colace, NP  aspirin 81 MG tablet Take 81 mg by mouth at bedtime.     Historical Provider, MD  colchicine 0.6 MG tablet Take 0.6 mg by mouth daily as needed (gout.).    Historical Provider, MD  Cyanocobalamin (VITAMIN B 12 PO) Take 1,000 mcg by mouth daily.    Historical Provider, MD  denosumab (PROLIA) 60 MG/ML SOLN injection Inject 60 mg into the skin every 6 (six)  months. Administer in upper arm, thigh, or abdomen    Historical Provider, MD  dextromethorphan-guaiFENesin (MUCINEX DM) 30-600 MG 12hr tablet Take 1 tablet by mouth daily as needed for cough.    Historical Provider, MD  ferrous sulfate 325 (65 FE) MG tablet Take 325 mg by mouth daily with breakfast.    Historical Provider, MD  furosemide (LASIX) 40 MG tablet Take 20 mg by mouth. On every third or fourth day.    Historical Provider, MD  nitroGLYCERIN (NITROSTAT) 0.4 MG SL tablet Place 0.4 mg under the tongue every 5 (five) minutes as needed for chest pain.    Historical Provider, MD  ondansetron (ZOFRAN-ODT) 8 MG disintegrating tablet Take 8 mg by mouth every 8 (eight) hours as needed for nausea.  03/31/13   Historical Provider, MD  pantoprazole (PROTONIX) 40 MG tablet Take 40 mg by mouth daily. For GERD 08/11/13   Erick Colace, NP  potassium chloride (K-DUR,KLOR-CON) 10 MEQ tablet Take 10 mEq by mouth daily. On days she takes furosemide only.    Historical Provider, MD  Probiotic Product (PROBIOTIC  PO) Take 10 mg by mouth daily.    Historical Provider, MD  tamsulosin (FLOMAX) 0.4 MG CAPS capsule Take 0.4 mg by mouth daily.  04/07/13   Orson Eva, MD    Family History: Family Status  Relation Status  . Mother Deceased  . Father Deceased    Social History:   reports that she is a non-smoker but has been exposed to tobacco smoke. She has never used smokeless tobacco. She reports that she does not drink alcohol or use drugs.   Social History   Social History Narrative   Originally from [place]. Always lived in [place]. No internation travel. Always a homemaker. Remote bird exposure.     Review of Systems: Not obtainable due to intubation  Physical Exam: BP 131/88   Pulse 98   Temp (!) 101 F (38.3 C) (Oral)   Resp (!) 24   Ht 5\' 3"  (1.6 m)   Wt 90.7 kg (199 lb 15.3 oz)   SpO2 94%   BMI 35.42 kg/m   General appearance: She is an elderly female who is sedated somewhat lethargic on the ventilator but will open eyes to voice Head: Normocephalic, without obvious abnormality, There is ecchymoses present over her posterior neck as well as her forehead and scalp Eyes: conjunctivae/corneas clear. PERRL, EOM's intact. Fundi benign. Neck: no adenopathy, no carotid bruit, supple, symmetrical, trachea midline and Very difficult to assess JVD Lungs: Reduced breath sounds bilaterally Heart: Regular rhythm with some irregular beats, normal S1-S2 no S3 or murmur Abdomen: soft, non-tender; bowel sounds normal; no masses,  no organomegaly Pelvic: deferred Extremities: Changes of bilateral venous insufficiency are noted, no edema, Pulses: Her pedal pulses are 1-2+ and femorals are 2+ Skin: There are ecchymoses present over her neck and some over her forearms changes of venous insufficiency noted over her legs Neurologic: Grossly normal   Labs: CBC  Recent Labs  11/17/15 0300  WBC 7.9  RBC 4.56  HGB 13.5  HCT 46.0  PLT 120*  MCV 100.9*  MCH 29.6  MCHC 29.3*  RDW 13.9    CMP   Recent Labs  11/17/15 0300  NA 140  K 3.8  CL 94*  CO2 39*  GLUCOSE 134*  BUN 26*  CREATININE 0.86  CALCIUM 8.8*  GFRNONAA >60  GFRAA >60   BNP (last 3 results) BNP    Component Value Date/Time   BNP  796.5 (H) 11/17/2015 0300   Cardiac Panel (last 3 results)  Recent Labs  11/14/15 2340 11/15/15 0544  TROPONINI 0.09* 0.09*     Radiology:  Already megaly, by basilar opacities noted  EKG: Sinus rhythm with PACs, LVH, nonspecific ST and T-wave changes  Signed:  W. Doristine Church MD [facility] [place]   Cardiology Consultant  11/17/2015, 8:07 PM

## 2015-11-17 NOTE — Progress Notes (Signed)
eLink Physician-Brief Progress Note Patient Name: Laura [name] DOB: 27-Feb-1930 MRN: [id number]   Date of Service  11/17/2015  HPI/Events of Note  Multiple issues: 1. Agitation and 2. Fever - has Tylenol PO ordered. Unable to place OGT or NGT.   eICU Interventions  Will order: 1. Increase Fentanyl to 50-100 mcg IV Q 1 hour PRN. 2. D/C Tylenol PO. 3. Tylenol Suppository 650 mg PR Q 6 hours PRN Temp > 101.0 F     Intervention Category Minor Interventions: Agitation / anxiety - evaluation and management;Routine modifications to care plan (e.g. PRN medications for pain, fever)  Jaymar Loeber [place] 11/17/2015, 10:03 PM

## 2015-11-17 NOTE — Progress Notes (Signed)
Inpatient Rehabilitation  Note change in status over night and that patient is currently intubated.  Plan to follow along at a distance for therapy recommendations.  Please call with questions.   Carmelia Roller., CCC/SLP Admission Coordinator  [facility]  Cell [phone]

## 2015-11-17 NOTE — Progress Notes (Signed)
Nursing unable to successfully pass NG/OT tube.  MD aware.  Family reports "stricture" and "having to have esophagus stretched in the past".  Advised Dr. Chase Caller of difficulty.  Ordered obtained for radiology to insert NG/OG tube for medications and initiation of tube feeds as appropriate.  Advised radiology department of unit difficulty and family information obtained per request of Dr. Chase Caller.

## 2015-11-17 NOTE — Progress Notes (Signed)
Patient ID: EMAH DAHIR, female   DOB: [birth year], 82 y.o.   MRN: [id number] Events of the night noted appreciate medicine critical-care's management. Currently the patient's heavily sedated from the intubation exam is only pupils are small and reactive to no movement to noxious stimulation. I've had extensive discussions with the team inclusive of critical care medicine as well as the family. I think the etiology of this is multifactorial it is a combination of very slight increase in swelling around the subdural also possibly pulmonary edema and respiratory collapse. I do not think it's clearly related to the slight change in the subdural hematoma so therefore it is unclear whether craniotomy and evacuation could significantly reverse this progression. The family certainly is reluctant to want to proceed forward with any aggressive interaction including surgery. The patient apparently did state earlier in the week when she was doing much better that she had no interest in any more surgery. So the family is discussing this amongst themselves I will be meeting with lunch they will notify me if they come to any conclusions prior to that meeting. At this point recommend continue aggressive supportive care unfortunately I think the prognosis is very grave

## 2015-11-17 NOTE — Progress Notes (Signed)
CRITICAL VALUE ALERT  Critical value received:  ABG  Date of notification:  11/17/15  Time of notification:  0600  Critical value read back yes  Nurse who received alert:  Carin Hock  MD notified (1st page):  Schorr  K  Time of first page:  0615  MD notified (2nd page):  Time of second page: 06  Responding MD:  Lamar Blinks  Time MD [id number]

## 2015-11-17 NOTE — Progress Notes (Signed)
Change in patient neuro status repeat CT head result  Also shows a change  [facility] on cal made aware. Neuro surgical  [facility] Neurosurgery paged on amion and  Call made to answering service x 2 with no response yet.

## 2015-11-17 NOTE — Progress Notes (Addendum)
Called received per floor RN at [phone] for Pt with acute change in mental status since earlier this morning. Per floor RN unable to reach MD on call to relay critical ABG results. Upon my arrival Pt on bipap machine, minimally responsive to sternal rub. 02 sat 90 % HR 60s. [facility] NP K. Schor paged, call back received quickly. Per NP awaiting update from Neurosurgery regarding family decision on intubation and possible surgery. Pt placed on full bipap support per RT at bedside while waiting. Call received per Dr. Cyndy Freeze regarding Pt transfer to ICU for intubation. Bed obtained on 2 M STAT. Pt transfered to 93m9, met by PCCM team on unit preparing for intubation. Bedside report provided to ICU RN per ESherlynn Stalls64M RN. [facility] NP K. Schor updated on pt status and transfer to ICU.

## 2015-11-17 NOTE — Progress Notes (Signed)
PULMONARY / CRITICAL CARE MEDICINE   Name: Laura [name] MRN: [id number] DOB: 1929-06-10    ADMISSION DATE:  11/09/2015   REFERRING MD:  [facility]  CHIEF COMPLAINT:  SDH  HISTORY OF PRESENT ILLNESS:   82 yo admitted 8/4 with SDH from fall. NS followed without surgical intervention needed. Transferred to floor and to [facility] service 8/8. During the night of 8/10 she became somnolent and CT scan revealed increased left SDH and she required intubation for airway protection. Family to decide how aggressive her care is to be. NS to evaluate.   SUBJECTIVE:  Intubated on vent  VITAL SIGNS: BP (!) 143/74   Pulse 95   Temp 97.6 F (36.4 C) (Oral)   Resp 20   Ht 5\' 3"  (1.6 m)   Wt 199 lb 1.2 oz (90.3 kg)   SpO2 94%   BMI 35.26 kg/m   HEMODYNAMICS:    VENTILATOR SETTINGS: Vent Mode: PRVC FiO2 (%):  [2 %-50 %] 50 % Set Rate:  [18 bmp] 18 bmp Vt Set:  [420 mL] 420 mL PEEP:  [5 cmH20] 5 cmH20  INTAKE / OUTPUT: I/O last 3 completed shifts: In: 90 [P.O.:50] Out: 550 [Urine:550]  PHYSICAL EXAMINATION: General:  Obese WF obtunded Neuro:  MAE x 4, non verbal, now intubated HEENT:  ETT-> vent Cardiovascular: HSR RRR Lungs:  Coarse with wheezes thru out  Abdomen:  *OBESE + BS Musculoskeletal:  Itact Skin:  Cool and dry, vascular changes lower ext LABS:  BMET  Recent Labs Lab 11/15/15 0544 11/16/15 0652 11/17/15 0300  NA 142 141 140  K 4.4 4.0 3.8  CL 100* 99* 94*  CO2 36* 34* 39*  BUN 36* 32* 26*  CREATININE 0.76 0.76 0.86  GLUCOSE 101* 86 134*    Electrolytes  Recent Labs Lab 11/12/15 0351 11/14/15 1829 11/15/15 0544 11/16/15 0652 11/16/15 0804 11/17/15 0300  CALCIUM 9.1 9.5 9.2 8.8*  --  8.8*  MG 1.9 2.2  --   --  1.9 2.0  PHOS 3.4  --   --   --  2.7  --     CBC  Recent Labs Lab 11/12/15 0351 11/16/15 0652 11/17/15 0300  WBC 8.3 7.1 7.9  HGB 12.3 13.1 13.5  HCT 41.3 43.7 46.0  PLT 98* 125* 120*    Coag's  Recent Labs Lab 11/26/2015 2320   APTT 40*  INR 0.98    Sepsis Markers No results for input(s): LATICACIDVEN, PROCALCITON, O2SATVEN in the last 168 hours.  ABG  Recent Labs Lab 11/17/15 0558  PHART 7.249*  PCO2ART 101*  PO2ART 55.3*    Liver Enzymes No results for input(s): AST, ALT, ALKPHOS, BILITOT, ALBUMIN in the last 168 hours.  Cardiac Enzymes  Recent Labs Lab 11/14/15 1829 11/14/15 2340 11/15/15 0544  TROPONINI 0.09* 0.09* 0.09*    Glucose No results for input(s): GLUCAP in the last 168 hours.  Imaging Ct Head Wo Contrast  Result Date: 11/17/2015 CLINICAL DATA:  Follow-up examination for subdural hematoma. EXAM: CT HEAD WITHOUT CONTRAST TECHNIQUE: Contiguous axial images were obtained from the base of the skull through the vertex without intravenous contrast. COMPARISON:  Prior CT from 11/13/2015. FINDINGS: Previously identified holo hemispheric mixed density left subdural hematoma again seen. This is relatively slightly increased in size measuring up to 12 mm in maximal thickness. Slightly decreased hyperdense blood products within this collection. Trace parafalcine hemorrhage has largely resolved. Trace tentorial hemorrhage again noted. Persistent mass effect on the left cerebral hemisphere with partial effacement  of the left lateral ventricle. Left-to-right shift measures 12 mm. No frank ventricular entrapment. Similar dilatation of the right lateral ventricle. Effacement of the basilar cisterns is slightly worsened. No acute abnormality about the globes and orbits. Paranasal sinuses remain clear.  No mastoid effusion. Calvarium intact. Evolving left frontal scalp contusion noted.  No calvarial fracture. No acute parenchymal hemorrhage.  No acute infarct. IMPRESSION: Slightly increased size of mixed density left subdural hematoma without evidence of interval re-bleeding. Slightly worsened left-to-right shift now measuring up to 12 mm. Basilar cistern crowding has also slightly progressed as compared to  previous. Electronically Signed   By: Jeannine Boga M.D.   On: 11/17/2015 03:11   Dg Chest Port 1 View  Result Date: 11/16/2015 CLINICAL DATA:  Shortness of breath since noon today. History of MI, CHF, COPD. EXAM: PORTABLE CHEST 1 VIEW COMPARISON:  08/10/2013 FINDINGS: Heart is enlarged. There are bilateral pleural effusions. Increased bibasilar opacities now obscure the hemidiaphragms bilaterally, left greater than right. There is mild pulmonary vascular congestion but no significant pulmonary edema. IMPRESSION: 1. Stable cardiomegaly. 2. Increased bilateral opacities consistent with infectious infiltrates or atelectasis and pleural effusions. Electronically Signed   By: Nolon Nations M.D.   On: 11/16/2015 16:59     STUDIES:  8/10 CT head with expanded L sdh  CULTURES:   ANTIBIOTICS:   SIGNIFICANT EVENTS: 8/10 intubated   LINES/TUBES: 8/10 ETT>>  DISCUSSION: 82 yo admitted 8/4 with SDH from fall. NS followed without surgical intervention needed. Transferred to floor and to [facility] service 8/8. During the night of 8/10 she became somnolent and CT scan revealed increased left SDH and she required intubation for airway protection. Family to decide how aggressive her care is to be. NS to evaluate.  ASSESSMENT / PLAN:  PULMONARY A: VDRF secondary to expanding SDH with inability to protect airway OSA COPD P:   Vent bundle BD's Solumedrol  Will need bipap if extubated   CARDIOVASCULAR A:  CAD Recent SVT P:  Tele   RENAL Lab Results  Component Value Date   CREATININE 0.86 11/17/2015   CREATININE 0.76 11/16/2015   CREATININE 0.76 11/15/2015   CREATININE 0.78 05/05/2013    A:  No acute issue  P:   Follow creatine  GASTROINTESTINAL A:   GI protection P:   H2 blocker  HEMATOLOGIC A:   Fall with SDH P:  Follow coags  INFECTIOUS A:   No overt infectious process P:     ENDOCRINE   A:   No acute issue P:   Follow glucose  NEUROLOGIC A:    SDH secondary to fall 8/10 somnolent with CT scan revealing increased SDH and required transfer to ICU and intubation. NS to evaluate. P:   RASS goal: 0 Minimal sedation insetting of expanding SDH   FAMILY  - Updates: No family at bedside. In route for conference for clarification of code status.  - Inter-disciplinary family meet or Palliative Care meeting due by:  8/17   [name] Moussa Wiegand ACNP Maryanna Shape PCCM Pager [phone] till 3 pm If no answer page [phone] 11/17/2015, 7:22 AM

## 2015-11-18 ENCOUNTER — Inpatient Hospital Stay (HOSPITAL_COMMUNITY): Payer: Medicare Other

## 2015-11-18 LAB — GLUCOSE, CAPILLARY
GLUCOSE-CAPILLARY: 163 mg/dL — AB (ref 65–99)
GLUCOSE-CAPILLARY: 148 mg/dL — AB (ref 65–99)
Glucose-Capillary: 146 mg/dL — ABNORMAL HIGH (ref 65–99)
Glucose-Capillary: 157 mg/dL — ABNORMAL HIGH (ref 65–99)
Glucose-Capillary: 166 mg/dL — ABNORMAL HIGH (ref 65–99)

## 2015-11-18 LAB — BASIC METABOLIC PANEL
ANION GAP: 14 (ref 5–15)
BUN: 31 mg/dL — ABNORMAL HIGH (ref 6–20)
CALCIUM: 9 mg/dL (ref 8.9–10.3)
CO2: 36 mmol/L — ABNORMAL HIGH (ref 22–32)
Chloride: 91 mmol/L — ABNORMAL LOW (ref 101–111)
Creatinine, Ser: 1.2 mg/dL — ABNORMAL HIGH (ref 0.44–1.00)
GFR, EST AFRICAN AMERICAN: 47 mL/min — AB (ref >60)
GFR, EST NON AFRICAN AMERICAN: 41 mL/min — AB (ref >60)
GLUCOSE: 168 mg/dL — AB (ref 65–99)
Potassium: 3.6 mmol/L (ref 3.5–5.1)
Sodium: 141 mmol/L (ref 135–145)

## 2015-11-18 LAB — CBC WITH DIFFERENTIAL/PLATELET
BASOS ABS: 0 10*3/uL (ref 0.0–0.1)
BASOS PCT: 0 %
EOS ABS: 0 10*3/uL (ref 0.0–0.7)
EOS PCT: 0 %
HCT: 41.6 % (ref 36.0–46.0)
Hemoglobin: 12.9 g/dL (ref 12.0–15.0)
Lymphocytes Relative: 3 %
Lymphs Abs: 0.3 10*3/uL — ABNORMAL LOW (ref 0.7–4.0)
MCH: 29.6 pg (ref 26.0–34.0)
MCHC: 31 g/dL (ref 30.0–36.0)
MCV: 95.4 fL (ref 78.0–100.0)
MONO ABS: 0.5 10*3/uL (ref 0.1–1.0)
Monocytes Relative: 5 %
NEUTROS ABS: 9.1 10*3/uL — AB (ref 1.7–7.7)
NEUTROS PCT: 92 %
PLATELETS: 125 10*3/uL — AB (ref 150–400)
RBC: 4.36 MIL/uL (ref 3.87–5.11)
RDW: 14 % (ref 11.5–15.5)
WBC: 9.8 10*3/uL (ref 4.0–10.5)

## 2015-11-18 LAB — PROCALCITONIN: Procalcitonin: <0.1 ng/mL

## 2015-11-18 LAB — LACTIC ACID, PLASMA: LACTIC ACID, VENOUS: 1.5 mmol/L (ref 0.5–1.9)

## 2015-11-18 MED ORDER — DEXMEDETOMIDINE HCL IN NACL 400 MCG/100ML IV SOLN
0.0000 ug/kg/h | INTRAVENOUS | Status: DC
  Administered 2015-11-18: 1.1 ug/kg/h via INTRAVENOUS
  Administered 2015-11-18: 1 ug/kg/h via INTRAVENOUS
  Administered 2015-11-18: 0.4 ug/kg/h via INTRAVENOUS
  Administered 2015-11-18 (×3): 1.2 ug/kg/h via INTRAVENOUS
  Administered 2015-11-19: 1.3 ug/kg/h via INTRAVENOUS
  Administered 2015-11-19 (×3): 1.5 ug/kg/h via INTRAVENOUS
  Filled 2015-11-18: qty 50
  Filled 2015-11-18 (×2): qty 100
  Filled 2015-11-18 (×2): qty 50
  Filled 2015-11-18 (×4): qty 100
  Filled 2015-11-18: qty 50

## 2015-11-18 MED ORDER — FAMOTIDINE IN NACL 20-0.9 MG/50ML-% IV SOLN
20.0000 mg | INTRAVENOUS | Status: DC
  Administered 2015-11-19: 20 mg via INTRAVENOUS
  Filled 2015-11-18 (×2): qty 50

## 2015-11-18 MED ORDER — SODIUM CHLORIDE 0.9% FLUSH
10.0000 mL | Freq: Two times a day (BID) | INTRAVENOUS | Status: DC
  Administered 2015-11-19 – 2015-11-20 (×4): 10 mL
  Administered 2015-11-21: 3 mL

## 2015-11-18 MED ORDER — DEXTROSE 5 % IV SOLN
2.0000 g | INTRAVENOUS | Status: DC
  Administered 2015-11-18 – 2015-11-20 (×3): 2 g via INTRAVENOUS
  Filled 2015-11-18 (×4): qty 2

## 2015-11-18 MED ORDER — SODIUM CHLORIDE 0.9% FLUSH: 10.0000 mL | INTRAVENOUS | Status: DC | PRN

## 2015-11-18 MED ORDER — PIPERACILLIN-TAZOBACTAM 3.375 G IVPB
3.3750 g | Freq: Three times a day (TID) | INTRAVENOUS | Status: DC
  Filled 2015-11-18 (×2): qty 50

## 2015-11-18 MED ORDER — LIDOCAINE VISCOUS 2 % MT SOLN
15.0000 mL | Freq: Once | OROMUCOSAL | Status: AC
  Administered 2015-11-18: 5 mL via OROMUCOSAL
  Filled 2015-11-18: qty 15

## 2015-11-18 MED ORDER — DIATRIZOATE MEGLUMINE & SODIUM 66-10 % PO SOLN
30.0000 mL | Freq: Once | ORAL | Status: AC
  Administered 2015-11-18: 20 mL via ORAL
  Filled 2015-11-18: qty 30

## 2015-11-18 MED ORDER — DIATRIZOATE MEGLUMINE & SODIUM 66-10 % PO SOLN
ORAL | Status: AC
  Administered 2015-11-18: 20 mL via ORAL
  Filled 2015-11-18: qty 30

## 2015-11-18 MED ORDER — FUROSEMIDE 10 MG/ML IJ SOLN
40.0000 mg | Freq: Every day | INTRAMUSCULAR | Status: DC
  Administered 2015-11-19: 40 mg via INTRAVENOUS
  Filled 2015-11-18 (×2): qty 4

## 2015-11-18 MED ORDER — SODIUM CHLORIDE 0.9% FLUSH
10.0000 mL | Freq: Two times a day (BID) | INTRAVENOUS | Status: DC
  Administered 2015-11-18: 10 mL
  Administered 2015-11-18: 20 mL
  Administered 2015-11-21: 10 mL

## 2015-11-18 MED ORDER — LIDOCAINE VISCOUS 2 % MT SOLN
OROMUCOSAL | Status: AC
  Administered 2015-11-18: 5 mL via OROMUCOSAL
  Filled 2015-11-18: qty 15

## 2015-11-18 NOTE — Progress Notes (Signed)
Initial Nutrition Assessment  DOCUMENTATION CODES:   Obesity unspecified  INTERVENTION:    When enteral feeding tube placed by IR, recommend initiate TF with Vital High Protein at goal rate of 50 ml/h (1200 ml per day) to provide 1200 kcals, 105 gm protein, 1003 ml free water daily.  NUTRITION DIAGNOSIS:   Inadequate oral intake related to inability to eat as evidenced by NPO status.  GOAL:   Provide needs based on ASPEN/SCCM guidelines  MONITOR:   Vent status, Labs, Weight trends, I & O's, Skin  REASON FOR ASSESSMENT:   Ventilator    ASSESSMENT:   82 yo admitted 8/4 with SDH from fall. NS followed without surgical intervention needed. Transferred to floor and to [facility] service 8/8. During the night of 8/10 she became somnolent and CT scan revealed increased left SDH and she required intubation for airway protection.  Labs reviewed. CBG's: [phone] Medications reviewed and include Lasix and Vitamin D. Nutrition-Focused physical exam completed. Findings are no fat depletion, no muscle depletion, and no edema.  Patient is currently intubated on ventilator support Temp (24hrs), Avg:99.6 F (37.6 C), Min:98.6 F (37 C), Max:101 F (38.3 C)  Patient currently with no enteral access. RN was unable to place NGT or OGT due to esophageal stricture. Radiology to place enteral tube today.   Diet Order:  Diet NPO time specified  Skin:  Wound (see comment) (hematoma to head)  Last BM:  unknown  Height:   Ht Readings from Last 1 Encounters:  11/14/15 5\' 3"  (1.6 m)    Weight:   Wt Readings from Last 1 Encounters:  11/18/15 195 lb 1.7 oz (88.5 kg)    Ideal Body Weight:  52.3 kg  BMI:  Body mass index is 34.56 kg/m.  Estimated Nutritional Needs:   Kcal:  [id number]  Protein:  105 gm  Fluid:  >/= 1.5 L  EDUCATION NEEDS:   No education needs identified at this time  Molli Barrows, [place], [place], [place] Pager [phone] After Hours Pager [phone]

## 2015-11-18 NOTE — Care Management Important Message (Signed)
Important Message  Patient Details  Name: Laura [name] MRN: [id number] Date of Birth: 06-Feb-1930   Medicare Important Message Given:  Yes    Nathen May 11/18/2015, 10:38 AM

## 2015-11-18 NOTE — Progress Notes (Signed)
Peripherally Inserted Central Catheter/Midline Placement  The IV Nurse has discussed with the patient and/or persons authorized to consent for the patient, the purpose of this procedure and the potential benefits and risks involved with this procedure.  The benefits include less needle sticks, lab draws from the catheter and patient may be discharged home with the catheter.  Risks include, but not limited to, infection, bleeding, blood clot (thrombus formation), and puncture of an artery; nerve damage and irregular heat beat.  Alternatives to this procedure were also discussed.  PICC/Midline Placement Documentation     Telephone consent by Daughter   Synthia Innocent 11/18/2015, 3:33 PM

## 2015-11-18 NOTE — Progress Notes (Signed)
Pharmacy Antibiotic Note  Laura [name] is a 82 y.o. female admitted on 11/29/2015 with SDH. Patient spiked a fever on late on 8/10. Pharmacy has been consulted for cefepime dosing for empiric PNA coverage.  Patient's current temp is 98.32F and WBC wnl. PCT < 0.10 ng/mL and lactic acid 1.5 mmol/L. Her SCr on admission was 0.59 and has increased to 1.20 mg/dL. MRSA PCR is negative.   Plan: - Cefepime 2g IV q24h  - Monitor renal function, clinical progress, and length of therapy.   Height: 5\' 3"  (160 cm) Weight: 195 lb 1.7 oz (88.5 kg) IBW/kg (Calculated) : 52.4  Temp (24hrs), Avg:99.6 F (37.6 C), Min:98.6 F (37 C), Max:101 F (38.3 C)   Recent Labs Lab 12/04/2015 2320 11/12/15 0351 11/14/15 1829 11/15/15 0544 11/16/15 0652 11/17/15 0300 11/18/15 0249  WBC 6.6 8.3  --   --  7.1 7.9 9.8  CREATININE 0.69 0.59 1.03* 0.76 0.76 0.86 1.20*    Estimated Creatinine Clearance: 37.5 mL/min (by C-G formula based on SCr of 1.2 mg/dL).    Allergies  Allergen Reactions  . Codeine Nausea And Vomiting  . Hydrocodone Other (See Comments)    delusions  . Morphine And Related Other (See Comments)    Extremely cofused    Antimicrobials this admission: Cefepime 8/11 >>  Dose adjustments this admission: N/A  Microbiology results: 8/5 MRSA PCR: negative  Thank you for allowing pharmacy to be a part of this patient's care.  Demetrius Charity, PharmD Acute Care Pharmacy Resident  Pager: [phone] 11/18/2015

## 2015-11-18 NOTE — Progress Notes (Signed)
Subjective:  Developed fever and agitation last night.  Remains sedated intubated and on the ventilator.  Not able to answer questions vaguely will open eyes to voice  Objective:  Vital Signs in the last 24 hours: BP 139/72   Pulse 93   Temp 98.8 F (37.1 C) (Oral)   Resp 18   Ht 5\' 3"  (1.6 m)   Wt 88.5 kg (195 lb 1.7 oz)   SpO2 95%   BMI 34.56 kg/m   Physical Exam: Elderly white female intubated and sedated on ventilator Lungs:  Reduced breath sounds Cardiac: Irregular rhythm, normal S1 and S2, no S3, 1 to 2/6 systolic murmur at apex Abdomen:  Soft, nontender, no masses Extremities:  No edema present, changes of venous stasis noted.  Intake/Output from previous day: 08/10 0701 - 08/11 0700 In: 927.5 [I.V.:212.5; IV Piggyback:715] Out: [id number] [Urine:4965] Weight Filed Weights   11/17/15 0513 11/17/15 0740 11/18/15 0200  Weight: 90.3 kg (199 lb 1.2 oz) 90.7 kg (199 lb 15.3 oz) 88.5 kg (195 lb 1.7 oz)    Lab Results: Basic Metabolic Panel:  Recent Labs  11/17/15 0300 11/18/15 0249  NA 140 141  K 3.8 3.6  CL 94* 91*  CO2 39* 36*  GLUCOSE 134* 168*  BUN 26* 31*  CREATININE 0.86 1.20*    CBC:  Recent Labs  11/17/15 0300 11/18/15 0249  WBC 7.9 9.8  NEUTROABS  --  9.1*  HGB 13.5 12.9  HCT 46.0 41.6  MCV 100.9* 95.4  PLT 120* 125*    BNP    Component Value Date/Time   BNP 796.5 (H) 11/17/2015 0300    Cardiac Panel (last 3 results)  Telemetry: sinus tachycardia with PAC's frequent and runs of nonsustained SVT  Assessment/Plan:  1.  Acute on chronic hypercarbic respiratory failure-I think this is a mixed picture.  She has severe CO2 retention.  I suspect this is more diastolic rather than systolic heart failure.  Creatinine is up some this morning so will need to be careful with diuresis.  She is down 4 pounds already 2.  Coronary artery disease with previous inferior infarction treated with stenting many years ago 3.  Frequent PACs-she is high risk  for the development of atrial fibrillation-if possible would be good to start on some sort of AV nodal blocking agent either beta blocker or diltiazem 4.  Subdural hematoma  W. Doristine Church  MD [facility]  11/18/2015, 8:35 AM

## 2015-11-18 NOTE — Progress Notes (Signed)
eLink Physician-Brief Progress Note Patient Name: Laura [name] DOB: October 16, 1929 MRN: [id number]   Date of Service  11/18/2015  HPI/Events of Note  Agitation.  eICU Interventions  Will increase Precedex IV infusion ceiling dose to 1.5 mcg/kg/hour.     Intervention Category Minor Interventions: Agitation / anxiety - evaluation and management  Lysle Dingwall 11/18/2015, 6:49 PM

## 2015-11-18 NOTE — Progress Notes (Signed)
PULMONARY / CRITICAL CARE MEDICINE   Name: Laura [name] MRN: [id number] DOB: 11/01/1929    ADMISSION DATE:  11/09/2015 LOS 7 days  REFERRING MD:  [facility]  CHIEF COMPLAINT:  SDH  BRIEF 82 yo admitted 8/4 with SDH from fall. NS followed without surgical intervention needed. Transferred to floor and to [facility] service 8/8. During the night of 8/10 she became somnolent and CT scan revealed increased left SDH and she required intubation for airway protection. Family to decide how aggressive her care is to be. NS to evaluate.    CULTURES: Blood 11/18/2015 Tracheal aspirate 11/18/2015    ANTIBIOTICS:     SIGNIFICANT EVENTS: 8/10 CT head with expanded L sdh 8/10 intubated    11/17/15 - RN unable to get OG/NG. D/w Dr Saintclair Halsted - poor prognosis for surgery and not c/w patient goals per his family conversation. Currently on prn sedation and following comments. Mild restlessnes + . Dr Saintclair Halsted to meet wit family again  11/18/15 - was not ready for extubation yesterday due to somnolence. Goal is for medical extubation next few to several days. Dr Dayton Scrape EF is actually at baseline ahd she has diast chf.Rec diureses but creat rising and she is down 4#. Overnight agitated and febrile. Unble to get feeding tube duet to stricture - IR awaited. Family describes ? Zenkers diverticulum opd procedure several years ago but in EMR UGI endoscopy march 2016 showed non-obstructing Schatzki ring   VITAL SIGNS: BP 139/72   Pulse 93   Temp 98.8 F (37.1 C) (Oral)   Resp 18   Ht 5\' 3"  (1.6 m)   Wt 88.5 kg (195 lb 1.7 oz)   SpO2 95%   BMI 34.56 kg/m   HEMODYNAMICS:    VENTILATOR SETTINGS: Vent Mode: PRVC FiO2 (%):  [30 %-60 %] 30 % Set Rate:  [18 bmp] 18 bmp Vt Set:  [420 mL] 420 mL PEEP:  [5 cmH20] 5 cmH20 Plateau Pressure:  [15 cmH20-18 cmH20] 15 cmH20  INTAKE / OUTPUT: I/O last 3 completed shifts: In: 977.5 [P.O.:50; I.V.:212.5; IV Piggyback:715] Out: [id number]  PHYSICAL  EXAMINATION: General:  Obese WF on vent Neuro:  On prn sedation - .Moves all 4s but intermittently agitated HEENT:  ETT-> vent Cardiovascular: HSR RRR Lungs:  Coarse  And sync with vent Abdomen:  *OBESE + BS Musculoskeletal:  Itact Skin:  Cool and dry, vascular changes lower ext LABS:   PULMONARY  Recent Labs Lab 11/13/2015 2332 11/17/15 0558 11/17/15 0900  PHART  --  7.249* 7.486*  PCO2ART  --  101* 55.8*  PO2ART  --  55.3* 134.0*  HCO3 39.3* 42.7* 42.5*  TCO2 42 45.8 44  O2SAT 86.0 83.2 99.0    CBC  Recent Labs Lab 11/16/15 0652 11/17/15 0300 11/18/15 0249  HGB 13.1 13.5 12.9  HCT 43.7 46.0 41.6  WBC 7.1 7.9 9.8  PLT 125* 120* 125*    COAGULATION  Recent Labs Lab 11/22/2015 2320  INR 0.98    CARDIAC    Recent Labs Lab 11/14/15 1829 11/14/15 2340 11/15/15 0544  TROPONINI 0.09* 0.09* 0.09*   No results for input(s): PROBNP in the last 168 hours.   CHEMISTRY  Recent Labs Lab 11/12/15 0351 11/14/15 1829 11/15/15 0544 11/16/15 0652 11/16/15 0804 11/17/15 0300 11/18/15 0249  NA 139 140 142 141  --  140 141  K 4.5 4.2 4.4 4.0  --  3.8 3.6  CL 96* 98* 100* 99*  --  94* 91*  CO2 26  34* 36* 34*  --  39* 36*  GLUCOSE 127* 244* 101* 86  --  134* 168*  BUN 18 33* 36* 32*  --  26* 31*  CREATININE 0.59 1.03* 0.76 0.76  --  0.86 1.20*  CALCIUM 9.1 9.5 9.2 8.8*  --  8.8* 9.0  MG 1.9 2.2  --   --  1.9 2.0  --   PHOS 3.4  --   --   --  2.7  --   --    Estimated Creatinine Clearance: 37.5 mL/min (by C-G formula based on SCr of 1.2 mg/dL).   LIVER  Recent Labs Lab 12/05/2015 2320  INR 0.98     INFECTIOUS No results for input(s): LATICACIDVEN, PROCALCITON in the last 168 hours.   ENDOCRINE CBG (last 3)   Recent Labs  11/17/15 2351 11/18/15 0348 11/18/15 0750  GLUCAP 171* 157* 163*         IMAGING x48h  - image(s) personally visualized  -   highlighted in bold Ct Head Wo Contrast  Result Date: 11/17/2015 CLINICAL DATA:   Follow-up examination for subdural hematoma. EXAM: CT HEAD WITHOUT CONTRAST TECHNIQUE: Contiguous axial images were obtained from the base of the skull through the vertex without intravenous contrast. COMPARISON:  Prior CT from 11/13/2015. FINDINGS: Previously identified holo hemispheric mixed density left subdural hematoma again seen. This is relatively slightly increased in size measuring up to 12 mm in maximal thickness. Slightly decreased hyperdense blood products within this collection. Trace parafalcine hemorrhage has largely resolved. Trace tentorial hemorrhage again noted. Persistent mass effect on the left cerebral hemisphere with partial effacement of the left lateral ventricle. Left-to-right shift measures 12 mm. No frank ventricular entrapment. Similar dilatation of the right lateral ventricle. Effacement of the basilar cisterns is slightly worsened. No acute abnormality about the globes and orbits. Paranasal sinuses remain clear.  No mastoid effusion. Calvarium intact. Evolving left frontal scalp contusion noted.  No calvarial fracture. No acute parenchymal hemorrhage.  No acute infarct. IMPRESSION: Slightly increased size of mixed density left subdural hematoma without evidence of interval re-bleeding. Slightly worsened left-to-right shift now measuring up to 12 mm. Basilar cistern crowding has also slightly progressed as compared to previous. Electronically Signed   By: Jeannine Boga M.D.   On: 11/17/2015 03:11   Dg Chest Port 1 View  Result Date: 11/18/2015 CLINICAL DATA:  Intubation. EXAM: PORTABLE CHEST 1 VIEW COMPARISON:  11/17/2015. FINDINGS: Endotracheal tube in stable position. Stable cardiomegaly. Persistent bibasilar atelectasis and/or infiltrates. Persistent small left pleural effusion. No pneumothorax. IMPRESSION: 1. Endotracheal tube in stable position. 2. Persistent bibasilar atelectasis and/or infiltrates. Persistent small left pleural effusion. 3. Persistent cardiomegaly.  No  pulmonary venous congestion . Electronically Signed   By: Marcello Moores  Register   On: 11/18/2015 06:55   Dg Chest Port 1 View  Result Date: 11/17/2015 CLINICAL DATA:  Respiratory failure EXAM: PORTABLE CHEST 1 VIEW COMPARISON:  Yesterday FINDINGS: Severe cardiomegaly. Endotracheal tube placed. Tip is 1.7 cm from the carina. Thorax is rotated to the right. Bibasilar opacity is unchanged. Upper lung zones are grossly clear. IMPRESSION: Endotracheal tube placed.  Otherwise stable. Electronically Signed   By: Marybelle Killings M.D.   On: 11/17/2015 08:04   Dg Chest Port 1 View  Result Date: 11/16/2015 CLINICAL DATA:  Shortness of breath since noon today. History of MI, CHF, COPD. EXAM: PORTABLE CHEST 1 VIEW COMPARISON:  08/10/2013 FINDINGS: Heart is enlarged. There are bilateral pleural effusions. Increased bibasilar opacities now obscure the hemidiaphragms bilaterally,  left greater than right. There is mild pulmonary vascular congestion but no significant pulmonary edema. IMPRESSION: 1. Stable cardiomegaly. 2. Increased bilateral opacities consistent with infectious infiltrates or atelectasis and pleural effusions. Electronically Signed   By: Nolon Nations M.D.   On: 11/16/2015 16:59     DISCUSSION: 82 yo admitted 8/4 with SDH from fall. NS followed without surgical intervention needed. Transferred to floor and to [facility] service 8/8. During the night of 8/10 she became somnolent and CT scan revealed increased left SDH and she required intubation for airway protection. Family to decide how aggressive her care is to be. NS to evaluate.  ASSESSMENT / PLAN:  PULMONARY A: VDRF secondary to expanding SDH with inability to protect airway OSA COPD - severe gold stage 10 Aug 2015 pft + baseline hypercarbia + sensitivity to o2  - agitation precluding extubation  P:   Vent bundle BD's + duoneb Will need bipap if extubated - Dr Saintclair Halsted to complete discussions wit family and then decide on medical v terminal  extubation dependiong on course   CARDIOVASCULAR A:  CAD Recent SVT - hypertensive with acute on chronic diast chf (echo this admit with ef 45% but Dr Wynonia Lawman feels is under-read) - PACss and she is at risk for a Fib  - diuresed 4#  P:  Tele  Lopressors prn IV (dc po) Reduce lasix to daily 40mg   (tkae it at home every other day()  RENAL Lab Results  Component Value Date   CREATININE 1.20 (H) 11/18/2015   CREATININE 0.86 11/17/2015   CREATININE 0.76 11/16/2015   CREATININE 0.78 05/05/2013    A:  Mild AKI P:   Reduce lasix  Follow creatine  GASTROINTESTINAL A:   GI protection   - Unable to gt NG/OG 11/17/2015. Hx of schatzki ring June 2016 endoscopy partial non-obstruction (dr Zenovia Jarred)  P:   IR consult - d/w Gerre Couch - IR tro try first ; if they fail recall GI H2 blocker NPO  HEMATOLOGIC A:   Fall with SDH P:  Follow coags Monitor  INFECTIOUS A:   Febrile 11/18/15  P:   Culture  Start cefpemine 11/18/15  ENDOCRINE   A:   No acute issue P:   Follow glucose  NEUROLOGIC A:   SDH secondary to fall  11/17/15 - per Dr Saintclair Halsted only mild woprsening of SDH. Surgery not part of patient goals and patient is high risk candidate  -agitated 11/18/15  P:   Start precedex RASS goal: 0 Minimal sedation insetting of expanding SDH - do prn sedation Zoloft home dose to continue now for baseline depression DC decadron; not indicated in SDH and might be causing agitation Dc Keppra (new) since 11/15/15 ; might be causing agitation   FAMILY  -   - Inter-disciplinary family meet or Palliative Care meeting due by:  8/17 - dr Saintclair Halsted has initiated this process 11/17/2015 and dR Rony Ratz did 11/17/15 - DNR if arrests. Full medical care. Reassess goals daily     The patient is critically ill with multiple organ systems failure and requires high complexity decision making for assessment and support, frequent evaluation and titration of therapies, application of  advanced monitoring technologies and extensive interpretation of multiple databases.   Critical Care Time devoted to patient care services described in this note is  45  Minutes. This time reflects time of care of this signee Dr Brand Males. This critical care time does not reflect procedure time, or teaching time or supervisory time of  PA/NP/Med student/Med Resident etc but could involve care discussion time    Dr. Brand Males, M.D., [facility].C.P Pulmonary and Critical Care Medicine Staff Physician [place] [facility] Pager: [phone], If no answer or between  15:00h - 7:00h: call [phone]  11/18/2015 8:51 AM

## 2015-11-18 NOTE — Care Management Important Message (Signed)
Important Message  Patient Details  Name: Laura [name] MRN: [id number] Date of Birth: [birth year]   Medicare Important Message Given:  Yes    Loann Quill 11/18/2015, 1:23 PM

## 2015-11-19 ENCOUNTER — Inpatient Hospital Stay (HOSPITAL_COMMUNITY): Payer: Medicare Other

## 2015-11-19 DIAGNOSIS — I5033 Acute on chronic diastolic (congestive) heart failure: Secondary | ICD-10-CM

## 2015-11-19 DIAGNOSIS — J9620 Acute and chronic respiratory failure, unspecified whether with hypoxia or hypercapnia: Secondary | ICD-10-CM

## 2015-11-19 LAB — CBC WITH DIFFERENTIAL/PLATELET
Basophils Absolute: 0 10*3/uL (ref 0.0–0.1)
Basophils Relative: 0 %
EOS ABS: 0 10*3/uL (ref 0.0–0.7)
Eosinophils Relative: 0 %
HEMATOCRIT: 44.7 % (ref 36.0–46.0)
HEMOGLOBIN: 14.1 g/dL (ref 12.0–15.0)
LYMPHS ABS: 0.5 10*3/uL — AB (ref 0.7–4.0)
Lymphocytes Relative: 5 %
MCH: 29.6 pg (ref 26.0–34.0)
MCHC: 31.5 g/dL (ref 30.0–36.0)
MCV: 93.7 fL (ref 78.0–100.0)
MONOS PCT: 8 %
Monocytes Absolute: 0.7 10*3/uL (ref 0.1–1.0)
NEUTROS ABS: 8 10*3/uL — AB (ref 1.7–7.7)
NEUTROS PCT: 87 %
Platelets: 100 10*3/uL — ABNORMAL LOW (ref 150–400)
RBC: 4.77 MIL/uL (ref 3.87–5.11)
RDW: 14.2 % (ref 11.5–15.5)
WBC: 9.1 10*3/uL (ref 4.0–10.5)

## 2015-11-19 LAB — BLOOD GAS, ARTERIAL
ACID-BASE EXCESS: 11 mmol/L — AB (ref 0.0–2.0)
Bicarbonate: 34.6 mEq/L — ABNORMAL HIGH (ref 20.0–24.0)
DRAWN BY: 365271
FIO2: 30
MECHVT: 420 mL
O2 Saturation: 93.4 %
PEEP/CPAP: 5 cmH2O
Patient temperature: 98.6
RATE: 18 resp/min
TCO2: 35.8 mmol/L (ref 0–100)
pCO2 arterial: 40.6 mmHg (ref 35.0–45.0)
pH, Arterial: 7.54 — ABNORMAL HIGH (ref 7.350–7.450)
pO2, Arterial: 69.4 mmHg — ABNORMAL LOW (ref 80.0–100.0)

## 2015-11-19 LAB — GLUCOSE, CAPILLARY
GLUCOSE-CAPILLARY: 147 mg/dL — AB (ref 65–99)
GLUCOSE-CAPILLARY: 152 mg/dL — AB (ref 65–99)
Glucose-Capillary: 136 mg/dL — ABNORMAL HIGH (ref 65–99)
Glucose-Capillary: 92 mg/dL (ref 65–99)
Glucose-Capillary: 81 mg/dL (ref 65–99)

## 2015-11-19 LAB — PROCALCITONIN

## 2015-11-19 LAB — BASIC METABOLIC PANEL
ANION GAP: 11 (ref 5–15)
Anion gap: 11 (ref 5–15)
BUN: 35 mg/dL — AB (ref 6–20)
BUN: 38 mg/dL — ABNORMAL HIGH (ref 6–20)
CALCIUM: 8.9 mg/dL (ref 8.9–10.3)
CHLORIDE: 93 mmol/L — AB (ref 101–111)
CO2: 36 mmol/L — AB (ref 22–32)
CO2: 33 mmol/L — AB (ref 22–32)
CREATININE: 0.83 mg/dL (ref 0.44–1.00)
Calcium: 9.2 mg/dL (ref 8.9–10.3)
Chloride: 98 mmol/L — ABNORMAL LOW (ref 101–111)
Creatinine, Ser: 0.96 mg/dL (ref 0.44–1.00)
GFR calc non Af Amer: 53 mL/min — ABNORMAL LOW (ref >60)
Glucose, Bld: 133 mg/dL — ABNORMAL HIGH (ref 65–99)
Glucose, Bld: 85 mg/dL (ref 65–99)
POTASSIUM: 2.8 mmol/L — AB (ref 3.5–5.1)
Potassium: 3.5 mmol/L (ref 3.5–5.1)
SODIUM: 140 mmol/L (ref 135–145)
SODIUM: 142 mmol/L (ref 135–145)

## 2015-11-19 LAB — PHOSPHORUS: Phosphorus: 3.8 mg/dL (ref 2.5–4.6)

## 2015-11-19 LAB — MAGNESIUM: Magnesium: 1.9 mg/dL (ref 1.7–2.4)

## 2015-11-19 MED ORDER — POTASSIUM CHLORIDE 20 MEQ/15ML (10%) PO SOLN
40.0000 meq | ORAL | Status: AC
  Administered 2015-11-19 (×2): 40 meq
  Filled 2015-11-19 (×2): qty 30

## 2015-11-19 MED ORDER — FENTANYL CITRATE (PF) 2500 MCG/50ML IJ SOLN
10.0000 ug/h | INTRAMUSCULAR | Status: DC
  Administered 2015-11-19: 50 ug/h via INTRAVENOUS
  Filled 2015-11-19: qty 50

## 2015-11-19 NOTE — Progress Notes (Signed)
Got update from APP Mr Minior  -patient meets medicla extubaion criteria and extubated  - advised post extubation bipap  plan  continue ful medical care but if fails resp - then comfort.  - bipap ok  - no cpr - no cv  Dr. Brand Males, M.D., [facility].C.P Pulmonary and Critical Care Medicine Staff Physician [place] [facility] Pager: [phone], If no answer or between  15:00h - 7:00h: call [phone]  11/19/2015 2:51 PM

## 2015-11-19 NOTE — Procedures (Signed)
Extubation Procedure Note  Patient Details:   Name: Laura [name] DOB: [birth year] MRN: [id number]   Airway Documentation:     Evaluation  O2 sats: stable throughout Complications: No apparent complications Patient did tolerate procedure well. Bilateral Breath Sounds: Clear, Diminished   Yes  Placed to 4l/min [place] and weaned to 2l/min [place] Will place on Bipap this evening for rest.  Revonda Standard 11/19/2015, 6:31 PM

## 2015-11-19 NOTE — Progress Notes (Signed)
129mL fentanyl gtt and 54mL precedex wasted in the sink. Ardeth Sportsman, RN witnessed.

## 2015-11-19 NOTE — Progress Notes (Signed)
RT note-Patient was weaned on 5/5 and passed SBT, patients is still sleepy, sedation being decreased by RN, PS increased until awake. Continue to monitor.

## 2015-11-19 NOTE — Progress Notes (Addendum)
Subjective:    No events overnight  Objective:   Temp:  [97.4 F (36.3 C)-99.2 F (37.3 C)] 97.7 F (36.5 C) (08/12 0803) Pulse Rate:  [29-120] 71 (08/12 1000) Resp:  [12-29] 13 (08/12 1000) BP: (98-183)/(53-100) 102/62 (08/12 1000) SpO2:  [91 %-100 %] 95 % (08/12 1000) FiO2 (%):  [30 %] 30 % (08/12 1000) Weight:  [189 lb 6 oz (85.9 kg)] 189 lb 6 oz (85.9 kg) (08/12 0440) Last BM Date: 11/16/15  Filed Weights   11/17/15 0740 11/18/15 0200 11/19/15 0440  Weight: 199 lb 15.3 oz (90.7 kg) 195 lb 1.7 oz (88.5 kg) 189 lb 6 oz (85.9 kg)    Intake/Output Summary (Last 24 hours) at 11/19/15 1045 Last data filed at 11/19/15 1005  Gross per 24 hour  Intake          1204.54 ml  Output             2050 ml  Net          -845.46 ml    Telemetry: SR, PACs  Exam:  General: NAD  HEENT: sclera clear, throat clear  Resp: CTAB  Cardiac: RRR,  No m/r/g, no jvd  GI: abdomen soft, Nt, ND  MSK: no LE edema  Neuro: sedated, intubated  Psych: sedated, intubated  Lab Results:  Basic Metabolic Panel:  Recent Labs Lab 11/14/15 1829  11/16/15 0804 11/17/15 0300 11/18/15 0249 11/19/15 0445  NA 140  < >  --  140 141 140  K 4.2  < >  --  3.8 3.6 2.8*  CL 98*  < >  --  94* 91* 93*  CO2 34*  < >  --  39* 36* 36*  GLUCOSE 244*  < >  --  134* 168* 133*  BUN 33*  < >  --  26* 31* 35*  CREATININE 1.03*  < >  --  0.86 1.20* 0.96  CALCIUM 9.5  < >  --  8.8* 9.0 9.2  MG 2.2  --  1.9 2.0  --   --   < > = values in this interval not displayed.  Liver Function Tests: No results for input(s): AST, ALT, ALKPHOS, BILITOT, PROT, ALBUMIN in the last 168 hours.  CBC:  Recent Labs Lab 11/17/15 0300 11/18/15 0249 11/19/15 0445  WBC 7.9 9.8 9.1  HGB 13.5 12.9 14.1  HCT 46.0 41.6 44.7  MCV 100.9* 95.4 93.7  PLT 120* 125* 100*    Cardiac Enzymes:  Recent Labs Lab 11/14/15 1829 11/14/15 2340 11/15/15 0544  TROPONINI 0.09* 0.09* 0.09*    BNP: No results for input(s):  PROBNP in the last 8760 hours.  Coagulation: No results for input(s): INR in the last 168 hours.  ECG:   Medications:   Scheduled Medications: . antiseptic oral rinse  7 mL Mouth Rinse QID  . budesonide (PULMICORT) nebulizer solution  0.25 mg Nebulization BID  . ceFEPime (MAXIPIME) IV  2 g Intravenous Q24H  . chlorhexidine gluconate (SAGE KIT)  15 mL Mouth Rinse BID  . famotidine (PEPCID) IV  20 mg Intravenous Q24H  . furosemide  40 mg Intravenous Daily  . ipratropium-albuterol  3 mL Nebulization Q6H  . sertraline  50 mg Oral Q breakfast  . sodium chloride flush  10-40 mL Intracatheter Q12H  . sodium chloride flush  10-40 mL Intracatheter Q12H  . Vitamin D (Ergocalciferol)  50,000 Units Oral Q Wed     Infusions: . dexmedetomidine 1.3 mcg/kg/hr (11/19/15  1034)  . fentaNYL infusion INTRAVENOUS Stopped (11/19/15 1005)     PRN Medications:  sodium chloride, acetaminophen, albuterol, fentaNYL (SUBLIMAZE) injection, fentaNYL (SUBLIMAZE) injection, hydrALAZINE, metoprolol, sodium chloride flush, sodium chloride flush     Assessment/Plan    1. Acute on chronic diastolic HF - 4/0/99 echo LVEF 40-45%,  Inferolateral hypokinesis, diastolic function not described - LVEF decreased from 09/2015, potentialyl stress induced. From previous rounding note some though that echo may actually show low normal function.  - negative 1.9 liters yesterday, negative 4.2 liters since admission. She is on lasix 19m daily, decreased from 674mtid today.  - overall stable renal function,. Agree with decreasing IV lasix today.   2. Respiratory failure - vent management per critical care  3. Subdural hematoma - per primary team       JoCarlyle DollyM.D.

## 2015-11-19 NOTE — Progress Notes (Signed)
[facility] Progress Note Patient Name: Laura [name] DOB: [birth year] MRN: [id number]   Date of Service  11/19/2015  HPI/Events of Note  metab alkalosis  eICU Interventions  Lower RR 12     Intervention Category Major Interventions: Acid-Base disturbance - evaluation and management  ALVA,RAKESH V. 11/19/2015, 4:28 AM

## 2015-11-19 NOTE — Progress Notes (Signed)
[facility] ICU Electrolyte Replacement Protocol  Patient Name: Laura [name] DOB: 05-03-1929 MRN: [id number]  Date of Service  11/19/2015   HPI/Events of Note    Recent Labs Lab 11/14/15 1829 11/15/15 0544 11/16/15 0652 11/16/15 0804 11/17/15 0300 11/18/15 0249 11/19/15 0445  NA 140 142 141  --  140 141 140  K 4.2 4.4 4.0  --  3.8 3.6 2.8*  CL 98* 100* 99*  --  94* 91* 93*  CO2 34* 36* 34*  --  39* 36* 36*  GLUCOSE 244* 101* 86  --  134* 168* 133*  BUN 33* 36* 32*  --  26* 31* 35*  CREATININE 1.03* 0.76 0.76  --  0.86 1.20* 0.96  CALCIUM 9.5 9.2 8.8*  --  8.8* 9.0 9.2  MG 2.2  --   --  1.9 2.0  --   --   PHOS  --   --   --  2.7  --   --   --     Estimated Creatinine Clearance: 46.1 mL/min (by C-G formula based on SCr of 0.96 mg/dL).  Intake/Output      08/11 0701 - 08/12 0700   I.V. (mL/kg) 732.7 (8.5)   NG/GT 30   IV Piggyback 100   Total Intake(mL/kg) 862.7 (10)   Urine (mL/kg/hr) 2975 (1.4)   Total Output 2975   Net -2112.3        - I/O DETAILED x24h    Total I/O In: 413.3 [I.V.:383.3; NG/GT:30] Out: 675 [Urine:675] - I/O THIS SHIFT    ASSESSMENT   eICURN Interventions  K+ 2.8 Replaced using ICU protocol   ASSESSMENT: [name], Rada Zegers Nicole 11/19/2015, 5:46 AM

## 2015-11-19 NOTE — Progress Notes (Signed)
PULMONARY / CRITICAL CARE MEDICINE   Name: Laura [name] MRN: [id number] DOB: [birth year]    ADMISSION DATE:  11/28/2015 LOS 8 days  REFERRING MD:  [facility]  CHIEF COMPLAINT:  SDH  BRIEF 82 yo admitted 8/4 with SDH from fall. NS followed without surgical intervention needed. Transferred to floor and to [facility] service 8/8. During the night of 8/10 she became somnolent and CT scan revealed increased left SDH and she required intubation for airway protection. Family to decide how aggressive her care is to be. NS to evaluate.    CULTURES: Blood 11/18/2015 Tracheal aspirate 11/18/2015 Results for orders placed or performed during the hospital encounter of 11/19/2015  MRSA PCR Screening     Status: None   Collection Time: 11/12/15  1:24 AM  Result Value Ref Range Status   MRSA by PCR NEGATIVE NEGATIVE Final    Comment:        The GeneXpert MRSA Assay (FDA approved for NASAL specimens only), is one component of a comprehensive MRSA colonization surveillance program. It is not intended to diagnose MRSA infection nor to guide or monitor treatment for MRSA infections.   Culture, blood (Routine X 2) w Reflex to ID Panel     Status: None (Preliminary result)   Collection Time: 11/18/15 10:26 AM  Result Value Ref Range Status   Specimen Description BLOOD LEFT WRIST  Final   Special Requests IN PEDIATRIC BOTTLE 1CC  Final   Culture NO GROWTH < 12 HOURS  Final   Report Status PENDING  Incomplete  Culture, blood (Routine X 2) w Reflex to ID Panel     Status: None (Preliminary result)   Collection Time: 11/18/15 10:34 AM  Result Value Ref Range Status   Specimen Description BLOOD LEFT HAND  Final   Special Requests IN PEDIATRIC BOTTLE 1CC  Final   Culture NO GROWTH < 12 HOURS  Final   Report Status PENDING  Incomplete  Culture, respiratory (NON-Expectorated)     Status: None (Preliminary result)   Collection Time: 11/18/15 11:37 AM  Result Value Ref Range Status   Specimen Description  TRACHEAL ASPIRATE  Final   Special Requests NONE  Final   Gram Stain   Final    FEW WBC PRESENT, PREDOMINANTLY PMN RARE GRAM POSITIVE COCCI IN PAIRS RARE GRAM NEGATIVE COCCOBACILLI    Culture PENDING  Incomplete   Report Status PENDING  Incomplete       ANTIBIOTICS:  Anti-infectives    Start     Dose/Rate Route Frequency Ordered Stop   11/18/15 1200  piperacillin-tazobactam (ZOSYN) IVPB 3.375 g  Status:  Discontinued     3.375 g 12.5 mL/hr over 240 Minutes Intravenous Every 8 hours 11/18/15 0952 11/18/15 1002   11/18/15 1200  ceFEPIme (MAXIPIME) 2 g in dextrose 5 % 50 mL IVPB     2 g 100 mL/hr over 30 Minutes Intravenous Every 24 hours 11/18/15 1002           SIGNIFICANT EVENTS: 8/10 CT head with expanded L sdh 8/10 intubated    11/17/15 - RN unable to get OG/NG. D/w Dr Saintclair Halsted - poor prognosis for surgery and not c/w patient goals per his family conversation. Currently on prn sedation and following comments. Mild restlessnes + . Dr Saintclair Halsted to meet wit family again  11/18/15 - was not ready for extubation yesterday due to somnolence. Goal is for medical extubation next few to several days. Dr Dayton Scrape EF is actually at baseline ahd she has diast  chf.Rec diureses but creat rising and she is down 4#. Overnight agitated and febrile. Unble to get feeding tube duet to stricture - IR awaited. Family describes ? Zenkers diverticulum opd procedure several years ago but in EMR UGI endoscopy march 2016 showed non-obstructing Schatzki ring   SUBJECTIVE/OVERNIGHT/INTERVAL HX 11/19/15 - doing sbt. On precedex. Sleepy but easily arousable and can get agitated; communicated to RN that she wants extubation - RN felt patient had capacity. Off fentanyl gtt. Fever reduced with maxipime. Family at bedside: prfer extubation 11/19/2015    VITAL SIGNS: BP 117/73 (BP Location: Right Arm)   Pulse 71   Temp 97.7 F (36.5 C) (Oral)   Resp 15   Ht 5\' 3"  (1.6 m)   Wt 85.9 kg (189 lb 6 oz)   SpO2 96%    BMI 33.55 kg/m   HEMODYNAMICS:    VENTILATOR SETTINGS: Vent Mode: PSV;CPAP FiO2 (%):  [30 %] 30 % Set Rate:  [12 bmp-18 bmp] 12 bmp Vt Set:  [420 mL] 420 mL PEEP:  [5 cmH20] 5 cmH20 Pressure Support:  [10 cmH20] 10 cmH20 Plateau Pressure:  [13 cmH20-22 cmH20] 16 cmH20  INTAKE / OUTPUT: I/O last 3 completed shifts: In: 1347.3 [I.V.:999.8; NG/GT:90; IV Piggyback:257.5] Out: 4490 [Urine:4490]  PHYSICAL EXAMINATION: General:  Obese WF on vent Neuro:  On precedex - .RASS -2 at this point (did try to write to family ; indicated she wanted extubation) HEENT:  ETT-> vent Cardiovascular: HSR RRR Lungs:  Coarse  And sync with vent Abdomen:  *OBESE + BS Musculoskeletal:  Itact Skin:  Cool and dry, vascular changes lower ext LABS:   PULMONARY  Recent Labs Lab 11/17/15 0558 11/17/15 0900 11/19/15 0335  PHART 7.249* 7.486* 7.540*  PCO2ART 101* 55.8* 40.6  PO2ART 55.3* 134.0* 69.4*  HCO3 42.7* 42.5* 34.6*  TCO2 45.8 44 35.8  O2SAT 83.2 99.0 93.4    CBC  Recent Labs Lab 11/17/15 0300 11/18/15 0249 11/19/15 0445  HGB 13.5 12.9 14.1  HCT 46.0 41.6 44.7  WBC 7.9 9.8 9.1  PLT 120* 125* 100*    COAGULATION No results for input(s): INR in the last 168 hours.  CARDIAC    Recent Labs Lab 11/14/15 1829 11/14/15 2340 11/15/15 0544  TROPONINI 0.09* 0.09* 0.09*   No results for input(s): PROBNP in the last 168 hours.   CHEMISTRY  Recent Labs Lab 11/14/15 1829 11/15/15 0544 11/16/15 [id number] 11/16/15 0804 11/17/15 0300 11/18/15 0249 11/19/15 0445  NA 140 142 141  --  140 141 140  K 4.2 4.4 4.0  --  3.8 3.6 2.8*  CL 98* 100* 99*  --  94* 91* 93*  CO2 34* 36* 34*  --  39* 36* 36*  GLUCOSE 244* 101* 86  --  134* 168* 133*  BUN 33* 36* 32*  --  26* 31* 35*  CREATININE 1.03* 0.76 0.76  --  0.86 1.20* 0.96  CALCIUM 9.5 9.2 8.8*  --  8.8* 9.0 9.2  MG 2.2  --   --  1.9 2.0  --   --   PHOS  --   --   --  2.7  --   --   --    Estimated Creatinine Clearance:  46.1 mL/min (by C-G formula based on SCr of 0.96 mg/dL).   LIVER No results for input(s): AST, ALT, ALKPHOS, BILITOT, PROT, ALBUMIN, INR in the last 168 hours.   INFECTIOUS  Recent Labs Lab 11/18/15 1026 11/19/15 0445  LATICACIDVEN 1.5  --  PROCALCITON <0.10 <0.10     ENDOCRINE CBG (last 3)   Recent Labs  11/18/15 2355 11/19/15 0401 11/19/15 0805  GLUCAP 146* 147* 152*         IMAGING x48h  - image(s) personally visualized  -   highlighted in bold Dg Abd 1 View  Result Date: 11/18/2015 CLINICAL DATA:  NG tube placement, 20 cc Gastrografin injected EXAM: ABDOMEN - 1 VIEW COMPARISON:  08/01/2013 FINDINGS: There is NG feeding tube with tip in proximal small bowel at the level of ligament of Treitz. Contrast material noted within distal duodenum and jejunum. No evidence of contrast extravasation. IMPRESSION: NG feeding tube in place.  No evidence of contrast extravasation. Electronically Signed   By: Lahoma Crocker M.D.   On: 11/18/2015 13:33   Dg Chest Port 1 View  Result Date: 11/19/2015 CLINICAL DATA:  Endotracheal tube EXAM: PORTABLE CHEST 1 VIEW COMPARISON:  11/18/2015 FINDINGS: Endotracheal tube 2 cm above the carina unchanged. Central venous catheter tip in the SVC unchanged. No pneumothorax Bibasilar atelectasis and small effusions unchanged.  No edema. IMPRESSION: Bibasilar atelectasis and  small pleural effusions unchanged. Endotracheal tube 2 cm above the carina. Electronically Signed   By: Franchot Gallo M.D.   On: 11/19/2015 07:23   Dg Chest Port 1 View  Result Date: 11/18/2015 CLINICAL DATA:  Line placement EXAM: PORTABLE CHEST 1 VIEW COMPARISON:  Chest radiograph from earlier today. FINDINGS: Endotracheal tube tip is 1.8 cm above the carina. Enteric tube enters stomach with the tip not seen on this image. Left PICC terminates at the cavoatrial junction. Stable cardiomediastinal silhouette with mild cardiomegaly. No pneumothorax. Probable trace bilateral pleural  effusions. Hazy bibasilar lung opacities, unchanged. Borderline mild pulmonary edema, unchanged. IMPRESSION: 1. Well-positioned left PICC with the tip at the cavoatrial junction. 2. Endotracheal tube tip 1.8 cm above the carina, consider retracting 0.5-1 cm . 3. Stable cardiomegaly.  Stable borderline mild pulmonary edema. 4. Stable hazy bibasilar lung opacities, favor atelectasis. 5. Stable probable trace bilateral pleural effusions. Electronically Signed   By: Ilona Sorrel M.D.   On: 11/18/2015 17:44   Dg Chest Port 1 View  Result Date: 11/18/2015 CLINICAL DATA:  82 year old female with a history of postop line placement. EXAM: PORTABLE CHEST 1 VIEW COMPARISON:  11/18/2015 FINDINGS: Cardiomediastinal silhouette unchanged in size and contour. Endotracheal tube terminates approximately 2.2 cm above the carina. Enteric feeding tube has been placed projecting over the mediastinum and terminating out of the field of view. Veil like opacity bilateral lung bases with mixed interstitial and airspace opacities. No pneumothorax. In the interval, a left PICC has been placed. The catheter traverses the region the brachycephalic vein, with a kink in the catheter at the superior vena cava and the tip terminating in a superiorly directed orientation. IMPRESSION: Similar appearance of basilar lung opacity, potentially a combination of pleural effusion, atelectasis, and/ or consolidation. Interval placement of left PICC. The catheter is kinked within the superior vena cava, with the final 3-4 cm directed cranially, potentially terminating within the azygos vein. These results will be called to the ordering clinician or representative by the Radiologist Assistant, and communication documented in the PACS or zVision Dashboard. Interval placement of enteric feeding tube terminating out of the field of view. Endotracheal tube terminates approximately 2.2 cm above the carina, unchanged from prior. Signed, Dulcy Fanny. Earleen Newport, DO  [facility] Electronically Signed   By: Corrie Mckusick D.O.   On: 11/18/2015 16:00   Dg  Chest Port 1 View  Result Date: 11/18/2015 CLINICAL DATA:  Intubation. EXAM: PORTABLE CHEST 1 VIEW COMPARISON:  11/17/2015. FINDINGS: Endotracheal tube in stable position. Stable cardiomegaly. Persistent bibasilar atelectasis and/or infiltrates. Persistent small left pleural effusion. No pneumothorax. IMPRESSION: 1. Endotracheal tube in stable position. 2. Persistent bibasilar atelectasis and/or infiltrates. Persistent small left pleural effusion. 3. Persistent cardiomegaly.  No pulmonary venous congestion . Electronically Signed   By: Marcello Moores  Register   On: 11/18/2015 06:55   Dg Loyce Dys Tube Plc W/fl-no Rad  Result Date: 11/18/2015 CLINICAL DATA:  NASO G TUBE PLACEMENT WITH FLUORO Fluoroscopy was utilized by the requesting physician.  No radiographic interpretation.     DISCUSSION: 82 yo admitted 8/4 with SDH from fall. NS followed without surgical intervention needed. Transferred to floor and to [facility] service 8/8. During the night of 8/10 she became somnolent and CT scan revealed increased left SDH and she required intubation for airway protection. Family to decide how aggressive her care is to be. NS to evaluate.  ASSESSMENT / PLAN:  PULMONARY A: VDRF secondary to expanding SDH with inability to protect airway OSA COPD - severe gold stage 10 Aug 2015 pft + baseline hypercarbia + sensitivity to o2  - much improved. Doing sbt Family and RN indicating patient desires extubation ("never wanted it in first place"). . Mental status can be barrier for extubation faiure  P:   Vent bundle BD's + duoneb Will need bipap if extubated 11/19/2015 but goal is extubated 11/19/2015 -    CARDIOVASCULAR A:  CAD Recent SVT - hypertensive with acute on chronic diast chf (echo this admit with ef 45% but Dr Wynonia Lawman feels is under-read) - PACss and she is at  risk for a Fib  - diuresed 4#  P:  Tele  Lopressors prn IV (dc po) lasix to daily 40mg   (tkae it at home every other day()  RENAL Lab Results  Component Value Date   CREATININE 0.96 11/19/2015   CREATININE 1.20 (H) 11/18/2015   CREATININE 0.86 11/17/2015   CREATININE 0.78 05/05/2013    A:  Mild AKI - improved P:     Follow creatine  GASTROINTESTINAL A:   GI protection   - Unable to gt NG/OG 11/17/2015. Hx of schatzki ring June 2016 endoscopy partial non-obstruction (dr Zenovia Jarred). S/p feeding tube 11/18/15  P:   H2 blocker NPO; planned extubation 8.12  HEMATOLOGIC A:   Fall with SDH P:  Follow coags Monitor  INFECTIOUS A:   Febrile 11/18/15  - reslved fever after abx  P:   Culture  cefpemine 11/18/15  ENDOCRINE   A:   No acute issue P:   Follow glucose  NEUROLOGIC A:   SDH secondary to fall  11/17/15 - per Dr Saintclair Halsted only mild woprsening of SDH. Surgery not part of patient goals and patient is high risk candidate  -agitated 11/18/15 - 11/19/15 - improved . OFf fent gtt. Mental status can be barrier to failure oof extubation  P:    precedex RASS goal: 0 Minimal sedation insetting of expanding SDH - do prn sedation Zoloft home dose to continue now for baseline depression Off  decadron; not indicated in SDH and might be causing agitation Off Keppra (new) since 11/15/15 ; might be causing agitation   FAMILY  -   - Inter-disciplinary family meet or Palliative Care meeting due by:  8/17 - dr Saintclair Halsted has initiated this process 11/17/2015 and dR Jhoselin Crume did 11/17/15 - DNR if arrests. Full medical care.  Reassess goals daily  Interdisciplinary Goals of Care Family Meeting    Date carried out:: 11/19/2015  Location of the meeting: Bedside  And conference room Member's involved: Physician, Bedside Registered Nurse, Family Member or next of kin and Other: husband, 2 daughters  Durable Power of Forensic psychologist or Loss adjuster, chartered: husband but jt  decision with fail   Discussion: We discussed goals of care for [employer] .   - wean precedex and aim for extubation. Family feel they have fgiven enough short term trial and this is best she might be. Also in best interest and subs sjudegkent they feel she would not have wanted intubation. Will reassess in few hours and aim for extubation ti bipap +/- precedex. If fails move to full comfort  Code status: Limited Code or DNR with short term  Disposition: Continue current acute care - aim towards extubateion  Time spent for the meeting: 10 min  Treana Lacour 11/19/2015 11:36 AM       The patient is critically ill with multiple organ systems failure and requires high complexity decision making for assessment and support, frequent evaluation and titration of therapies, application of advanced monitoring technologies and extensive interpretation of multiple databases.   Critical Care Time devoted to patient care services described in this note is  45  Minutes. This time reflects time of care of this signee Dr Brand Males. This critical care time does not reflect procedure time, or teaching time or supervisory time of PA/NP/Med student/Med Resident etc but could involve care discussion time    Dr. Brand Males, M.D., [facility]C.P Pulmonary and Critical Care Medicine Staff Physician [facility] Pager: [phone], If no answer or between  15:00h - 7:00h: call [phone]  11/19/2015 11:15 AM

## 2015-11-19 NOTE — Progress Notes (Signed)
eLink Physician-Brief Progress Note Patient Name: Laura [name] DOB: [birth year] MRN: [id number]   Date of Service  11/19/2015  HPI/Events of Note  Agitated on max precedex  eICU Interventions  Add fent gtt     Intervention Category Major Interventions: Delirium, psychosis, severe agitation - evaluation and management  ALVA,RAKESH V. 11/19/2015, 12:19 AM

## 2015-11-19 NOTE — Progress Notes (Signed)
Laura [name] placed orders to extubated patient. 1430 Spouse and Daughters called twice per telephone numbers provided; no answer. 2nd floor waiting areas checked, no family members present. Per [name] okay to extubated patient. 1437 Patient extubated to 4L [place]. Pt a&o x4, no c/o pain, no signs of distress, respiratory and CCM [name] at bedside. Will continue to monitor patient as needed and contact family with updates.

## 2015-11-20 DIAGNOSIS — R0602 Shortness of breath: Secondary | ICD-10-CM

## 2015-11-20 LAB — BASIC METABOLIC PANEL
Anion gap: 16 — ABNORMAL HIGH (ref 5–15)
BUN: 39 mg/dL — ABNORMAL HIGH (ref 6–20)
CALCIUM: 9 mg/dL (ref 8.9–10.3)
CO2: 31 mmol/L (ref 22–32)
CREATININE: 0.94 mg/dL (ref 0.44–1.00)
Chloride: 95 mmol/L — ABNORMAL LOW (ref 101–111)
GFR, EST NON AFRICAN AMERICAN: 55 mL/min — AB (ref >60)
Glucose, Bld: 69 mg/dL (ref 65–99)
Potassium: 3.4 mmol/L — ABNORMAL LOW (ref 3.5–5.1)
SODIUM: 142 mmol/L (ref 135–145)

## 2015-11-20 LAB — CBC WITH DIFFERENTIAL/PLATELET
BASOS ABS: 0 10*3/uL (ref 0.0–0.1)
BASOS PCT: 0 %
EOS ABS: 0.3 10*3/uL (ref 0.0–0.7)
EOS PCT: 3 %
HEMATOCRIT: 45.4 % (ref 36.0–46.0)
Hemoglobin: 14.2 g/dL (ref 12.0–15.0)
Lymphocytes Relative: 7 %
Lymphs Abs: 0.8 10*3/uL (ref 0.7–4.0)
MCH: 30.5 pg (ref 26.0–34.0)
MCHC: 31.3 g/dL (ref 30.0–36.0)
MCV: 97.4 fL (ref 78.0–100.0)
MONO ABS: 0.9 10*3/uL (ref 0.1–1.0)
MONOS PCT: 8 %
Neutro Abs: 9.7 10*3/uL — ABNORMAL HIGH (ref 1.7–7.7)
Neutrophils Relative %: 83 %
PLATELETS: 112 10*3/uL — AB (ref 150–400)
RBC: 4.66 MIL/uL (ref 3.87–5.11)
RDW: 14.4 % (ref 11.5–15.5)
WBC: 11.7 10*3/uL — ABNORMAL HIGH (ref 4.0–10.5)

## 2015-11-20 LAB — BLOOD GAS, ARTERIAL
Acid-Base Excess: 8 mmol/L — ABNORMAL HIGH (ref 0.0–2.0)
BICARBONATE: 31.6 meq/L — AB (ref 20.0–24.0)
DRAWN BY: 213381
O2 Content: 2 L/min
O2 SAT: 95.4 %
PH ART: 7.501 — AB (ref 7.350–7.450)
PO2 ART: 76.3 mmHg — AB (ref 80.0–100.0)
Patient temperature: 98.6
TCO2: 32.8 mmol/L (ref 0–100)
pCO2 arterial: 40.7 mmHg (ref 35.0–45.0)

## 2015-11-20 LAB — GLUCOSE, CAPILLARY
GLUCOSE-CAPILLARY: 113 mg/dL — AB (ref 65–99)
GLUCOSE-CAPILLARY: 62 mg/dL — AB (ref 65–99)
GLUCOSE-CAPILLARY: 73 mg/dL (ref 65–99)
Glucose-Capillary: 70 mg/dL (ref 65–99)
Glucose-Capillary: 95 mg/dL (ref 65–99)
Glucose-Capillary: 102 mg/dL — ABNORMAL HIGH (ref 65–99)
Glucose-Capillary: 92 mg/dL (ref 65–99)

## 2015-11-20 LAB — CULTURE, RESPIRATORY: CULTURE: NORMAL

## 2015-11-20 LAB — CULTURE, RESPIRATORY W GRAM STAIN

## 2015-11-20 LAB — PROCALCITONIN: Procalcitonin: <0.1 ng/mL

## 2015-11-20 MED ORDER — CHLORHEXIDINE GLUCONATE 0.12 % MT SOLN
15.0000 mL | Freq: Two times a day (BID) | OROMUCOSAL | Status: DC
  Administered 2015-11-20 – 2015-11-22 (×6): 15 mL via OROMUCOSAL

## 2015-11-20 MED ORDER — FAMOTIDINE 20 MG PO TABS
20.0000 mg | ORAL_TABLET | Freq: Every day | ORAL | Status: DC
  Administered 2015-11-20: 20 mg via ORAL
  Filled 2015-11-20: qty 1

## 2015-11-20 MED ORDER — FUROSEMIDE 10 MG/ML IJ SOLN
20.0000 mg | Freq: Every day | INTRAMUSCULAR | Status: DC
  Administered 2015-11-20: 20 mg via INTRAVENOUS
  Filled 2015-11-20: qty 2

## 2015-11-20 MED ORDER — METOPROLOL TARTRATE 12.5 MG HALF TABLET
12.5000 mg | ORAL_TABLET | Freq: Two times a day (BID) | ORAL | Status: DC
  Administered 2015-11-20: 12.5 mg via ORAL
  Filled 2015-11-20 (×3): qty 1

## 2015-11-20 MED ORDER — CETYLPYRIDINIUM CHLORIDE 0.05 % MT LIQD
7.0000 mL | Freq: Two times a day (BID) | OROMUCOSAL | Status: DC
  Administered 2015-11-20 – 2015-11-22 (×6): 7 mL via OROMUCOSAL

## 2015-11-20 MED ORDER — POTASSIUM CHLORIDE 20 MEQ/15ML (10%) PO SOLN
20.0000 meq | Freq: Every day | ORAL | Status: DC
  Administered 2015-11-20: 20 meq via ORAL
  Filled 2015-11-20 (×2): qty 15

## 2015-11-20 MED ORDER — ACETAMINOPHEN 325 MG PO TABS
650.0000 mg | ORAL_TABLET | Freq: Four times a day (QID) | ORAL | Status: DC | PRN
  Administered 2015-11-20: 650 mg via ORAL
  Filled 2015-11-20: qty 2

## 2015-11-20 MED ORDER — POTASSIUM CHLORIDE 20 MEQ/15ML (10%) PO SOLN: 40.0000 meq | Freq: Two times a day (BID) | ORAL | Status: DC

## 2015-11-20 MED ORDER — ACETAMINOPHEN 325 MG PO TABS
650.0000 mg | ORAL_TABLET | ORAL | Status: DC | PRN
  Administered 2015-11-20: 650 mg via ORAL
  Filled 2015-11-20: qty 2

## 2015-11-20 NOTE — Progress Notes (Signed)
[facility] ADULT ICU REPLACEMENT PROTOCOL FOR AM LAB REPLACEMENT ONLY  The patient does not apply for the [facility] Adult ICU Electrolyte Replacment Protocol based on the criteria listed below:   1. Is GFR >/= 40 ml/min? Yes.    Patient's GFR today is >60 2. Is urine output >/= 0.5 ml/kg/hr for the last 6 hours? No. Patient's UOP is 0.3 ml/kg/hr 3. Is BUN < 60 mg/dL? Yes.    Patient's BUN today is 39 4. Abnormal electrolyte(s): K+3.4 5. Ordered repletion with: *NA 6. If a panic level lab has been reported, has the CCM MD in charge been notified? No..   Physician:  Thora Lance, Eddie Dibbles Hilliard 11/20/2015 6:01 AM

## 2015-11-20 NOTE — Progress Notes (Signed)
[facility] Progress Note Patient Name: GLORI GREELY DOB: 1930/03/16 MRN: [id number]   Date of Service  11/20/2015  HPI/Events of Note  Pt requesting tylenol for HA  eICU Interventions  done     Intervention Category Minor Interventions: Routine modifications to care plan (e.g. PRN medications for pain, fever)  Christinia Gully 11/20/2015, 9:52 PM

## 2015-11-20 NOTE — Progress Notes (Signed)
Subjective:    Extubated, feels well this morning.   Objective:   Temp:  [97.5 F (36.4 C)-98.9 F (37.2 C)] 98.1 F (36.7 C) (08/13 0801) Pulse Rate:  [46-122] 86 (08/13 0830) Resp:  [13-28] 25 (08/13 0830) BP: (82-170)/(59-105) 170/94 (08/13 0800) SpO2:  [92 %-100 %] 93 % (08/13 0830) FiO2 (%):  [30 %-40 %] 40 % (08/13 0000) Weight:  [187 lb 9.8 oz (85.1 kg)] 187 lb 9.8 oz (85.1 kg) (08/13 0424) Last BM Date: 11/16/15  Filed Weights   11/18/15 0200 11/19/15 0440 11/20/15 0424  Weight: 195 lb 1.7 oz (88.5 kg) 189 lb 6 oz (85.9 kg) 187 lb 9.8 oz (85.1 kg)    Intake/Output Summary (Last 24 hours) at 11/20/15 0928 Last data filed at 11/20/15 0800  Gross per 24 hour  Intake           427.81 ml  Output             1680 ml  Net         -1252.19 ml    Telemetry: SR, PACs  Exam:  General: NAD  HEENT: sclera clear, throat clear  Resp: CTAB  Cardiac: RRR,  No m/r/g, no jvd  GI: abdomen soft, Nt, ND  MSK: no LE edema  Neuro: no focal deficits  Psych: appropriate affect  Lab Results:  Basic Metabolic Panel:  Recent Labs Lab 11/16/15 0804 11/17/15 0300  11/19/15 0445 11/19/15 1937 11/20/15 0448  NA  --  140  < > 140 142 142  K  --  3.8  < > 2.8* 3.5 3.4*  CL  --  94*  < > 93* 98* 95*  CO2  --  39*  < > 36* 33* 31  GLUCOSE  --  134*  < > 133* 85 69  BUN  --  26*  < > 35* 38* 39*  CREATININE  --  0.86  < > 0.96 0.83 0.94  CALCIUM  --  8.8*  < > 9.2 8.9 9.0  MG 1.9 2.0  --  1.9  --   --   < > = values in this interval not displayed.  Liver Function Tests: No results for input(s): AST, ALT, ALKPHOS, BILITOT, PROT, ALBUMIN in the last 168 hours.  CBC:  Recent Labs Lab 11/18/15 0249 11/19/15 0445 11/20/15 0448  WBC 9.8 9.1 11.7*  HGB 12.9 14.1 14.2  HCT 41.6 44.7 45.4  MCV 95.4 93.7 97.4  PLT 125* 100* 112*    Cardiac Enzymes:  Recent Labs Lab 11/14/15 1829 11/14/15 2340 11/15/15 0544  TROPONINI 0.09* 0.09* 0.09*    BNP: No  results for input(s): PROBNP in the last 8760 hours.  Coagulation: No results for input(s): INR in the last 168 hours.  ECG:   Medications:   Scheduled Medications: . antiseptic oral rinse  7 mL Mouth Rinse q12n4p  . budesonide (PULMICORT) nebulizer solution  0.25 mg Nebulization BID  . ceFEPime (MAXIPIME) IV  2 g Intravenous Q24H  . chlorhexidine  15 mL Mouth Rinse BID  . famotidine  20 mg Oral QHS  . furosemide  20 mg Intravenous Daily  . ipratropium-albuterol  3 mL Nebulization Q6H  . potassium chloride  20 mEq Oral Daily  . sertraline  50 mg Oral Q breakfast  . sodium chloride flush  10-40 mL Intracatheter Q12H  . sodium chloride flush  10-40 mL Intracatheter Q12H  . Vitamin D (Ergocalciferol)  50,000 Units Oral Q Wed  Infusions:    PRN Medications: sodium chloride, albuterol, hydrALAZINE, metoprolol, sodium chloride flush, sodium chloride flush     Assessment/Plan    1. Acute on chronic diastolic HF - 0000000 echo LVEF 40-45%,  Inferolateral hypokinesis, diastolic function not described - LVEF decreased from 09/2015, potentially stress induced. From previous rounding note thought that echo may actually show low normal function.  - negative 1.1 liters yesterday, negative 5.3 liters since admission. She is on lasix 20mg  daily, decreased from 40mg  daily today.  - mild uptrend in renal function,. Agree with decreasing IV lasix today.   2. Respiratory failure - extubated since yesterdays rounds, doing well   3. Subdural hematoma - per primary team  4. PACs - bp's have improved, we will restart her home lopressor. No sustained arrhythmias, no significant tachycardia.  - start back slightly lower dose at 12.5mg  bid, can titrate up if needed.      Laura [name], M.D.

## 2015-11-20 NOTE — Progress Notes (Signed)
RT note patient placed back to Bipap, sleepy, post ABG, per MD.

## 2015-11-20 NOTE — Evaluation (Signed)
Physical Therapy Evaluation Patient Details Name: Laura [name] MRN: [id number] DOB: 03/19/1930 Today's Date: 11/20/2015   History of Present Illness  8 female admitted for neurologic changes after a mechanical fall on 8/1. Imaging revealed a L frontotemporal subdural hematoma with 1.3cm rightward shift. PMH is significant for CAD, HTN, HLD, COPD on 2.5L O2, and CHF. OF NOTE: patient also with recent fall on 7/15.  Had increased solemnance with CT positive for worsening L SDH, and was intubated on 11/17/15, was extubated 11/19/15.  Clinical Impression  Patient presents with decreased independence with mobility due to deficits listed in PT problem list.  She will benefit from skilled PT in the acute setting to allow return home with spouse assist following CIR level rehab stay. Main deficits are with balance, activity tolerance and safety awareness.      Follow Up Recommendations CIR;Supervision for mobility/OOB    Equipment Recommendations  Other (comment) (TBA)    Recommendations for Other Services Rehab consult     Precautions / Restrictions Precautions Precautions: Fall Precaution Comments: O2 @ 1LPM only      Mobility  Bed Mobility Overal bed mobility: Needs Assistance Bed Mobility: Supine to Sit     Supine to sit: HOB elevated;Mod assist;Max assist     General bed mobility comments: assist for legs off bed and to lift trunk upright; also for scooting to EOB  Transfers Overall transfer level: Needs assistance Equipment used: Rolling walker (2 wheeled) Transfers: Sit to/from Stand Sit to Stand: Mod assist;+2 physical assistance         General transfer comment: heavy lifting assist from EOB, assist and time for attaining upright and vertical posture, initially posterior bias with flexed posture; mod/max A for COG over BOS  Ambulation/Gait Ambulation/Gait assistance: Mod assist;+2 physical assistance Ambulation Distance (Feet): 3 Feet Assistive device: Rolling  walker (2 wheeled) Gait Pattern/deviations: Step-to pattern;Wide base of support;Decreased dorsiflexion - right;Decreased dorsiflexion - left;Decreased stride length;Leaning posteriorly;Shuffle     General Gait Details: difficulty progressing feet with posterior bias and poor lateral weight shifts, assist for weight shifts and pt able to move feet slowly with poor foot clearance to move toward and pivot to chair  Stairs            Wheelchair Mobility    Modified Rankin (Stroke Patients Only)       Balance Overall balance assessment: Needs assistance   Sitting balance-Leahy Scale: Poor Sitting balance - Comments: needed assist to get to sitting with feet supported, initially posterior Postural control: Posterior lean Standing balance support: Bilateral upper extremity supported Standing balance-Leahy Scale: Poor Standing balance comment: UE assist and mod +2 for balance; leans posterior and to L                             Pertinent Vitals/Pain Pain Assessment: No/denies pain    Home Living Family/patient expects to be discharged to:: Inpatient rehab Living Arrangements: Spouse/significant other Available Help at Discharge: Family;Available 24 hours/day Type of Home: Other(Comment) (townhome) Home Access: Stairs to enter Entrance Stairs-Rails: Can reach both Entrance Stairs-Number of Steps: 2 Home Layout: Two level;Able to live on main level with bedroom/bathroom Home Equipment: Gilford Rile - 2 wheels;Cane - single point;Bedside commode      Prior Function Level of Independence: Needs assistance   Gait / Transfers Assistance Needed: modified independent with use of rollator walker           Hand Dominance   Dominant  Hand: Right    Extremity/Trunk Assessment   Upper Extremity Assessment: Defer to OT evaluation           Lower Extremity Assessment: RLE deficits/detail;LLE deficits/detail;Generalized weakness RLE Deficits / Details: limited knee  flexion and ankle DF stiffness somewhat improved with AROM, but strength grossly 3+/5 LLE Deficits / Details: limited knee flexion and ankle DF stiffness somewhat improved with AROM, but strength grossly 3+/5  Cervical / Trunk Assessment: Kyphotic  Communication   Communication: No difficulties  Cognition Arousal/Alertness: Awake/alert Behavior During Therapy: WFL for tasks assessed/performed Overall Cognitive Status: Impaired/Different from baseline Area of Impairment: Attention;Memory;Following commands;Safety/judgement;Awareness;Problem solving   Current Attention Level: Sustained Memory: Decreased short-term memory Following Commands: Follows one step commands inconsistently Safety/Judgement: Decreased awareness of safety;Decreased awareness of deficits   Problem Solving: Slow processing;Difficulty sequencing General Comments: pulling on Coretrack looking for suction    General Comments General comments (skin integrity, edema, etc.): spouse present during treatment, seemed concerned that pt almost didn't make it due to oxygen raised to 4LPM on regular floor prior to needing intubation.    Exercises General Exercises - Lower Extremity Ankle Circles/Pumps: AAROM;Both;5 reps;Supine Heel Slides: AROM;AAROM;Both;5 reps;Supine      Assessment/Plan    PT Assessment Patient needs continued PT services  PT Diagnosis Abnormality of gait;Difficulty walking;Generalized weakness   PT Problem List Decreased strength;Decreased range of motion;Decreased balance;Decreased mobility;Decreased safety awareness;Cardiopulmonary status limiting activity;Decreased activity tolerance;Decreased cognition;Decreased knowledge of use of DME  PT Treatment Interventions DME instruction;Gait training;Therapeutic activities;Therapeutic exercise;Patient/family education;Stair training;Functional mobility training;Neuromuscular re-education;Balance training   PT Goals (Current goals can be found in the Care  Plan section) Acute Rehab PT Goals Patient Stated Goal: To return home PT Goal Formulation: With patient/family Time For Goal Achievement: 11/29/15 Potential to Achieve Goals: Good    Frequency Min 3X/week   Barriers to discharge        Co-evaluation               End of Session Equipment Utilized During Treatment: Gait belt;Oxygen Activity Tolerance: Patient limited by fatigue Patient left: in chair;with call bell/phone within reach;with chair alarm set;with family/visitor present Nurse Communication: Mobility status         Time: [id number] PT Time Calculation (min) (ACUTE ONLY): 32 min   Charges:   PT Evaluation $PT Eval High Complexity: 1 Procedure PT Treatments $Therapeutic Activity: 8-22 mins   PT G CodesReginia Naas November 24, 2015, 2:47 PM  Magda Kiel, [place] 24-Nov-2015

## 2015-11-20 NOTE — Progress Notes (Signed)
[facility] Progress Note Patient Name: Laura [name] DOB: October 10, 1929 MRN: [id number]   Date of Service  11/20/2015  HPI/Events of Note  Low potassium  eICU Interventions  replaced     Intervention Category Minor Interventions: Electrolytes abnormality - evaluation and management  Mauri Brooklyn, P 11/20/2015, 6:15 AM

## 2015-11-20 NOTE — Progress Notes (Signed)
PULMONARY / CRITICAL CARE MEDICINE   Name: Laura [name] MRN: [id number] DOB: 12/29/1929    ADMISSION DATE:  11/08/2015 LOS 9 days  REFERRING MD:  [facility]  CHIEF COMPLAINT:  SDH  BRIEF 82 yo admitted 8/4 with SDH from fall. NS followed without surgical intervention needed. Transferred to floor and to [facility] service 8/8. During the night of 8/10 she became somnolent and CT scan revealed increased left SDH and she required intubation for airway protection. Family to decide how aggressive her care is to be. NS to evaluate.    CULTURES: MRSA PCR - neg Blood 11/18/2015 Tracheal aspirate 11/18/2015      ANTIBIOTICS:  Anti-infectives    Start     Dose/Rate Route Frequency Ordered Stop   11/18/15 1200  piperacillin-tazobactam (ZOSYN) IVPB 3.375 g  Status:  Discontinued     3.375 g 12.5 mL/hr over 240 Minutes Intravenous Every 8 hours 11/18/15 0952 11/18/15 1002   11/18/15 1200  ceFEPIme (MAXIPIME) 2 g in dextrose 5 % 50 mL IVPB     2 g 100 mL/hr over 30 Minutes Intravenous Every 24 hours 11/18/15 1002           SIGNIFICANT EVENTS: 8/10 CT head with expanded L sdh 8/10 intubated    11/17/15 - RN unable to get OG/NG. D/w Dr Saintclair Halsted - poor prognosis for surgery and not c/w patient goals per his family conversation. Currently on prn sedation and following comments. Mild restlessnes + . Dr Saintclair Halsted to meet wit family again  11/18/15 - was not ready for extubation yesterday due to somnolence. Goal is for medical extubation next few to several days. Dr Dayton Scrape EF is actually at baseline ahd she has diast chf.Rec diureses but creat rising and she is down 4#. Overnight agitated and febrile. Unble to get feeding tube duet to stricture - IR awaited. Family describes ? Zenkers diverticulum opd procedure several years ago but in EMR UGI endoscopy march 2016 showed non-obstructing Schatzki ring    11/19/15 - doing sbt. On precedex. Sleepy but easily arousable and can get agitated; communicated  to RN that she wants extubation - RN felt patient had capacity. Off fentanyl gtt. Fever reduced with maxipime. Family at bedside: prfer extubation 11/20/2015    SUBJECTIVE/OVERNIGHT/INTERVAL HX 11/20/15: s/p extubated yesterday. Dong well. Did BiPAP 4h last nght and liked it. Feels better . Fully oriented per RN. Able to take clears. Still has panda and foley. Mild hypoglycemia and taking OJ now. Fmaily reluctant about o2 high levels due to hypercapnia risk and floor transfer. Only on 1L [place] -Pulse ox 97%; 83% on Room air. They are keen she continue QHS nocturnal cpap/bipap ; home regiment   VITAL SIGNS: BP (!) 149/97   Pulse 80   Temp 98.1 F (36.7 C) (Oral)   Resp 20   Ht 5\' 3"  (1.6 m)   Wt 85.1 kg (187 lb 9.8 oz)   SpO2 98%   BMI 33.23 kg/m   HEMODYNAMICS:    VENTILATOR SETTINGS: Vent Mode: BIPAP;PSV FiO2 (%):  [30 %-40 %] 40 % PEEP:  [5 cmH20] 5 cmH20 Pressure Support:  [10 cmH20] 10 cmH20  INTAKE / OUTPUT: I/O last 3 completed shifts: In: 1214.2 [I.V.:994.2; NG/GT:120; IV Piggyback:100] Out: 2430 [Urine:2430]  PHYSICAL EXAMINATION: General:  Obese WF off vent Neuro: - .RASS +1. CAM-ICU neg for deliruim. Moves all 4s HEENT:  opanda +. Voice hoarse - improvnig Cardiovascular: HSR RRR Lungs:  Coarse  And sync with vent Abdomen:  *OBESE +  BS Musculoskeletal:  Itact Skin:  Cool and dry, vascular changes lower ext LABS:   PULMONARY  Recent Labs Lab 11/17/15 0558 11/17/15 0900 11/19/15 0335  PHART 7.249* 7.486* 7.540*  PCO2ART 101* 55.8* 40.6  PO2ART 55.3* 134.0* 69.4*  HCO3 42.7* 42.5* 34.6*  TCO2 45.8 44 35.8  O2SAT 83.2 99.0 93.4    CBC  Recent Labs Lab 11/18/15 0249 11/19/15 0445 11/20/15 0448  HGB 12.9 14.1 14.2  HCT 41.6 44.7 45.4  WBC 9.8 9.1 11.7*  PLT 125* 100* 112*    COAGULATION No results for input(s): INR in the last 168 hours.  CARDIAC    Recent Labs Lab 11/14/15 1829 11/14/15 2340 11/15/15 0544  TROPONINI 0.09* 0.09* 0.09*    No results for input(s): PROBNP in the last 168 hours.   CHEMISTRY  Recent Labs Lab 11/14/15 1829  11/16/15 0804 11/17/15 0300 11/18/15 0249 11/19/15 0445 11/19/15 1937 11/20/15 0448  NA 140  < >  --  140 141 140 142 142  K 4.2  < >  --  3.8 3.6 2.8* 3.5 3.4*  CL 98*  < >  --  94* 91* 93* 98* 95*  CO2 34*  < >  --  39* 36* 36* 33* 31  GLUCOSE 244*  < >  --  134* 168* 133* 85 69  BUN 33*  < >  --  26* 31* 35* 38* 39*  CREATININE 1.03*  < >  --  0.86 1.20* 0.96 0.83 0.94  CALCIUM 9.5  < >  --  8.8* 9.0 9.2 8.9 9.0  MG 2.2  --  1.9 2.0  --  1.9  --   --   PHOS  --   --  2.7  --   --  3.8  --   --   < > = values in this interval not displayed. Estimated Creatinine Clearance: 46.9 mL/min (by C-G formula based on SCr of 0.94 mg/dL).   LIVER No results for input(s): AST, ALT, ALKPHOS, BILITOT, PROT, ALBUMIN, INR in the last 168 hours.   INFECTIOUS  Recent Labs Lab 11/18/15 1026 11/19/15 0445 11/20/15 0448  LATICACIDVEN 1.5  --   --   PROCALCITON <0.10 <0.10 <0.10     ENDOCRINE CBG (last 3)   Recent Labs  11/20/15 0004 11/20/15 0406 11/20/15 0802  GLUCAP 73 70 62*         IMAGING x48h  - image(s) personally visualized  -   highlighted in bold Dg Abd 1 View  Result Date: 11/18/2015 CLINICAL DATA:  NG tube placement, 20 cc Gastrografin injected EXAM: ABDOMEN - 1 VIEW COMPARISON:  08/01/2013 FINDINGS: There is NG feeding tube with tip in proximal small bowel at the level of ligament of Treitz. Contrast material noted within distal duodenum and jejunum. No evidence of contrast extravasation. IMPRESSION: NG feeding tube in place.  No evidence of contrast extravasation. Electronically Signed   By: Lahoma Crocker M.D.   On: 11/18/2015 13:33   Dg Chest Port 1 View  Result Date: 11/19/2015 CLINICAL DATA:  Endotracheal tube EXAM: PORTABLE CHEST 1 VIEW COMPARISON:  11/18/2015 FINDINGS: Endotracheal tube 2 cm above the carina unchanged. Central venous catheter tip in  the SVC unchanged. No pneumothorax Bibasilar atelectasis and small effusions unchanged.  No edema. IMPRESSION: Bibasilar atelectasis and  small pleural effusions unchanged. Endotracheal tube 2 cm above the carina. Electronically Signed   By: Franchot Gallo M.D.   On: 11/19/2015 07:23   Dg Chest Port 1  View  Result Date: 11/18/2015 CLINICAL DATA:  Line placement EXAM: PORTABLE CHEST 1 VIEW COMPARISON:  Chest radiograph from earlier today. FINDINGS: Endotracheal tube tip is 1.8 cm above the carina. Enteric tube enters stomach with the tip not seen on this image. Left PICC terminates at the cavoatrial junction. Stable cardiomediastinal silhouette with mild cardiomegaly. No pneumothorax. Probable trace bilateral pleural effusions. Hazy bibasilar lung opacities, unchanged. Borderline mild pulmonary edema, unchanged. IMPRESSION: 1. Well-positioned left PICC with the tip at the cavoatrial junction. 2. Endotracheal tube tip 1.8 cm above the carina, consider retracting 0.5-1 cm . 3. Stable cardiomegaly.  Stable borderline mild pulmonary edema. 4. Stable hazy bibasilar lung opacities, favor atelectasis. 5. Stable probable trace bilateral pleural effusions. Electronically Signed   By: Ilona Sorrel M.D.   On: 11/18/2015 17:44   Dg Chest Port 1 View  Result Date: 11/18/2015 CLINICAL DATA:  82 year old female with a history of postop line placement. EXAM: PORTABLE CHEST 1 VIEW COMPARISON:  11/18/2015 FINDINGS: Cardiomediastinal silhouette unchanged in size and contour. Endotracheal tube terminates approximately 2.2 cm above the carina. Enteric feeding tube has been placed projecting over the mediastinum and terminating out of the field of view. Veil like opacity bilateral lung bases with mixed interstitial and airspace opacities. No pneumothorax. In the interval, a left PICC has been placed. The catheter traverses the region the brachycephalic vein, with a kink in the catheter at the superior vena cava and the tip  terminating in a superiorly directed orientation. IMPRESSION: Similar appearance of basilar lung opacity, potentially a combination of pleural effusion, atelectasis, and/ or consolidation. Interval placement of left PICC. The catheter is kinked within the superior vena cava, with the final 3-4 cm directed cranially, potentially terminating within the azygos vein. These results will be called to the ordering clinician or representative by the Radiologist Assistant, and communication documented in the PACS or zVision Dashboard. Interval placement of enteric feeding tube terminating out of the field of view. Endotracheal tube terminates approximately 2.2 cm above the carina, unchanged from prior. Signed, Dulcy Fanny. Earleen Newport, DO [facility] Radiology Electronically Signed   By: Corrie Mckusick D.O.   On: 11/18/2015 16:00   Dg Naso G Tube Plc W/fl-no Rad  Result Date: 11/18/2015 CLINICAL DATA:  NASO G TUBE PLACEMENT WITH FLUORO Fluoroscopy was utilized by the requesting physician.  No radiographic interpretation.     DISCUSSION: 82 yo admitted 8/4 with SDH from fall. NS followed without surgical intervention needed. Transferred to floor and to [facility] service 8/8. During the night of 8/10 she became somnolent and CT scan revealed increased left SDH and she required intubation for airway protection. Family to decide how aggressive her care is to be. NS to evaluate.  ASSESSMENT / PLAN:  PULMONARY A:  COPD - severe gold stage 10 Aug 2015 pft + baseline hypercarbia + sensitivity to o2   VDRF secondary to expanding SDH with inability to protect airway and hypercapnia for higher o2 in floor per family  - 8/13- doing well post extubation > 18h. Needing 1L [place] to maintain pulse ox > 88%  P:   Vent bundle BD's + duoneb BiPAP QHS and then check abg 11/21/15 and then decide on bipap v cpap qhs Pulse ox goal > 88% (she gets hypercapnic easily and family very concerned  that floor RNs titrate o2 up easily)  CARDIOVASCULAR A:  CAD Recent SVT - hypertensive with acute on chronic diast chf (echo this admit with ef 45% but  Dr Wynonia Lawman feels is under-read) - PACss and she is at risk for a Fib  - diuresed 4#  P:  Lopressors prn IV (dc po); reintroduce po 11/21/15 and beyond by [facility] lasix t0 20mg  IV daily - then chagne to home regimen from 11/21/15   RENAL Lab Results  Component Value Date   CREATININE 0.94 11/20/2015   CREATININE 0.83 11/19/2015   CREATININE 0.96 11/19/2015   CREATININE 0.78 05/05/2013    A:  Mild AKI - improved Low K with lasix - repleted 11/20/15 P:     Follow creatine  GASTROINTESTINAL A:   Hx of schatzki ring June 2016 endoscopy partial non-obstruction (dr Zenovia Jarred). But otherwise normal UGI scoe    - Unable to gt NG/OG 11/17/2015; needed IR Cortrak  - 11/20/15 - tolerating clears post extubation  P:   H2 blocker Advance diet as tolerated and then dc panda if well  HEMATOLOGIC A:   Fall with SDH P:   Monitor  INFECTIOUS  Recent Labs Lab 11/16/15 0652 11/17/15 0300 11/18/15 0249 11/19/15 0445 11/20/15 0448  WBC 7.1 7.9 9.8 9.1 11.7*    Recent Labs Lab 11/18/15 1026 11/19/15 0445 11/20/15 0448  PROCALCITON <0.10 <0.10 <0.10    A:   Febrile 11/18/15  - reslved fever after abx  P:   Culture pending since 11/18/15 cefpemine 11/18/15; need to narrow based on culture eesu;ts - given normal PCT would recommend dc abx < 5d if culture negative  ENDOCRINE   A:   No acute issue P:   Follow glucose  NEUROLOGIC A:   SDH secondary to fall  11/17/15 - per Dr Saintclair Halsted only mild woprsening of SDH. Surgery not part of patient goals and patient is high risk candidate (s/p decadron)  11/20/15 - acute encephalopathy resolved; normal mental status (decadron and keppra were dc'ed)  P:    precedex RASS goal: 0 Minimal sedation insetting of expanding SDH - do prn sedation Zoloft home dose to continue now for  baseline depression   FAMILY  - no family at bedside 11/20/2015   - Inter-disciplinary family meet or Palliative Care meeting due by:  8/17 - dr Saintclair Halsted has initiated this process 11/17/2015 and dR Medina Degraffenreid did 11/17/15 - DNR if arrests. Full medical care. Reassess goals daily. Last updated 11/19/15   GLOBAL Move to sdu  Under [facility] 11/20/2015; PCM for COPD fu and ensuring opd fu. Hypercapnia results in bounce back to ICU per family     Dr. Brand Males, M.D., [facility].C.P Pulmonary and Critical Care Medicine Staff Physician [facility] Pager: [phone], If no answer or between  15:00h - 7:00h: call [phone]  11/20/2015 8:08 AM

## 2015-11-20 NOTE — Progress Notes (Signed)
Patient wanted a break off of BIPAP. Placed back on 2 LPM nasal cannula. Tolerating well. RT will continue to monitor.

## 2015-11-20 NOTE — Progress Notes (Signed)
Hypoglycemic Event  CBG: 62   Treatment: 15 GM carbohydrate snack  Symptoms: Hungry  Follow-up CBG: [id number] CBG Result:95  Possible Reasons for Event: Inadequate meal intake  Comments/MD notified:Dr. Chase Caller, CCM notified and aware of hypoglycemic event     Laura [name]

## 2015-11-21 DIAGNOSIS — Z7189 Other specified counseling: Secondary | ICD-10-CM

## 2015-11-21 DIAGNOSIS — J9601 Acute respiratory failure with hypoxia: Secondary | ICD-10-CM

## 2015-11-21 DIAGNOSIS — G9340 Encephalopathy, unspecified: Secondary | ICD-10-CM | POA: Diagnosis present

## 2015-11-21 LAB — BLOOD GAS, ARTERIAL
ACID-BASE EXCESS: 6.9 mmol/L — AB (ref 0.0–2.0)
BICARBONATE: 32.3 meq/L — AB (ref 20.0–24.0)
DRAWN BY: 398661
Delivery systems: POSITIVE
Expiratory PAP: 5
FIO2: 40
Inspiratory PAP: 10
MODE: POSITIVE
O2 SAT: 98.1 %
PATIENT TEMPERATURE: 98.6
PCO2 ART: 59.7 mmHg — AB (ref 35.0–45.0)
PH ART: 7.353 (ref 7.350–7.450)
PO2 ART: 126 mmHg — AB (ref 80.0–100.0)
TCO2: 34.2 mmol/L (ref 0–100)

## 2015-11-21 LAB — CBC WITH DIFFERENTIAL/PLATELET
Basophils Absolute: 0 10*3/uL (ref 0.0–0.1)
Basophils Relative: 0 %
Eosinophils Absolute: 0.6 10*3/uL (ref 0.0–0.7)
Eosinophils Relative: 6 %
HEMATOCRIT: 44.3 % (ref 36.0–46.0)
HEMOGLOBIN: 13.7 g/dL (ref 12.0–15.0)
LYMPHS PCT: 9 %
Lymphs Abs: 0.8 10*3/uL (ref 0.7–4.0)
MCH: 30 pg (ref 26.0–34.0)
MCHC: 30.9 g/dL (ref 30.0–36.0)
MCV: 96.9 fL (ref 78.0–100.0)
MONO ABS: 0.7 10*3/uL (ref 0.1–1.0)
MONOS PCT: 8 %
NEUTROS ABS: 7.1 10*3/uL (ref 1.7–7.7)
NEUTROS PCT: 77 %
Platelets: 98 10*3/uL — ABNORMAL LOW (ref 150–400)
RBC: 4.57 MIL/uL (ref 3.87–5.11)
RDW: 14.5 % (ref 11.5–15.5)
WBC: 9.2 10*3/uL (ref 4.0–10.5)

## 2015-11-21 LAB — BASIC METABOLIC PANEL
Anion gap: 6 (ref 5–15)
BUN: 39 mg/dL — AB (ref 6–20)
CALCIUM: 8.9 mg/dL (ref 8.9–10.3)
CHLORIDE: 101 mmol/L (ref 101–111)
CO2: 33 mmol/L — AB (ref 22–32)
CREATININE: 0.91 mg/dL (ref 0.44–1.00)
GFR calc Af Amer: >60 mL/min (ref >60)
GFR calc non Af Amer: 57 mL/min — ABNORMAL LOW (ref >60)
GLUCOSE: 101 mg/dL — AB (ref 65–99)
Potassium: 3.7 mmol/L (ref 3.5–5.1)
Sodium: 140 mmol/L (ref 135–145)

## 2015-11-21 LAB — GLUCOSE, CAPILLARY
GLUCOSE-CAPILLARY: 99 mg/dL (ref 65–99)
Glucose-Capillary: 101 mg/dL — ABNORMAL HIGH (ref 65–99)
Glucose-Capillary: 90 mg/dL (ref 65–99)

## 2015-11-21 LAB — PHOSPHORUS: Phosphorus: 4.1 mg/dL (ref 2.5–4.6)

## 2015-11-21 LAB — MAGNESIUM: Magnesium: 2.1 mg/dL (ref 1.7–2.4)

## 2015-11-21 MED ORDER — ACETAMINOPHEN 325 MG PO TABS: 650.0000 mg | ORAL_TABLET | ORAL | Status: DC | PRN

## 2015-11-21 MED ORDER — METOPROLOL TARTRATE 12.5 MG HALF TABLET: 12.5000 mg | ORAL_TABLET | Freq: Two times a day (BID) | ORAL | Status: DC

## 2015-11-21 MED ORDER — ALBUTEROL SULFATE (2.5 MG/3ML) 0.083% IN NEBU: 3.0000 mL | INHALATION_SOLUTION | RESPIRATORY_TRACT | Status: DC | PRN

## 2015-11-21 MED ORDER — SERTRALINE HCL 50 MG PO TABS
50.0000 mg | ORAL_TABLET | Freq: Every day | ORAL | Status: DC
  Administered 2015-11-22: 50 mg
  Filled 2015-11-21: qty 1

## 2015-11-21 MED ORDER — SODIUM CHLORIDE 0.9 % IV SOLN
INTRAVENOUS | Status: DC
  Administered 2015-11-22 – 2015-11-23 (×2): via INTRAVENOUS

## 2015-11-21 NOTE — Progress Notes (Signed)
SLP Cancellation Note  Patient Details Name: ADELEY HODGIN MRN: [id number] DOB: January 01, 1930   Cancelled treatment:       Reason Eval/Treat Not Completed: Medical issues which prohibited therapy. Pt with change in mental status today and requiring BiPAP. Discussed with RN - will hold swallow evaluation for today.1   Germain Osgood 11/21/2015, 2:03 PM  Germain Osgood, M.A. CCC-SLP [phone]

## 2015-11-21 NOTE — Progress Notes (Addendum)
PULMONARY / CRITICAL CARE MEDICINE   Name: Laura [name] MRN: [id number] DOB: [birth year]    ADMISSION DATE:  12/06/2015 LOS 10 days  REFERRING MD:  [facility]  CHIEF COMPLAINT:  SDH  BRIEF 82 yo admitted 8/4 with SDH from fall. NS followed without surgical intervention needed. Transferred to floor and to [facility] service 8/8. During the night of 8/10 she became somnolent and CT scan revealed increased left SDH and she required intubation for airway protection. Family to decide how aggressive her care is to be. NS to evaluate.  CULTURES: MRSA PCR - neg Blood 11/18/2015 Tracheal aspirate 11/18/2015  ANTIBIOTICS:  Anti-infectives    Start     Dose/Rate Route Frequency Ordered Stop   11/18/15 1200  piperacillin-tazobactam (ZOSYN) IVPB 3.375 g  Status:  Discontinued     3.375 g 12.5 mL/hr over 240 Minutes Intravenous Every 8 hours 11/18/15 0952 11/18/15 1002   11/18/15 1200  ceFEPIme (MAXIPIME) 2 g in dextrose 5 % 50 mL IVPB  Status:  Discontinued     2 g 100 mL/hr over 30 Minutes Intravenous Every 24 hours 11/18/15 1002 11/21/15 1036     SIGNIFICANT EVENTS: 8/10 CT head with expanded L sdh 8/10 intubated   11/17/15 - RN unable to get OG/NG. D/w Dr Saintclair Halsted - poor prognosis for surgery and not c/w patient goals per his family conversation. Currently on prn sedation and following comments. Mild restlessnes + . Dr Saintclair Halsted to meet wit family again  11/18/15 - was not ready for extubation yesterday due to somnolence. Goal is for medical extubation next few to several days. Dr Dayton Scrape EF is actually at baseline ahd she has diast chf.Rec diureses but creat rising and she is down 4#. Overnight agitated and febrile. Unble to get feeding tube duet to stricture - IR awaited. Family describes ? Zenkers diverticulum opd procedure several years ago but in EMR UGI endoscopy march 2016 showed non-obstructing Schatzki ring  11/19/15 - doing sbt. On precedex. Sleepy but easily arousable and can get agitated;  communicated to RN that she wants extubation - RN felt patient had capacity. Off fentanyl gtt. Fever reduced with maxipime. Family at bedside: prfer extubation 11/21/2015  SUBJECTIVE/OVERNIGHT/INTERVAL HX Patient much less responsive this AM.  VITAL SIGNS: BP (!) 153/94   Pulse 85   Temp 97.7 F (36.5 C) (Oral)   Resp (!) 21   Ht 5\' 3"  (1.6 m)   Wt 95.4 kg (210 lb 5.1 oz)   SpO2 98%   BMI 37.26 kg/m   HEMODYNAMICS:    VENTILATOR SETTINGS: Vent Mode: BIPAP FiO2 (%):  [40 %] 40 % PEEP:  [5 cmH20] 5 cmH20 Pressure Support:  [5 cmH20] 5 cmH20  INTAKE / OUTPUT: I/O last 3 completed shifts: In: 950 [P.O.:480; I.V.:350; NG/GT:70; IV Piggyback:50] Out: [id number] [Urine:1130; Stool:1]  PHYSICAL EXAMINATION: General:  Obese WF off vent Neuro: - .RASS +1. CAM-ICU neg for deliruim. Moves all 4s HEENT:  opanda +. Voice hoarse - improvnig Cardiovascular: HSR RRR Lungs:  Coarse  And sync with vent Abdomen:  *OBESE + BS Musculoskeletal:  Itact Skin:  Cool and dry, vascular changes lower ext  LABS:  PULMONARY  Recent Labs Lab 11/17/15 0558 11/17/15 0900 11/19/15 0335 11/20/15 1740 11/21/15 0404  PHART 7.249* 7.486* 7.540* 7.501* 7.353  PCO2ART 101* 55.8* 40.6 40.7 59.7*  PO2ART 55.3* 134.0* 69.4* 76.3* 126*  HCO3 42.7* 42.5* 34.6* 31.6* 32.3*  TCO2 45.8 44 35.8 32.8 34.2  O2SAT 83.2 99.0 93.4 95.4 98.1  CBC  Recent Labs Lab 11/19/15 0445 11/20/15 0448 11/21/15 0405  HGB 14.1 14.2 13.7  HCT 44.7 45.4 44.3  WBC 9.1 11.7* 9.2  PLT 100* 112* 98*   COAGULATION No results for input(s): INR in the last 168 hours.  CARDIAC  Recent Labs Lab 11/14/15 1829 11/14/15 2340 11/15/15 0544  TROPONINI 0.09* 0.09* 0.09*   No results for input(s): PROBNP in the last 168 hours.  CHEMISTRY  Recent Labs Lab 11/14/15 1829  11/16/15 0804 11/17/15 0300 11/18/15 0249 11/19/15 0445 11/19/15 1937 11/20/15 0448 11/21/15 0405  NA 140  < >  --  140 141 140 142 142 140  K  4.2  < >  --  3.8 3.6 2.8* 3.5 3.4* 3.7  CL 98*  < >  --  94* 91* 93* 98* 95* 101  CO2 34*  < >  --  39* 36* 36* 33* 31 33*  GLUCOSE 244*  < >  --  134* 168* 133* 85 69 101*  BUN 33*  < >  --  26* 31* 35* 38* 39* 39*  CREATININE 1.03*  < >  --  0.86 1.20* 0.96 0.83 0.94 0.91  CALCIUM 9.5  < >  --  8.8* 9.0 9.2 8.9 9.0 8.9  MG 2.2  --  1.9 2.0  --  1.9  --   --  2.1  PHOS  --   --  2.7  --   --  3.8  --   --  4.1  < > = values in this interval not displayed. Estimated Creatinine Clearance: 51.5 mL/min (by C-G formula based on SCr of 0.91 mg/dL).  LIVER No results for input(s): AST, ALT, ALKPHOS, BILITOT, PROT, ALBUMIN, INR in the last 168 hours.  INFECTIOUS  Recent Labs Lab 11/18/15 1026 11/19/15 0445 11/20/15 0448  LATICACIDVEN 1.5  --   --   PROCALCITON <0.10 <0.10 <0.10   ENDOCRINE CBG (last 3)   Recent Labs  11/21/15 0014 11/21/15 0401 11/21/15 0743  GLUCAP 99 101* 90   IMAGING x48h  - image(s) personally visualized  -   highlighted in bold  I reviewed CXR myself, pulmonary edema and atelectasis noted.  DISCUSSION: 82 yo admitted 8/4 with SDH from fall. NS followed without surgical intervention needed. Transferred to floor and to [facility] service 8/8. During the night of 8/10 she became somnolent and CT scan revealed increased left SDH and she required intubation for airway protection. Family to decide how aggressive her care is to be. NS to evaluate.  ASSESSMENT / PLAN:  PULMONARY A:  COPD - severe gold stage 10 Aug 2015 pft + baseline hypercarbia + sensitivity to o2 VDRF secondary to expanding SDH with inability to protect airway and hypercapnia for higher o2 in floor per family  P:   Vent bundle. BD's + duoneb. BiPAP now given evolving respiratory failure.  CARDIOVASCULAR A:  CAD Recent SVT  P:  Lasix as ordered. No levophed given code status.  Discussed with [facility]-MD.  Per discussion, will continue support today and if fails then will proceed with  comfort care.  Rush Farmer, M.D. [facility] Pulmonary/Critical Care Medicine. Pager: [phone]. After hours pager: [phone].  11/21/2015 11:20 AM

## 2015-11-21 NOTE — Progress Notes (Signed)
Nutrition Follow-up  DOCUMENTATION CODES:   Obesity unspecified  INTERVENTION:   If aggressive care recommend enteral nutrition therapy: Vital AF 1.2 @ 60 ml/hr Provides: 1440 ml, 1728 kcal, 108 grams protein, and 1167 ml H2O.   NUTRITION DIAGNOSIS:   Inadequate oral intake related to inability to eat as evidenced by NPO status. Ongoing.   GOAL:   Patient will meet greater than or equal to 90% of their needs Not met.   MONITOR:   Vent status, Labs, Weight trends, I & O's, Skin  ASSESSMENT:   82 yo admitted 8/4 with SDH from fall. NS followed without surgical intervention needed. Transferred to floor and to [facility] 8/8. During the night of 8/10 she became somnolent and CT scan revealed increased left SDH and she required intubation for airway protection.  8/11 nasoduodenal feeding tube placed by IR, remains in place 8/12 extubated 8/14 back on BiPAP, per MD if pt fails will transition to comfort tomorrow. Hx severe COPD.  Meds and labs reviewed  Diet Order:  Diet NPO time specified  Skin:  Wound (see comment) (hematoma hand, weeping in groin)  Last BM:  8/13  Height:   Ht Readings from Last 1 Encounters:  11/14/15 _0  (1.6 m)    Weight:   Wt Readings from Last 1 Encounters:  11/21/15 210 lb 5.1 oz (95.4 kg)    Ideal Body Weight:  52.3 kg  BMI:  Body mass index is 37.26 kg/m.  Estimated Nutritional Needs:   Kcal:  1700-1900  Protein:  105-115 grams  Fluid:  >/= 1.7 L/day  EDUCATION NEEDS:   No education needs identified at this time  Meta, [place], [place] Pager [phone] After Hours Pager

## 2015-11-21 NOTE — Progress Notes (Signed)
[place] TEAM 1 - Stepdown/ICU TEAM  Laura [name]  [id number] DOB: 1929-08-20 DOA: 12/06/2015 PCP: Jerlyn Ly, MD    Brief Narrative:  82 yo admitted 8/4 with SDH from fall. NS followed without surgical intervention needed. Transferred to floor and to [facility] 8/8. During the night of 8/10 she became somnolent and CT scan revealed increased left SDH and she required intubation for airway protection. Family to decide how aggressive her care is to be. NS to evaluate.  Subjective: The pt has been progressively less responsive over the last 24hrs.  She will presently follow simple commands, but does not speak.  She keeps her eyes closed.  Her respiratory effort is minimal, but she does not appear uncomfortable.    Assessment & Plan:  Fall with SDH per Dr Saintclair Halsted only mild worsening of SDH - surgery not part of patient goals and patient is high risk candidate (s/p decadron) - daughter confirms this at the bedside this morning   Obtundation Differential includes increased intracranial pressure, repeat/worseing bleeding, seizure activity, or respiratory source - family wishes to avoid aggressive interventions - cont supportive care and monitor mental status - family informed this may be indicative of pending death  COPD - severe gold stage 10 Aug 2015 pft + baseline hypercarbia + sensitivity to O2 - wean O2 down as low as possible - pt appears to be hypoventilating even on BIPAP, but her ABG is stable at present  VDRF secondary to expanding SDH with inability to protect airway and hypercapnia due to O2 doing well post extubation Pulse ox goal > 88% (she gets hypercapnic easily)  CAD No evidence of ACS  Recent SVT - PACs Low dose BB resumed per Cards - tele stable at this time   Acute on chronic diast CHF echo this admit with ef 45% but Dr Wynonia Lawman feels is under-read - no evidence of severe CHF at this time   Mild AKI improved  Hx of Schatzki ring June 2016   Febrile  11/18/15 resolved fever after 3 days of abx  DVT prophylaxis: SCDs Code Status: NO CODE BLUE - DNR - BIPAP IS ALLOWED Family Communication: spoke w/ daughter at bedside  Disposition Plan: SDU  Consultants:  NS PCCM  Procedures: 8/4 admit s/p fall w/ SDH 8/10 CT head with expanded L SDH - intubated - per Dr Saintclair Halsted poor prognosis for surgery and not c/w patient goals per his family conversation 8/11 Dr Lezlie Octave thinks EF is actually at baseline - Unble to get feeding tube due to stricture - IR awaited 8/12 SBT on precedex 8/13 extubated   Antimicrobials:  cefepime 8/11 > 8/13  Objective: Blood pressure (!) 159/68, pulse 77, temperature 97.7 F (36.5 C), temperature source Oral, resp. rate (!) 27, height 5\' 3"  (1.6 m), weight 95.4 kg (210 lb 5.1 oz), SpO2 100 %.  Intake/Output Summary (Last 24 hours) at 11/21/15 0939 Last data filed at 11/21/15 0800  Gross per 24 hour  Intake              340 ml  Output              826 ml  Net             -486 ml   Filed Weights   11/19/15 0440 11/20/15 0424 11/21/15 0350  Weight: 85.9 kg (189 lb 6 oz) 85.1 kg (187 lb 9.8 oz) 95.4 kg (210 lb 5.1 oz)    Examination: General: No acute respiratory distress evident though  pt appears to be hypoventilating  Lungs: poor air movement th/o all fields - no wheeze or crackles  Cardiovascular: Regular rate and rhythm without murmur gallop or rub  Abdomen: Nontender, nondistended, soft, bowel sounds positive, no rebound, no ascites, no appreciable mass Extremities: No significant cyanosis, clubbing, or edema bilateral lower extremities  CBC:  Recent Labs Lab 11/17/15 0300 11/18/15 0249 11/19/15 0445 11/20/15 0448 11/21/15 0405  WBC 7.9 9.8 9.1 11.7* 9.2  NEUTROABS  --  9.1* 8.0* 9.7* 7.1  HGB 13.5 12.9 14.1 14.2 13.7  HCT 46.0 41.6 44.7 45.4 44.3  MCV 100.9* 95.4 93.7 97.4 96.9  PLT 120* 125* 100* 112* 98*   Basic Metabolic Panel:  Recent Labs Lab 11/14/15 1829  11/16/15 0804  11/17/15 0300 11/18/15 0249 11/19/15 0445 11/19/15 1937 11/20/15 0448 11/21/15 0405  NA 140  < >  --  140 141 140 142 142 140  K 4.2  < >  --  3.8 3.6 2.8* 3.5 3.4* 3.7  CL 98*  < >  --  94* 91* 93* 98* 95* 101  CO2 34*  < >  --  39* 36* 36* 33* 31 33*  GLUCOSE 244*  < >  --  134* 168* 133* 85 69 101*  BUN 33*  < >  --  26* 31* 35* 38* 39* 39*  CREATININE 1.03*  < >  --  0.86 1.20* 0.96 0.83 0.94 0.91  CALCIUM 9.5  < >  --  8.8* 9.0 9.2 8.9 9.0 8.9  MG 2.2  --  1.9 2.0  --  1.9  --   --  2.1  PHOS  --   --  2.7  --   --  3.8  --   --  4.1  < > = values in this interval not displayed.   GFR: Estimated Creatinine Clearance: 51.5 mL/min (by C-G formula based on SCr of 0.91 mg/dL).  Liver Function Tests: No results for input(s): AST, ALT, ALKPHOS, BILITOT, PROT, ALBUMIN in the last 168 hours. No results for input(s): LIPASE, AMYLASE in the last 168 hours. No results for input(s): AMMONIA in the last 168 hours.  Cardiac Enzymes:  Recent Labs Lab 11/14/15 1829 11/14/15 2340 11/15/15 0544  TROPONINI 0.09* 0.09* 0.09*   CBG:  Recent Labs Lab 11/20/15 1657 11/20/15 1958 11/21/15 0014 11/21/15 0401 11/21/15 0743  GLUCAP 102* 92 99 101* 90    Recent Results (from the past 240 hour(s))  MRSA PCR Screening     Status: None   Collection Time: 11/12/15  1:24 AM  Result Value Ref Range Status   MRSA by PCR NEGATIVE NEGATIVE Final    Comment:        The GeneXpert MRSA Assay (FDA approved for NASAL specimens only), is one component of a comprehensive MRSA colonization surveillance program. It is not intended to diagnose MRSA infection nor to guide or monitor treatment for MRSA infections.   Culture, blood (Routine X 2) w Reflex to ID Panel     Status: None (Preliminary result)   Collection Time: 11/18/15 10:26 AM  Result Value Ref Range Status   Specimen Description BLOOD LEFT WRIST  Final   Special Requests IN PEDIATRIC BOTTLE 1CC  Final   Culture NO GROWTH 2 DAYS   Final   Report Status PENDING  Incomplete  Culture, blood (Routine X 2) w Reflex to ID Panel     Status: None (Preliminary result)   Collection Time: 11/18/15 10:34 AM  Result Value  Ref Range Status   Specimen Description BLOOD LEFT HAND  Final   Special Requests IN PEDIATRIC BOTTLE 1CC  Final   Culture NO GROWTH 2 DAYS  Final   Report Status PENDING  Incomplete  Culture, respiratory (NON-Expectorated)     Status: None   Collection Time: 11/18/15 11:37 AM  Result Value Ref Range Status   Specimen Description TRACHEAL ASPIRATE  Final   Special Requests NONE  Final   Gram Stain   Final    FEW WBC PRESENT, PREDOMINANTLY PMN RARE GRAM POSITIVE COCCI IN PAIRS RARE GRAM NEGATIVE COCCOBACILLI    Culture Consistent with normal respiratory flora.  Final   Report Status 11/20/2015 FINAL  Final     Scheduled Meds: . antiseptic oral rinse  7 mL Mouth Rinse q12n4p  . budesonide (PULMICORT) nebulizer solution  0.25 mg Nebulization BID  . ceFEPime (MAXIPIME) IV  2 g Intravenous Q24H  . chlorhexidine  15 mL Mouth Rinse BID  . famotidine  20 mg Oral QHS  . furosemide  20 mg Intravenous Daily  . ipratropium-albuterol  3 mL Nebulization Q6H  . metoprolol tartrate  12.5 mg Oral BID  . potassium chloride  20 mEq Oral Daily  . sertraline  50 mg Oral Q breakfast  . sodium chloride flush  10-40 mL Intracatheter Q12H  . sodium chloride flush  10-40 mL Intracatheter Q12H  . Vitamin D (Ergocalciferol)  50,000 Units Oral Q Wed     LOS: 10 days   Cherene Altes, MD [facility] Office  [phone] Pager - Text Page per Amion as per below:  On-Call/Text Page:      [email or web address]      password [email or web address]  If 7PM-7AM, please contact night-coverage [email or web address] Password [facility] 11/21/2015, 9:39 AM

## 2015-11-21 NOTE — Progress Notes (Signed)
eLink Physician-Brief Progress Note Patient Name: Laura [name] DOB: 16-Oct-1929 MRN: [id number]   Date of Service  11/21/2015  HPI/Events of Note  Change in mental status - patient is more lethargic than earlier. Patient is DNR/DNI except will accept BiPAP which she is currently on.  eICU Interventions  Will order ABG now.     Intervention Category Intermediate Interventions: Change in mental status - evaluation and management  Bronte Kropf [place] 11/21/2015, 3:56 AM

## 2015-11-21 NOTE — Progress Notes (Signed)
Called Elink regarding unequal pupils. Left pupil is 5 and the right pupil is 2 with both being sluggish. Reports no nausea and states headache is better after tylenol given prior. Moves all extremities and is alert and oriented times 4. Will continue to monitor. Joaquin Bend E, [place] 11/21/2015 [phone]

## 2015-11-21 NOTE — Care Management Important Message (Signed)
Important Message  Patient Details  Name: Laura [name] MRN: [id number] Date of Birth: [birth year]   Medicare Important Message Given:  Yes    Nathen May 11/21/2015, 10:41 AM

## 2015-11-22 DIAGNOSIS — Z515 Encounter for palliative care: Secondary | ICD-10-CM

## 2015-11-22 MED ORDER — SODIUM CHLORIDE 0.9 % IV SOLN
25.0000 ug/h | INTRAVENOUS | Status: DC
  Administered 2015-11-22: 50 ug/h via INTRAVENOUS
  Filled 2015-11-22 (×2): qty 50

## 2015-11-22 NOTE — Progress Notes (Signed)
Physical Therapy Discharge Patient Details Name: Laura [name] MRN: [id number] DOB: June 24, 1929 Today's Date: 11/22/2015 Time:  -     Patient discharged from PT services secondary to Pt for comfort care.Marland Kitchen  GP     Karalynn Cottone 11/22/2015, 11:25 AM  Suanne Marker PT [phone]

## 2015-11-22 NOTE — Progress Notes (Addendum)
PULMONARY / CRITICAL CARE MEDICINE   Name: Laura [name] MRN: [id number] DOB: 08-06-1929    ADMISSION DATE:  11/13/2015 LOS 11 days  REFERRING MD:  [facility]  CHIEF COMPLAINT:  SDH  BRIEF 82 yo admitted 8/4 with SDH from fall. NS followed without surgical intervention needed. Transferred to floor and to [facility] service 8/8. During the night of 8/10 she became somnolent and CT scan revealed increased left SDH and she required intubation for airway protection. Family to decide how aggressive her care is to be. NS to evaluate.  CULTURES: MRSA PCR - neg Blood 11/18/2015 Tracheal aspirate 11/18/2015  ANTIBIOTICS:  Anti-infectives    Start     Dose/Rate Route Frequency Ordered Stop   11/18/15 1200  piperacillin-tazobactam (ZOSYN) IVPB 3.375 g  Status:  Discontinued     3.375 g 12.5 mL/hr over 240 Minutes Intravenous Every 8 hours 11/18/15 0952 11/18/15 1002   11/18/15 1200  ceFEPIme (MAXIPIME) 2 g in dextrose 5 % 50 mL IVPB  Status:  Discontinued     2 g 100 mL/hr over 30 Minutes Intravenous Every 24 hours 11/18/15 1002 11/21/15 1036     SIGNIFICANT EVENTS: 8/10 CT head with expanded L sdh 8/10 intubated   11/17/15 - RN unable to get OG/NG. D/w Dr Saintclair Halsted - poor prognosis for surgery and not c/w patient goals per his family conversation. Currently on prn sedation and following comments. Mild restlessnes + . Dr Saintclair Halsted to meet wit family again  11/18/15 - was not ready for extubation yesterday due to somnolence. Goal is for medical extubation next few to several days. Dr Dayton Scrape EF is actually at baseline ahd she has diast chf.Rec diureses but creat rising and she is down 4#. Overnight agitated and febrile. Unble to get feeding tube duet to stricture - IR awaited. Family describes ? Zenkers diverticulum opd procedure several years ago but in EMR UGI endoscopy march 2016 showed non-obstructing Schatzki ring  11/19/15 - doing sbt. On precedex. Sleepy but easily arousable and can get agitated;  communicated to RN that she wants extubation - RN felt patient had capacity. Off fentanyl gtt. Fever reduced with maxipime. Family at bedside: prfer extubation 11/22/2015  SUBJECTIVE/OVERNIGHT/INTERVAL HX Unresponsive this AM on BiPAP.  VITAL SIGNS: BP (!) 148/89   Pulse 95   Temp 97.7 F (36.5 C) (Axillary)   Resp (!) 22   Ht 5\' 3"  (1.6 m)   Wt 84.6 kg (186 lb 8.2 oz)   SpO2 98%   BMI 33.04 kg/m   HEMODYNAMICS:    VENTILATOR SETTINGS: Vent Mode: BIPAP FiO2 (%):  [30 %-40 %] 30 % Set Rate:  [20 bmp] 20 bmp PEEP:  [5 cmH20] 5 cmH20  INTAKE / OUTPUT: I/O last 3 completed shifts: In: [id number] [I.V.:770; Other:15; NG/GT:90] Out: 840 [Urine:840]  PHYSICAL EXAMINATION: General:  Obese WF unresponsive on BiPAP. Neuro: - RASS 0. HEENT:  [place]/AT, PERRL, EOM-I and MMM. Cardiovascular: HSR RRR Lungs:  Coarse  And sync with vent Abdomen:  OBESE + BS Musculoskeletal:  Intact Skin:  Cool and dry, vascular changes lower ext  LABS:  PULMONARY  Recent Labs Lab 11/17/15 0558 11/17/15 0900 11/19/15 0335 11/20/15 1740 11/21/15 0404  PHART 7.249* 7.486* 7.540* 7.501* 7.353  PCO2ART 101* 55.8* 40.6 40.7 59.7*  PO2ART 55.3* 134.0* 69.4* 76.3* 126*  HCO3 42.7* 42.5* 34.6* 31.6* 32.3*  TCO2 45.8 44 35.8 32.8 34.2  O2SAT 83.2 99.0 93.4 95.4 98.1   CBC  Recent Labs Lab 11/19/15 0445 11/20/15 0448  11/21/15 0405  HGB 14.1 14.2 13.7  HCT 44.7 45.4 44.3  WBC 9.1 11.7* 9.2  PLT 100* 112* 98*   COAGULATION No results for input(s): INR in the last 168 hours.  CARDIAC No results for input(s): TROPONINI in the last 168 hours. No results for input(s): PROBNP in the last 168 hours.  CHEMISTRY  Recent Labs Lab 11/16/15 0804 11/17/15 0300 11/18/15 0249 11/19/15 0445 11/19/15 1937 11/20/15 0448 11/21/15 0405  NA  --  140 141 140 142 142 140  K  --  3.8 3.6 2.8* 3.5 3.4* 3.7  CL  --  94* 91* 93* 98* 95* 101  CO2  --  39* 36* 36* 33* 31 33*  GLUCOSE  --  134* 168* 133* 85 69  101*  BUN  --  26* 31* 35* 38* 39* 39*  CREATININE  --  0.86 1.20* 0.96 0.83 0.94 0.91  CALCIUM  --  8.8* 9.0 9.2 8.9 9.0 8.9  MG 1.9 2.0  --  1.9  --   --  2.1  PHOS 2.7  --   --  3.8  --   --  4.1   Estimated Creatinine Clearance: 48.3 mL/min (by C-G formula based on SCr of 0.91 mg/dL).  LIVER No results for input(s): AST, ALT, ALKPHOS, BILITOT, PROT, ALBUMIN, INR in the last 168 hours.  INFECTIOUS  Recent Labs Lab 11/18/15 1026 11/19/15 0445 11/20/15 0448  LATICACIDVEN 1.5  --   --   PROCALCITON <0.10 <0.10 <0.10   ENDOCRINE CBG (last 3)   Recent Labs  11/21/15 0014 11/21/15 0401 11/21/15 0743  GLUCAP 99 101* 90   IMAGING x48h  - image(s) personally visualized  -   highlighted in bold  I reviewed CXR myself, pulmonary edema and atelectasis noted.  DISCUSSION: 82 yo admitted 8/4 with SDH from fall. NS followed without surgical intervention needed. Transferred to floor and to [facility] service 8/8. During the night of 8/10 she became somnolent and CT scan revealed increased left SDH and she required intubation for airway protection. Family to decide how aggressive her care is to be. NS to evaluate.  ASSESSMENT / PLAN:  PULMONARY A:  COPD - severe gold stage 10 Aug 2015 pft + baseline hypercarbia + sensitivity to o2 VDRF secondary to expanding SDH with inability to protect airway and hypercapnia for higher o2 in floor per family  P:   D/C BiPAP. BD's + duoneb for comfort only. Titrate O2 for comfort.  CARDIOVASCULAR A:  CAD Recent SVT  P:  D/C lasix No levophed given code status.  Palliative Care:  Spoke with daughter at length bedside, after discussion, she wishes for comfort care at this point.  Will place orders for fentanyl for comfort and d/c BiPAP.  Will make DNR per family discussion.  The patient is critically ill with multiple organ systems failure and requires high complexity decision making for assessment and support, frequent evaluation and  titration of therapies, application of advanced monitoring technologies and extensive interpretation of multiple databases.   Critical Care Time devoted to patient care services described in this note is  35  Minutes. This time reflects time of care of this signee Dr Laura [name]. This critical care time does not reflect procedure time, or teaching time or supervisory time of PA/NP/Med student/Med Resident etc but could involve care discussion time.  Laura [name], M.D. [facility] Pulmonary/Critical Care Medicine. Pager: [phone]. After hours pager: [phone].  11/22/2015 10:25 AM

## 2015-11-22 NOTE — Progress Notes (Signed)
Between [id number] paged Dr. Sherral Hammers twice for transfer orders. No response.

## 2015-11-22 NOTE — Progress Notes (Signed)
SLP Cancellation Note  Patient Details Name: Laura [name] MRN: [id number] DOB: [birth year]   Cancelled treatment:       Reason Eval/Treat Not Completed: Other (comment). Discussed with RN - pt transitioning to full comfort care. She has been taken off BiPAP and remains minimally responsive. Will defer swallow evaluation at this time. Please reorder if we can be of assistance.   Germain Osgood 11/22/2015, 1:20 PM  Germain Osgood, M.A. CCC-SLP [phone]

## 2015-11-22 NOTE — Progress Notes (Signed)
PT Cancellation Note  Patient Details Name: Laura [name] MRN: [id number] DOB: 12-13-1929   Cancelled Treatment:    Reason Eval/Treat Not Completed: Medical issues which prohibited therapy (Pt on bipap)   Gilbert Manolis 11/22/2015, 9:12 AM [facility] PT [phone]

## 2015-11-22 NOTE — Progress Notes (Signed)
PROGRESS NOTE    Laura [name]  [id number] DOB: [birth year] DOA: 12/05/2015 PCP: Jerlyn Ly, MD   Brief Narrative:  82 yo WF PMHx Depression, CHF, COPD on 2.5L O2, OHS, OSA, MI, HTN, HLD, CAD, PVD, COPD,Subclinical Hypothyroidism, Nephrolithiasis who fell on 8/1 when trying to sit on her rollator. She fell backwards and hit the side of the refrigerator. Over the next 72h she had increasing trouble with lethargy, confusion and gait instability. She had another fall back on 7/15, when she hit her forehead, but without sequelae.  Admitted 8/4 with SDH from fall. Neurosurgery followed without surgical intervention needed. Transferred to floor and to [facility] 8/8. During the night of 8/10 she became somnolent and CT scan revealed increased left SDH and she required intubation for airway protection. Family to decide how aggressive her care is to be. NS to evaluate.   Subjective: 8/15  comfort care   Assessment & Plan:   Active Problems:   SDH (subdural hematoma) (HCC)   Subdural hematoma (HCC)   VT (ventricular tachycardia) (HCC)   Benign essential HTN   Gait disorder   Falls   Tachypnea   Elevated troponin   Thrombocytopenia (HCC)   SVT (supraventricular tachycardia) (HCC)   Acute encephalopathy   Goals of care, counseling/discussion   Fall with SDH per Dr Saintclair Halsted only mild worsening of SDH - surgery not part of patient goals and patient is high risk candidate (s/p decadron) - Husband, daughters at bedside and confirm comfort care   Obtundation Differential includes increased intracranial pressure, repeat/worseing bleeding, seizure activity, or respiratory source - family wishes to avoid aggressive interventions - cont supportive care and monitor mental status - family informed this may be indicative of pending death  COPD - severe gold stage 10 Aug 2015 pft + baseline hypercarbia + sensitivity to O2 - wean O2 down as low as possible - pt appears to be hypoventilating even on  BIPAP, but her ABG is stable at present  VDRF secondary to expanding SDH with inability to protect airway and hypercapnia due to O2 doing well post extubation Pulse ox goal > 88% (she gets hypercapnic easily)  CAD No evidence of ACS  Recent SVT - PACs Low dose BB resumed per Cards - tele stable at this time   Acute on chronic diast CHF echo this admit with ef 45% but Dr Wynonia Lawman feels is under-read - no evidence of severe CHF at this time   Mild AKI improved  Hx of Schatzki ring June 2016   Febrile 11/18/15 resolved fever after 3 days of abx   DVT prophylaxis: N/A Code Status:  confirm comfort care  Family Communication: Husband and daughters at bedside Disposition Plan:  confirm comfort care    Consultants:  NS PCCM  Procedures/Significant Events:  8/4 admit s/p fall w/ SDH 8/10 CT head with expanded L SDH - intubated - per Dr Saintclair Halsted poor prognosis for surgery and not c/w patient goals per his family conversation 8/11 Dr Lezlie Octave thinks EF is actually at baseline - Unble to get feeding tube due to stricture - IR awaited 8/12 SBT on precedex 8/13 extubated   Cultures  Antimicrobials: cefepime 8/11 > 8/13   Devices    LINES / TUBES:      Continuous Infusions: . sodium chloride 30 mL/hr at 11/22/15 0431     Objective: Vitals:   11/22/15 0806 11/22/15 0807 11/22/15 0811 11/22/15 0816  BP:      Pulse:      Resp:  Temp:    97.7 F (36.5 C)  TempSrc:    Axillary  SpO2: 98% 98% 98%   Weight:      Height:        Intake/Output Summary (Last 24 hours) at 11/22/15 0908 Last data filed at 11/22/15 0900  Gross per 24 hour  Intake              735 ml  Output              715 ml  Net               20 ml   Filed Weights   11/20/15 0424 11/21/15 0350 11/22/15 0230  Weight: 85.1 kg (187 lb 9.8 oz) 85.4 kg (188 lb 4.4 oz) 84.6 kg (186 lb 8.2 oz)    Examination:  General: Patient appears comfortable, nonresponsive  Lungs: Clear to auscultation  bilaterally without wheezes or crackles Cardiovascular: Regular rate and rhythm without murmur gallop or rub normal S1 and S2  .     Data Reviewed: Care during the described time interval was provided by me .  I have reviewed this patient's available data, including medical history, events of note, physical examination, and all test results as part of my evaluation. I have personally reviewed and interpreted all radiology studies.  CBC:  Recent Labs Lab 11/17/15 0300 11/18/15 0249 11/19/15 0445 11/20/15 0448 11/21/15 0405  WBC 7.9 9.8 9.1 11.7* 9.2  NEUTROABS  --  9.1* 8.0* 9.7* 7.1  HGB 13.5 12.9 14.1 14.2 13.7  HCT 46.0 41.6 44.7 45.4 44.3  MCV 100.9* 95.4 93.7 97.4 96.9  PLT 120* 125* 100* 112* 98*   Basic Metabolic Panel:  Recent Labs Lab 11/16/15 0804 11/17/15 0300 11/18/15 0249 11/19/15 0445 11/19/15 1937 11/20/15 0448 11/21/15 0405  NA  --  140 141 140 142 142 140  K  --  3.8 3.6 2.8* 3.5 3.4* 3.7  CL  --  94* 91* 93* 98* 95* 101  CO2  --  39* 36* 36* 33* 31 33*  GLUCOSE  --  134* 168* 133* 85 69 101*  BUN  --  26* 31* 35* 38* 39* 39*  CREATININE  --  0.86 1.20* 0.96 0.83 0.94 0.91  CALCIUM  --  8.8* 9.0 9.2 8.9 9.0 8.9  MG 1.9 2.0  --  1.9  --   --  2.1  PHOS 2.7  --   --  3.8  --   --  4.1   GFR: Estimated Creatinine Clearance: 48.3 mL/min (by C-G formula based on SCr of 0.91 mg/dL). Liver Function Tests: No results for input(s): AST, ALT, ALKPHOS, BILITOT, PROT, ALBUMIN in the last 168 hours. No results for input(s): LIPASE, AMYLASE in the last 168 hours. No results for input(s): AMMONIA in the last 168 hours. Coagulation Profile: No results for input(s): INR, PROTIME in the last 168 hours. Cardiac Enzymes: No results for input(s): CKTOTAL, CKMB, CKMBINDEX, TROPONINI in the last 168 hours. BNP (last 3 results) No results for input(s): PROBNP in the last 8760 hours. HbA1C: No results for input(s): HGBA1C in the last 72 hours. CBG:  Recent  Labs Lab 11/20/15 1657 11/20/15 1958 11/21/15 0014 11/21/15 0401 11/21/15 0743  GLUCAP 102* 92 99 101* 90   Lipid Profile: No results for input(s): CHOL, HDL, LDLCALC, TRIG, CHOLHDL, LDLDIRECT in the last 72 hours. Thyroid Function Tests: No results for input(s): TSH, T4TOTAL, FREET4, T3FREE, THYROIDAB in the last 72 hours. Anemia Panel: No  results for input(s): VITAMINB12, FOLATE, FERRITIN, TIBC, IRON, RETICCTPCT in the last 72 hours. Urine analysis:    Component Value Date/Time   COLORURINE YELLOW 08/10/2013 1700   APPEARANCEUR CLOUDY (A) 08/10/2013 1700   APPEARANCEUR Hazy 05/08/2013 1200   LABSPEC 1.016 08/10/2013 1700   LABSPEC 1.013 05/08/2013 1200   PHURINE 6.0 08/10/2013 1700   GLUCOSEU NEGATIVE 08/10/2013 1700   GLUCOSEU Negative 05/08/2013 1200   HGBUR MODERATE (A) 08/10/2013 1700   BILIRUBINUR NEGATIVE 08/10/2013 1700   BILIRUBINUR Negative 05/08/2013 1200   KETONESUR NEGATIVE 08/10/2013 1700   PROTEINUR NEGATIVE 08/10/2013 1700   UROBILINOGEN 0.2 08/10/2013 1700   NITRITE NEGATIVE 08/10/2013 1700   LEUKOCYTESUR SMALL (A) 08/10/2013 1700   LEUKOCYTESUR 2+ 05/08/2013 1200   Sepsis Labs: @LABRCNTIP (procalcitonin:4,lacticidven:4)  ) Recent Results (from the past 240 hour(s))  Culture, blood (Routine X 2) w Reflex to ID Panel     Status: None (Preliminary result)   Collection Time: 11/18/15 10:26 AM  Result Value Ref Range Status   Specimen Description BLOOD LEFT WRIST  Final   Special Requests IN PEDIATRIC BOTTLE 1CC  Final   Culture NO GROWTH 3 DAYS  Final   Report Status PENDING  Incomplete  Culture, blood (Routine X 2) w Reflex to ID Panel     Status: None (Preliminary result)   Collection Time: 11/18/15 10:34 AM  Result Value Ref Range Status   Specimen Description BLOOD LEFT HAND  Final   Special Requests IN PEDIATRIC BOTTLE 1CC  Final   Culture NO GROWTH 3 DAYS  Final   Report Status PENDING  Incomplete  Culture, respiratory (NON-Expectorated)      Status: None   Collection Time: 11/18/15 11:37 AM  Result Value Ref Range Status   Specimen Description TRACHEAL ASPIRATE  Final   Special Requests NONE  Final   Gram Stain   Final    FEW WBC PRESENT, PREDOMINANTLY PMN RARE GRAM POSITIVE COCCI IN PAIRS RARE GRAM NEGATIVE COCCOBACILLI    Culture Consistent with normal respiratory flora.  Final   Report Status 11/20/2015 FINAL  Final         Radiology Studies: No results found.      Scheduled Meds: . antiseptic oral rinse  7 mL Mouth Rinse q12n4p  . budesonide (PULMICORT) nebulizer solution  0.25 mg Nebulization BID  . chlorhexidine  15 mL Mouth Rinse BID  . ipratropium-albuterol  3 mL Nebulization Q6H  . sertraline  50 mg Per Tube Q breakfast   Continuous Infusions: . sodium chloride 30 mL/hr at 11/22/15 0431     LOS: 11 days    Time spent: 25 minutes    Keron Neenan, Geraldo Docker, MD [facility] Pager [phone]   If 7PM-7AM, please contact night-coverage [email or web address] Password [email or web address] 11/22/2015, 9:08 AM

## 2015-11-22 NOTE — Care Management Note (Signed)
Case Management Note  Patient Details  Name: Laura [name] MRN: [id number] Date of Birth: 1929-12-04  Subjective/Objective:    Pt admitted post a fall - went into resp distress and transferred to ICU.  Intubated 8/10 and extubated 8/13 - has remained on BIPAP since extubation                Action/Plan:  Pt discharge plan was originally CIR with SNF back up plan - however due to decline pt may move to full comfort care.  CM will continue to follow for discharge needs   Expected Discharge Date:                  Expected Discharge Plan:  [place]  In-House Referral:     Discharge planning Services  CM Consult  Post Acute Care Choice:    Choice offered to:     DME Arranged:    DME Agency:     HH Arranged:    [place] Agency:     Status of Service:  In process, will continue to follow  If discussed at Long Length of Stay Meetings, dates discussed:    Additional Comments:  Maryclare Labrador, RN 11/22/2015, 9:22 AM

## 2015-11-23 DIAGNOSIS — J431 Panlobular emphysema: Secondary | ICD-10-CM

## 2015-11-23 DIAGNOSIS — W19XXXA Unspecified fall, initial encounter: Secondary | ICD-10-CM

## 2015-11-23 LAB — CULTURE, BLOOD (ROUTINE X 2)
CULTURE: NO GROWTH
CULTURE: NO GROWTH

## 2015-12-09 NOTE — Progress Notes (Signed)
Patient TOD [phone]. No breath sounds or heart sounds present. No pulses, Pupils blown. Verified by Polly Cobia, RN. Family present at bedside. Patient's ring returned to spouse, no other belongings at bedside. [facility], CDS and Dr. Sherral Hammers notified of patient's TOD. Please find funeral home and family contact information in the post-mortem checklist.

## 2015-12-09 NOTE — Progress Notes (Signed)
25cc fentanyl gtt wasted in sink. Witnessed by Balinda Quails, RN

## 2015-12-09 NOTE — Discharge Summary (Signed)
Death Summary  KHUSBU USSERY [id number] DOB: 27-Jan-1930 DOA: 2015-11-24  PCP: Jerlyn Ly, MD PCP/Office notified: No  Admit date: 11-24-15 Date of Death: Dec 26, 2015  Final Diagnoses:  Active Problems:   SDH (subdural hematoma) (HCC)   Subdural hematoma (HCC)   VT (ventricular tachycardia) (HCC)   Benign essential HTN   Gait disorder   Falls   Tachypnea   Elevated troponin   Thrombocytopenia (HCC)   SVT (supraventricular tachycardia) (HCC)   Acute encephalopathy   Goals of care, counseling/discussion   Acute on chronic diastolic CHF (congestive heart failure) ([place])    Fall with SDH per Dr Saintclair Halsted only mild worsening of SDH - surgery not part of patient goals and patient is high risk candidate (s/p decadron) - Husband, daughters at bedside and confirm comfort care   Obtundation Differential includes increased intracranial pressure, repeat/worseing bleeding, seizure activity, or respiratory source - family wishes to avoid aggressive interventions - cont supportive care and monitor mental status - family informed this may be indicative of pending death  COPD - severe gold stage 10 Aug 2015 pft + baseline hypercarbia + sensitivity to O2 - wean O2 down as low as possible - pt appears to be hypoventilating even on BIPAP, but her ABG is stable at present  VDRF secondary to expanding SDH with inability to protect airway and hypercapnia due to O2 doing well post extubation Pulse ox goal > 88% (she gets hypercapnic easily)  CAD No evidence of ACS  Recent SVT - PACs Low dose BB resumed per Cards - tele stable at this time   Acute on chronic diast CHF echo this admit with ef 45% but Dr Wynonia Lawman feels is under-read - no evidence of severe CHF at this time   Mild AKI improved  Hx of Schatzki ring June 2016   Febrile 11/18/15 resolved fever after 3 days of abx   History of present illness:  82 yo WF PMHx Depression, CHF, COPD on 2.5L O2, OHS, OSA, MI, HTN, HLD,  CAD, PVD, COPD,Subclinical Hypothyroidism, Nephrolithiasis who fell on 8/1 when trying to sit on her rollator. She fell backwards and hit the side of the refrigerator. Over the next 72h she had increasing trouble with lethargy, confusion and gait instability. She had another fall back on 7/15, when she hit her forehead, but without sequelae.  Admitted 11-24-2022 with SDH from fall. Neurosurgery followed without surgical intervention needed. Transferred to floor and to [place]. During the night of 8/10 she became somnolent and CT scan revealed increased left SDH and she required intubation for airway protection. Family to decide how aggressive her care is to be. NS to evaluate. Patient's family decided to make her comfort care. Patient expired on 06-Dec-2015 at 0858.    Time: [id number]  Signed:  Dia Crawford, MD [facility] [phone]

## 2015-12-09 NOTE — Care Management Note (Signed)
Case Management Note  Patient Details  Name: Laura [name] MRN: [id number] Date of Birth: [birth year]  Subjective/Objective:    Pt admitted post a fall - went into resp distress and transferred to ICU.  Intubated 8/10 and extubated 8/13 - has remained on BIPAP since extubation                Action/Plan:  Pt discharge plan was originally CIR with SNF back up plan - however due to decline pt may move to full comfort care.  CM will continue to follow for discharge needs   Expected Discharge Date:                  Expected Discharge Plan:  [place]  In-House Referral:     Discharge planning Services  CM Consult  Post Acute Care Choice:    Choice offered to:     DME Arranged:    DME Agency:     HH Arranged:    [place] Agency:     Status of Service:  In process, will continue to follow  If discussed at Long Length of Stay Meetings, dates discussed:    Additional Comments: December 04, 2015 Pt expired 12/04/15 Maryclare Labrador, RN 2015-12-04, 9:39 AM

## 2015-12-09 NOTE — Progress Notes (Signed)
   08-Dec-2015 0956  Clinical Encounter Type  Visited With Family  Visit Type Death  Referral From Nurse  Spiritual Encounters  Spiritual Needs Grief support  Stress Factors  Family Stress Factors Loss  Family minister was present, chaplain visit was appreciated but I did not stay.  Maicey Barrientez, Chaplain

## 2015-12-09 DEATH — deceased

## 2015-12-13 DIAGNOSIS — I5033 Acute on chronic diastolic (congestive) heart failure: Secondary | ICD-10-CM | POA: Diagnosis present

## 2015-12-19 ENCOUNTER — Ambulatory Visit: Payer: Self-pay | Admitting: Pulmonary Disease

## 2016-09-23 IMAGING — CT CT HEAD W/O CM
3 series · 14 of 47 positions shown, 16 images · non-contrast
Comparison: None.

CLINICAL DATA: 85-year-old female with head and face injury with
headache and facial pain following fall yesterday. Initial
encounter.

EXAM:
CT HEAD WITHOUT CONTRAST
CT MAXILLOFACIAL WITHOUT CONTRAST
TECHNIQUE: Multidetector CT imaging of the head and maxillofacial structures
were performed using the standard protocol without intravenous
contrast. Multiplanar CT image reconstructions of the maxillofacial
structures were also generated.

[Series 5: coronal soft · coronal · 0.40mm/px · 3 of 106 slices shown]
[im 36/106  brain]
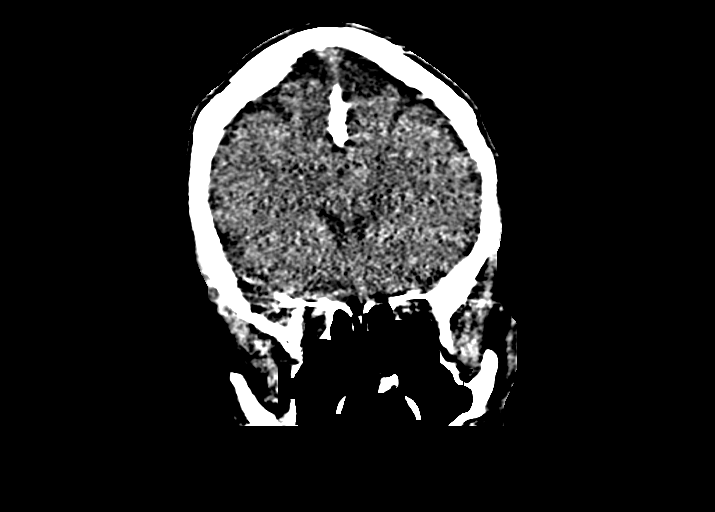
[im 47/106  brain]
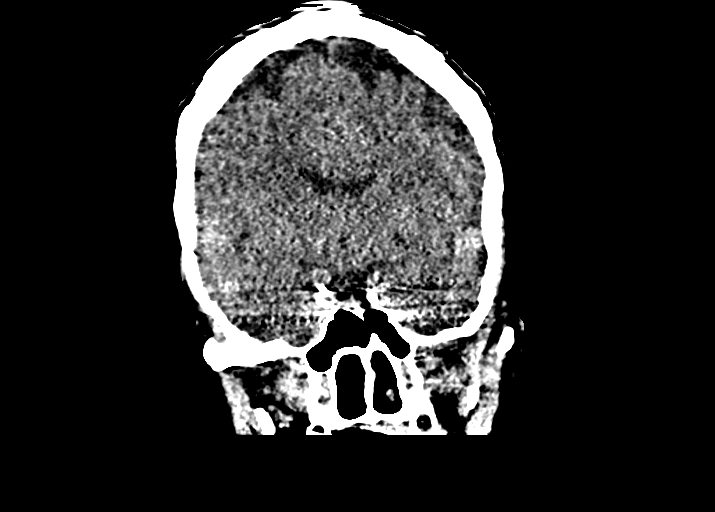
[im 59/106  brain]
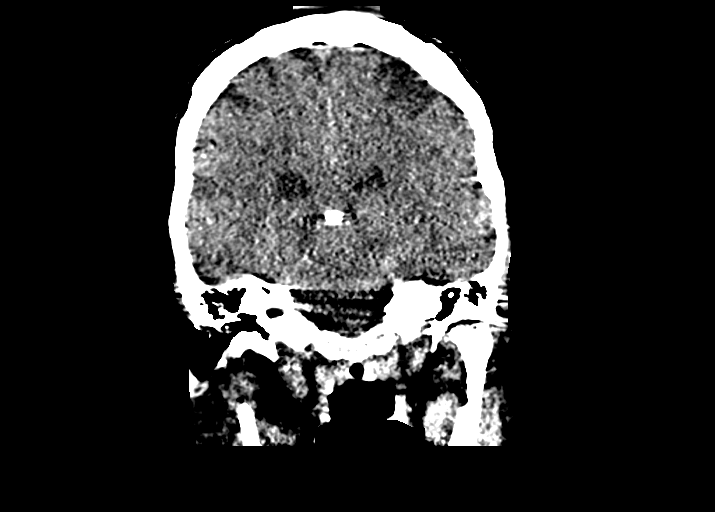

[Series 6: sagittal soft · sagittal · 0.41mm/px · 3 of 80 slices shown]
[im 27/80  brain]
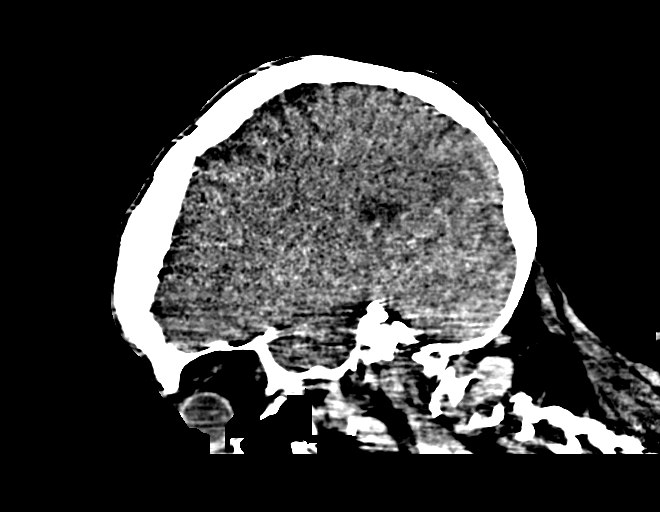
[im 40/80  brain]
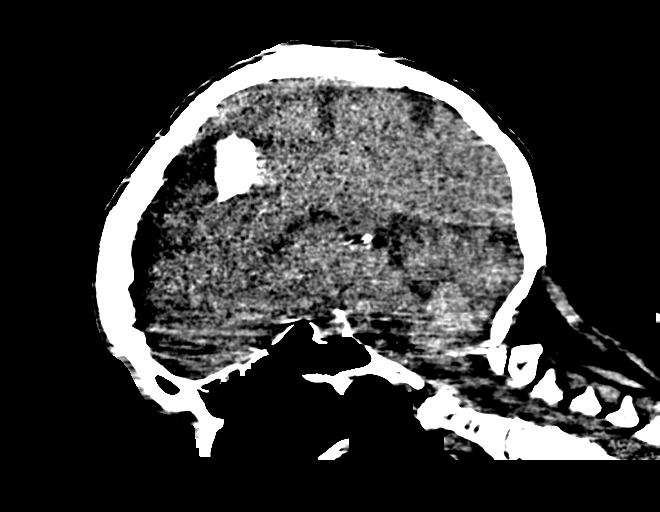
[im 53/80  brain]
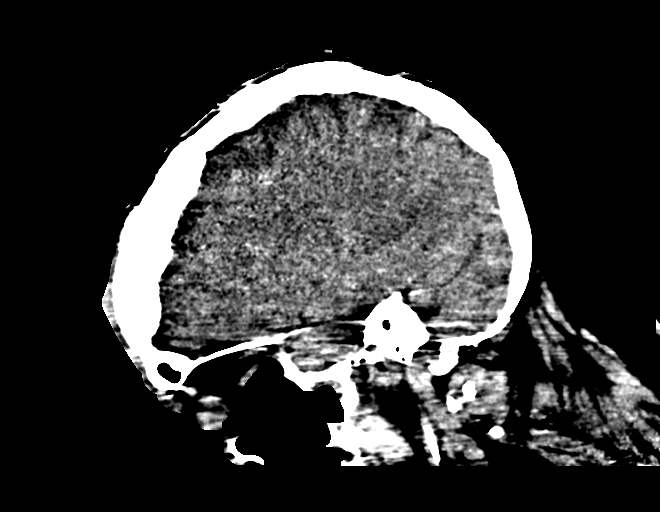

[Series 7: st thins · axial · 0.44mm/px · z∈[-186,-38]mm · 8 of 86 slices shown, 10 images]
[im 6/86  brain]
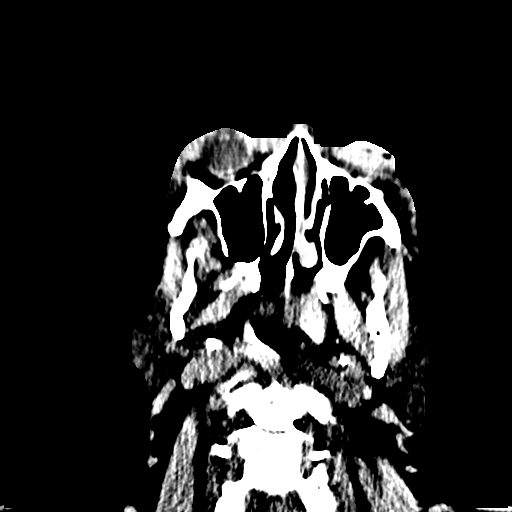
[im 6/86  bone]
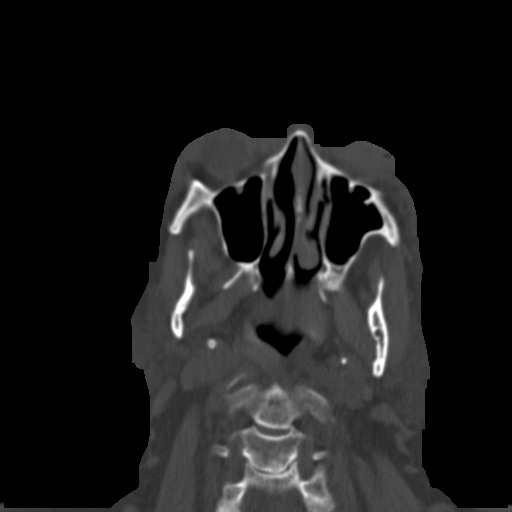
[im 18/86  brain]
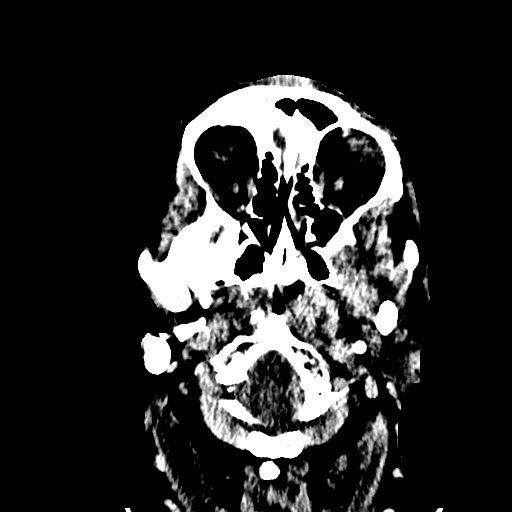
[im 27/86  brain]
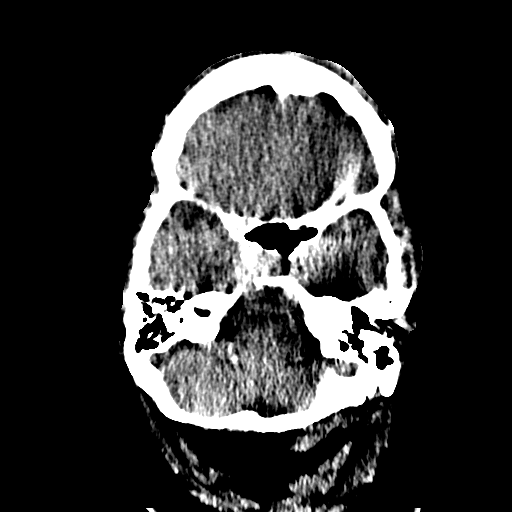
[im 39/86  brain]
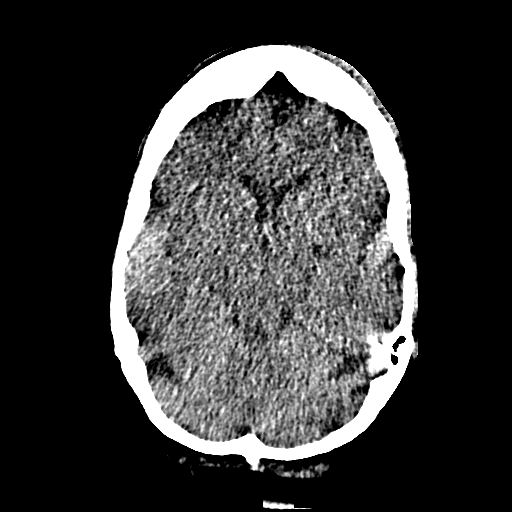
[im 47/86  brain]
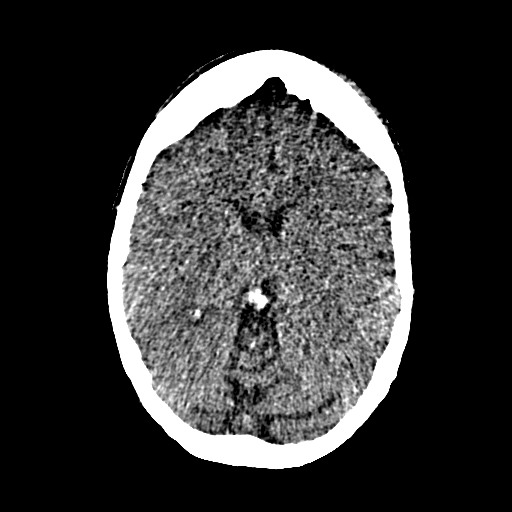
[im 47/86  bone]
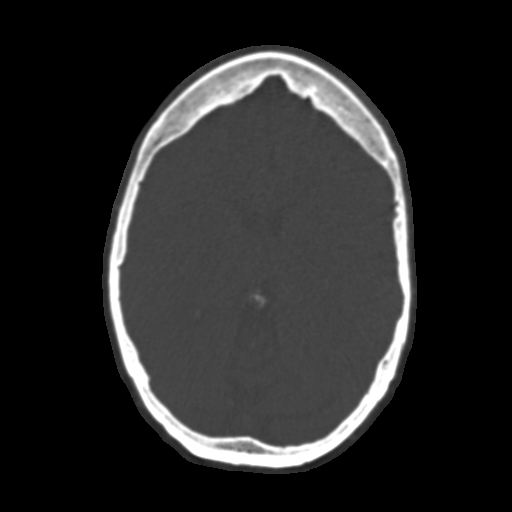
[im 59/86  brain]
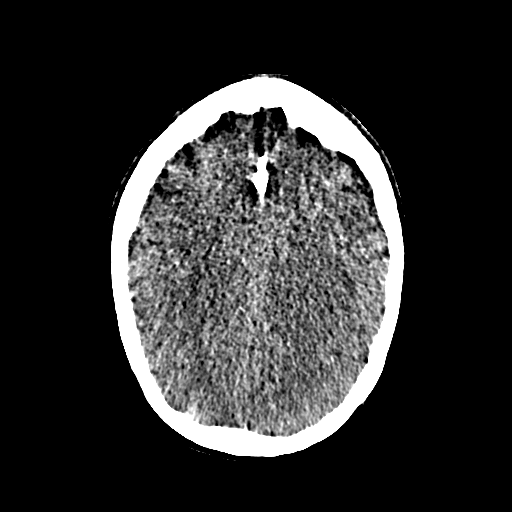
[im 68/86  brain]
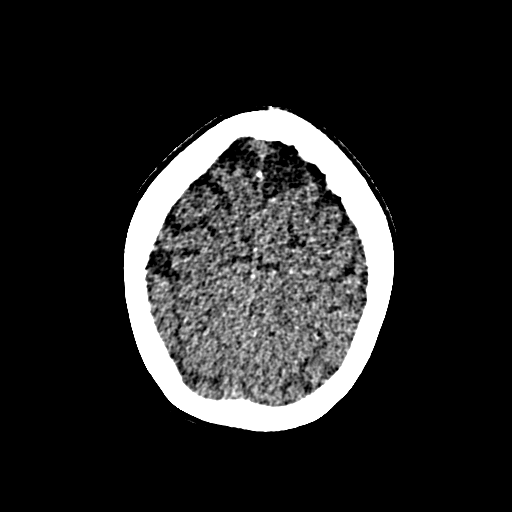
[im 80/86  brain]
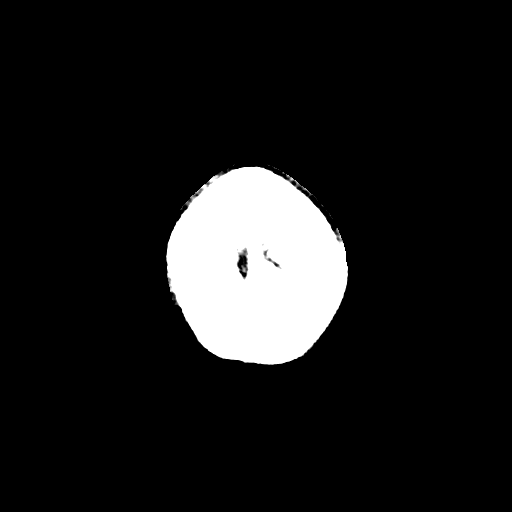

[14 of 47 positions shown; findings below may reference images not displayed]

FINDINGS: CT HEAD FINDINGS

Atrophy and mild probable chronic small-vessel white matter ischemic
changes noted.

No acute intracranial abnormalities are identified, including mass
lesion or mass effect, hydrocephalus, extra-axial fluid collection,
midline shift, hemorrhage, or acute infarction.

Left forehead soft tissue swelling/ hematoma noted. The visualized
bony calvarium is unremarkable.

CT MAXILLOFACIAL FINDINGS

Motion artifact decreases sensitivity.

No definite fracture, subluxation or dislocation.

The paranasal sinuses, mastoid air cells and middle/inner ears are
clear.

The orbits and globes are grossly unremarkable.

Left forehead soft tissue swelling/hematoma noted.
IMPRESSION: Left forehead soft tissue swelling/ hematoma without fracture.

No evidence of acute intracranial abnormality. Atrophy and probable
chronic small-vessel ischemic changes.

No evidence of facial fracture although motion artifact decreases
sensitivity

## 2016-10-21 IMAGING — CR DG CHEST 1V PORT
1 series · 1 of 1 positions shown · non-contrast
Comparison: 11/18/2015

CLINICAL DATA: Endotracheal tube

EXAM:
PORTABLE CHEST 1 VIEW

[AP]
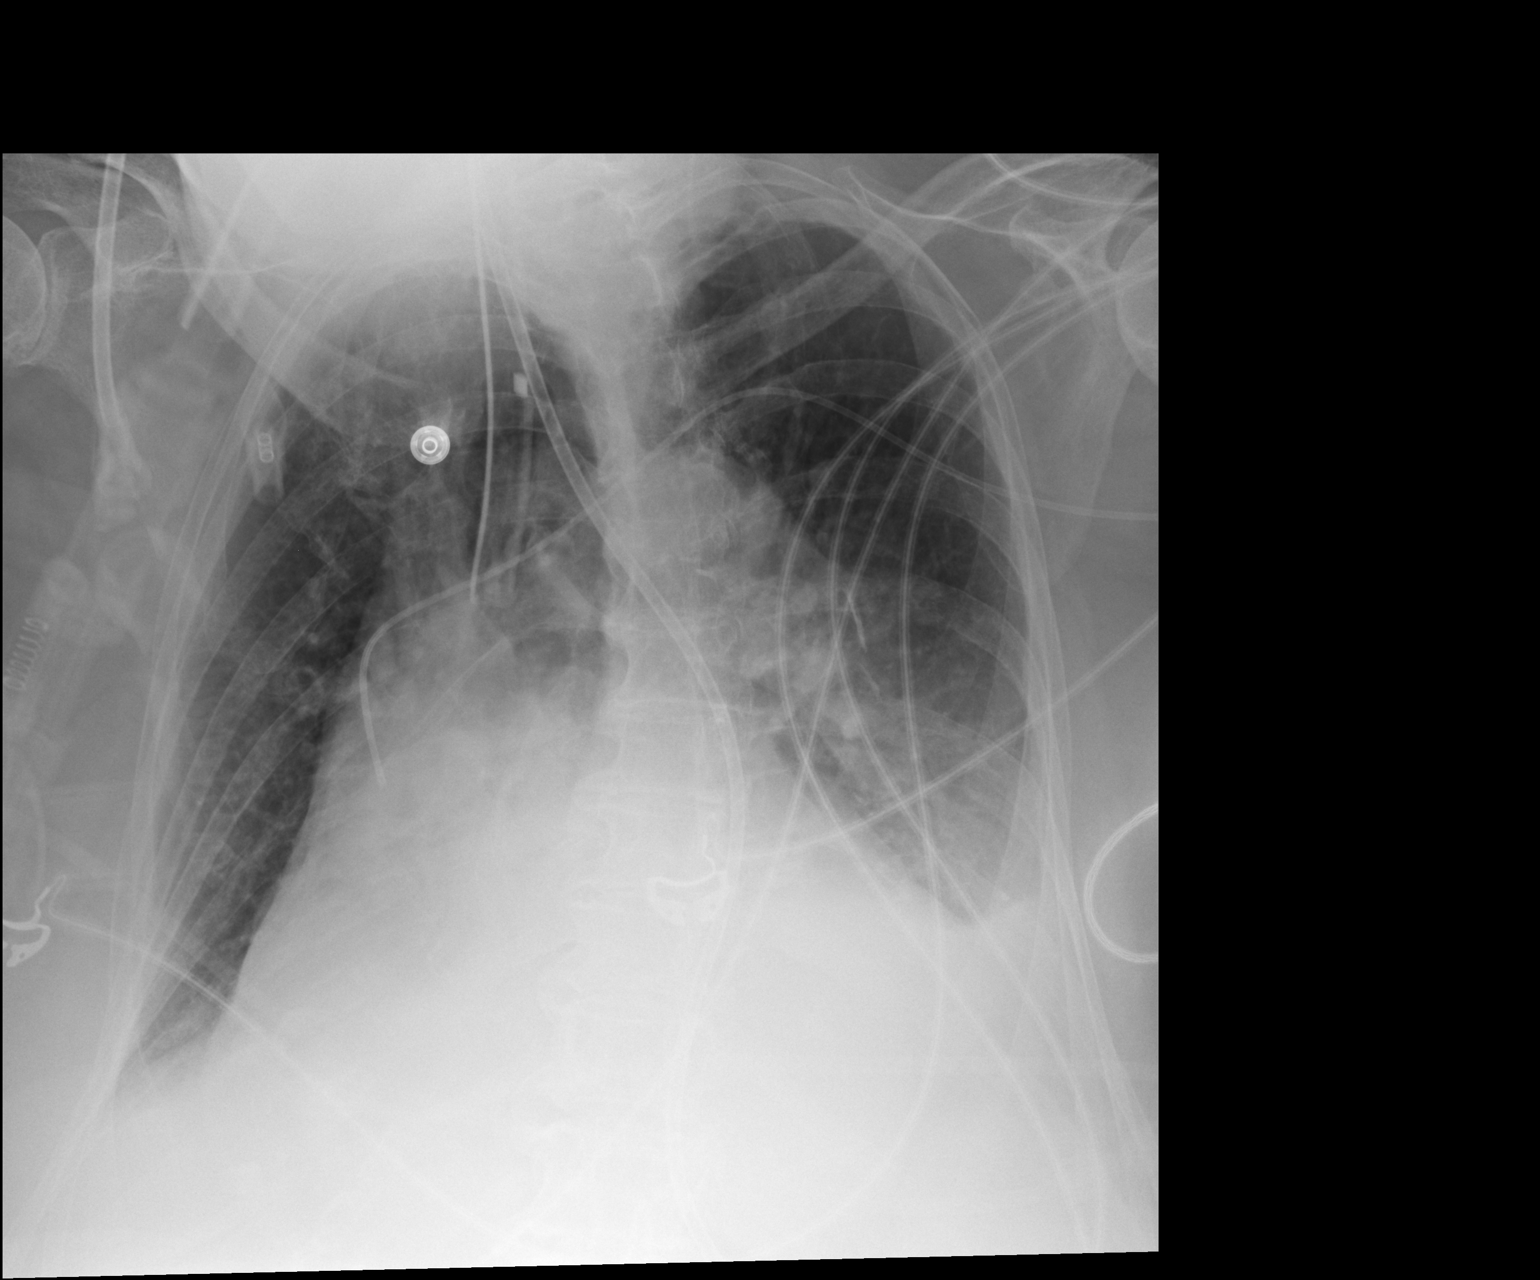

[1 of 1 positions shown; findings below may reference images not displayed]

FINDINGS: Endotracheal tube 2 cm above the carina unchanged. Central venous
catheter tip in the SVC unchanged. No pneumothorax

Bibasilar atelectasis and small effusions unchanged.  No edema.
IMPRESSION: Bibasilar atelectasis and  small pleural effusions unchanged.

Endotracheal tube 2 cm above the carina.
